# Patient Record
Sex: Female | Born: 1985 | Race: White | Hispanic: No | Marital: Married | Smoking: Never smoker
Health system: Southern US, Community
[De-identification: ages and names within clinical notes are randomized; demographics above are authoritative.]

## PROBLEM LIST (undated history)

## (undated) DIAGNOSIS — G4730 Sleep apnea, unspecified: Secondary | ICD-10-CM

## (undated) DIAGNOSIS — E639 Nutritional deficiency, unspecified: Secondary | ICD-10-CM

## (undated) DIAGNOSIS — F32A Depression, unspecified: Secondary | ICD-10-CM

## (undated) DIAGNOSIS — R7303 Prediabetes: Secondary | ICD-10-CM

## (undated) DIAGNOSIS — F411 Generalized anxiety disorder: Secondary | ICD-10-CM

## (undated) DIAGNOSIS — M1990 Unspecified osteoarthritis, unspecified site: Secondary | ICD-10-CM

## (undated) DIAGNOSIS — M549 Dorsalgia, unspecified: Secondary | ICD-10-CM

## (undated) DIAGNOSIS — Z87820 Personal history of traumatic brain injury: Secondary | ICD-10-CM

## (undated) DIAGNOSIS — K219 Gastro-esophageal reflux disease without esophagitis: Secondary | ICD-10-CM

## (undated) DIAGNOSIS — G40B09 Juvenile myoclonic epilepsy, not intractable, without status epilepticus: Secondary | ICD-10-CM

## (undated) DIAGNOSIS — Z8669 Personal history of other diseases of the nervous system and sense organs: Secondary | ICD-10-CM

## (undated) DIAGNOSIS — G43909 Migraine, unspecified, not intractable, without status migrainosus: Secondary | ICD-10-CM

## (undated) DIAGNOSIS — N979 Female infertility, unspecified: Secondary | ICD-10-CM

## (undated) DIAGNOSIS — F909 Attention-deficit hyperactivity disorder, unspecified type: Secondary | ICD-10-CM

## (undated) DIAGNOSIS — I10 Essential (primary) hypertension: Secondary | ICD-10-CM

## (undated) DIAGNOSIS — L659 Nonscarring hair loss, unspecified: Secondary | ICD-10-CM

## (undated) DIAGNOSIS — E282 Polycystic ovarian syndrome: Secondary | ICD-10-CM

## (undated) DIAGNOSIS — M2550 Pain in unspecified joint: Secondary | ICD-10-CM

## (undated) DIAGNOSIS — G40909 Epilepsy, unspecified, not intractable, without status epilepticus: Secondary | ICD-10-CM

## (undated) DIAGNOSIS — F329 Major depressive disorder, single episode, unspecified: Secondary | ICD-10-CM

## (undated) DIAGNOSIS — F419 Anxiety disorder, unspecified: Secondary | ICD-10-CM

## (undated) HISTORY — DX: Attention-deficit hyperactivity disorder, unspecified type: F90.9

## (undated) HISTORY — PX: TONSILLECTOMY: SUR1361

## (undated) HISTORY — DX: Sleep apnea, unspecified: G47.30

## (undated) HISTORY — PX: WISDOM TOOTH EXTRACTION: SHX21

## (undated) HISTORY — DX: Nutritional deficiency, unspecified: E63.9

## (undated) HISTORY — DX: Depression, unspecified: F32.A

## (undated) HISTORY — DX: Dorsalgia, unspecified: M54.9

## (undated) HISTORY — DX: Pain in unspecified joint: M25.50

## (undated) HISTORY — PX: OTHER SURGICAL HISTORY: SHX169

## (undated) HISTORY — DX: Migraine, unspecified, not intractable, without status migrainosus: G43.909

## (undated) HISTORY — DX: Prediabetes: R73.03

## (undated) HISTORY — DX: Female infertility, unspecified: N97.9

## (undated) HISTORY — PX: ROUX-EN-Y GASTRIC BYPASS: SHX1104

## (undated) HISTORY — PX: TYMPANOSTOMY TUBE PLACEMENT: SHX32

---
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
## 2003-10-06 ENCOUNTER — Emergency Department (HOSPITAL_COMMUNITY): Admission: EM | Admit: 2003-10-06 | Discharge: 2003-10-06 | Payer: Self-pay | Admitting: Family Medicine

## 2004-06-21 ENCOUNTER — Emergency Department (HOSPITAL_COMMUNITY): Admission: EM | Admit: 2004-06-21 | Discharge: 2004-06-21 | Payer: Self-pay | Admitting: Family Medicine

## 2004-06-24 ENCOUNTER — Emergency Department (HOSPITAL_COMMUNITY): Admission: EM | Admit: 2004-06-24 | Discharge: 2004-06-24 | Payer: Self-pay | Admitting: Family Medicine

## 2004-08-10 ENCOUNTER — Inpatient Hospital Stay (HOSPITAL_COMMUNITY): Admission: AD | Admit: 2004-08-10 | Discharge: 2004-08-10 | Payer: Self-pay | Admitting: Obstetrics and Gynecology

## 2005-02-05 ENCOUNTER — Ambulatory Visit (HOSPITAL_COMMUNITY): Admission: RE | Admit: 2005-02-05 | Discharge: 2005-02-05 | Payer: Self-pay | Admitting: Family Medicine

## 2006-12-28 ENCOUNTER — Emergency Department (HOSPITAL_COMMUNITY): Admission: EM | Admit: 2006-12-28 | Discharge: 2006-12-28 | Payer: Self-pay | Admitting: Emergency Medicine

## 2007-02-01 ENCOUNTER — Emergency Department (HOSPITAL_COMMUNITY): Admission: EM | Admit: 2007-02-01 | Discharge: 2007-02-01 | Payer: Self-pay | Admitting: Emergency Medicine

## 2007-03-07 ENCOUNTER — Ambulatory Visit: Payer: Self-pay | Admitting: Pulmonary Disease

## 2007-03-22 ENCOUNTER — Ambulatory Visit: Payer: Self-pay | Admitting: Pulmonary Disease

## 2007-03-23 ENCOUNTER — Ambulatory Visit: Payer: Self-pay | Admitting: Cardiology

## 2007-03-30 ENCOUNTER — Ambulatory Visit: Payer: Self-pay | Admitting: Pulmonary Disease

## 2007-04-03 ENCOUNTER — Ambulatory Visit: Payer: Self-pay | Admitting: Pulmonary Disease

## 2007-05-08 DIAGNOSIS — J309 Allergic rhinitis, unspecified: Secondary | ICD-10-CM | POA: Insufficient documentation

## 2007-05-08 DIAGNOSIS — J45909 Unspecified asthma, uncomplicated: Secondary | ICD-10-CM | POA: Insufficient documentation

## 2007-05-08 DIAGNOSIS — I10 Essential (primary) hypertension: Secondary | ICD-10-CM | POA: Insufficient documentation

## 2007-05-08 DIAGNOSIS — K219 Gastro-esophageal reflux disease without esophagitis: Secondary | ICD-10-CM | POA: Insufficient documentation

## 2007-05-09 ENCOUNTER — Ambulatory Visit: Payer: Self-pay | Admitting: Pulmonary Disease

## 2007-05-09 DIAGNOSIS — R059 Cough, unspecified: Secondary | ICD-10-CM | POA: Insufficient documentation

## 2007-05-09 DIAGNOSIS — R05 Cough: Secondary | ICD-10-CM

## 2007-06-04 ENCOUNTER — Telehealth (INDEPENDENT_AMBULATORY_CARE_PROVIDER_SITE_OTHER): Payer: Self-pay | Admitting: *Deleted

## 2007-06-04 ENCOUNTER — Ambulatory Visit: Payer: Self-pay | Admitting: Pulmonary Disease

## 2007-06-04 DIAGNOSIS — J019 Acute sinusitis, unspecified: Secondary | ICD-10-CM | POA: Insufficient documentation

## 2007-06-13 ENCOUNTER — Telehealth (INDEPENDENT_AMBULATORY_CARE_PROVIDER_SITE_OTHER): Payer: Self-pay | Admitting: *Deleted

## 2007-06-18 ENCOUNTER — Ambulatory Visit: Payer: Self-pay | Admitting: Pulmonary Disease

## 2007-07-04 ENCOUNTER — Encounter (INDEPENDENT_AMBULATORY_CARE_PROVIDER_SITE_OTHER): Payer: Self-pay | Admitting: *Deleted

## 2008-03-27 ENCOUNTER — Emergency Department (HOSPITAL_COMMUNITY): Admission: EM | Admit: 2008-03-27 | Discharge: 2008-03-27 | Payer: Self-pay | Admitting: Emergency Medicine

## 2010-04-25 ENCOUNTER — Emergency Department (HOSPITAL_COMMUNITY)
Admission: EM | Admit: 2010-04-25 | Discharge: 2010-04-25 | Payer: Self-pay | Source: Home / Self Care | Admitting: Emergency Medicine

## 2010-06-27 ENCOUNTER — Inpatient Hospital Stay (INDEPENDENT_AMBULATORY_CARE_PROVIDER_SITE_OTHER)
Admission: RE | Admit: 2010-06-27 | Discharge: 2010-06-27 | Disposition: A | Discharge status: Home / Self Care | Payer: Self-pay | Source: Ambulatory Visit | Attending: Emergency Medicine | Admitting: Emergency Medicine

## 2010-06-27 DIAGNOSIS — J029 Acute pharyngitis, unspecified: Secondary | ICD-10-CM

## 2010-06-27 DIAGNOSIS — J40 Bronchitis, not specified as acute or chronic: Secondary | ICD-10-CM

## 2010-07-19 LAB — POCT PREGNANCY, URINE: Preg Test, Ur: NEGATIVE

## 2010-09-21 NOTE — Assessment & Plan Note (Signed)
[name]                             PULMONARY OFFICE NOTE   NAME:[name], Brooke LACKMAN                    MRN:          [id number]  DATE:03/30/2007                            DOB:          1985-05-23    HISTORY OF PRESENT ILLNESS:  Patient is a 25 year old white female  patient of Dr. Evlyn [name] who was recently seen for pulmonary evaluation of  asthma.  Patient was felt to have some rhinitis symptoms.  She was given  a 7-day course of Augmentin and started on Nasacort AQ and Asmanex.  Patient reports her symptoms did improve, however continued to have a  persistent cough.  Patient was sent for a CT scan of the sinuses on  November 14 which was unremarkable.  She had been placed on an  aggressive cough suppression regimen including Mucinex DM and Tramadol.  She was placed on Zyrtec 10 mg at bedtime and Prilosec was increased up  to twice a day.  Patient returns back today reporting that she had  significant improvement in symptoms initially with total resolution of  cough until 2 days ago when cough started to return and now with severe  coughing paroxysms.  Patient complains of some mild stuffy nose and  postnasal drip symptoms.   PAST MEDICAL HISTORY:  Reviewed.   CURRENT MEDICATIONS:  Reviewed.   PHYSICAL EXAMINATION:  Patient is a pleasant female in no acute  distress.  She is afebrile with stable vital signs.  O2 saturation is  96% on room air.  HEENT:  Nasal mucosa is some with some mild erythema, nontender sinuses,  posterior pharynx is clear.  NECK:  Supple without cervical adenopathy.  No JVD.  LUNGS:  Sounds are clear.  CARDIAC:  Regular rate.  ABDOMEN:  Soft and nontender.  EXTREMITIES:  Warm without any edema.   DATA:  Chest x-ray shows as unremarkable.   IMPRESSION AND PLAN:  Recurrent cough with underlying asthma and  rhinitis.  Patient suspected to have some upper airway instability,  possibly secondary to postnasal drip syndrome  and/or reflux.  Patient  will add in Pepcid AC at bedtime, continue on Prilosec twice  daily.  Begin a prednisone taper and continue on aggressive cough  suppression regimen.  Patient will return back in 2 weeks with Dr. Craige [name]  or sooner if needed.      Brooke Oaks, NP  Electronically Signed      Coralyn Helling, MD  Electronically Signed   TP/MedQ  DD: 04/02/2007  DT: 04/02/2007  Job #: [id number]

## 2010-09-21 NOTE — Assessment & Plan Note (Signed)
[facility]                             PULMONARY OFFICE NOTE   NAME:Brooke [name], Brooke [name]                    MRN:          [id number]  DATE:03/07/2007                            DOB:          12-27-1985    I met Ms. Brooke [name] today for evaluation of her asthmatic bronchitis.   She says that her symptoms started in March of 2008 when she developed  bronchitis.  She was prescribed a cough medicine initially, which was  not beneficial.  This progressed to the point where she was seen in an  urgent care center in August.  She was given a course of antibiotics,  which did help clear things up somewhat, but then her symptoms recurred.  She was then seen in September at the urgent care center again.  She was  given another course of antibiotics, prednisone, and albuterol.  She  said that, when she was on the albuterol and the prednisone, her  symptoms improved, and the albuterol seemed to help, but her symptoms  have gotten worse again, and she does not feel like the albuterol is  helping with the symptoms any more.  She currently complains of nasal  congestion, rhinitis, and post-nasal drip.  She is having a yellowish  discharge.  She also complains of a globus sensation in her throat.  She  does also complain of cough to the point where she feels like she is  going to vomit.  She has sputum production, which is greenish in color.  She also complains of wheezing and chest tightness.  She says that she  has been running a low grade temperature of 99.9.  She denies any chills  or sweats.  She denies any hemoptysis.  She has not had any recent sick  exposures.  There is no recent travel history.  She does own a dog and a  cat, but has had these for several years.  She has never had any  problems previously with allergy symptoms.  She denies any problems as  far as taking aspirin.  She does have a history of reflux, and is on  Prilosec for this.  She denies any  joint pain or swelling.  She says  that she will get a reddish rash over her chest occasionally, but this  is intermittent.   PAST MEDICAL HISTORY:  Significant for previous episode of pneumonia,  elevated blood pressure, epilepsy, gastroesophageal reflux disease.   PAST SURGICAL HISTORY:  Significant for a tonsillectomy.  She also had  ear tubes placed when she was a child.   CURRENT MEDICATIONS:  Prilosec.  Lamictal.  Depakote.  Albuterol.   She has no known drug allergies.   SOCIAL HISTORY:  She is single.  She lives with her mother.  She works  as a Biochemist, clinical.  She has no children.  She does have 1 cat and 2  dogs.  She denies any alcohol use or cigarette use.  She says she will  occasionally smoke marijuana, but has not done so since September.  She  denies any other drug use.   FAMILY  HISTORY:  Significant for a mother who has hypertension and  diabetes.   REVIEW OF SYSTEMS:  She complains of feeling depressed due to her health  problems over the last several months.   PHYSICAL EXAM:  She is 5 feet 5 inches tall, 239 pounds.  Temperature is  98.5, blood pressure 140/80, heart rate is 97, oxygen saturation 98% on  room air.  HEENT:  Pupils are reactive.  There is no sinus tenderness.  She has  boggy nasal mucosa with clear to yellowish discharge.  There is mild  erythema to the posterior pharynx.  There is no lymphadenopathy.  No thyromegaly.  HEART:  S1, S2.  Regular rate and rhythm.  CHEST:  She has prolonged expiratory phase with bilateral expiratory  wheezes heard.  ABDOMEN:  Obese, soft, and nontender.  EXTREMITIES:  No cyanosis, clubbing, or edema.  NEUROLOGIC:  No focal deficits appreciated.   IMPRESSION:  1. She has symptoms suggestive of asthma as well as rhinitis, post-      nasal drip, and sinusitis.  I will give her an additional course of      antibiotics with Augmentin 500 mg 1 p.o. b.i.d. for 7 days with 1      refill depending upon her symptom  response.  I will also start her      on Nasacort AQ 2 sprays each nostril once daily as well as nasal      irrigation.  Depending upon her response to this, I will decide if      she will need to have any imaging studies of her sinuses done.  I      will also give her a short course of prednisone over the next 5      days, as well as starting her on Asmanex 220 mcg 2 puffs nightly.      She is also to continue on her albuterol as needed.  I have also      given her a peak flow meter to monitor her symptom status.  She did      have a previous x-ray done in August of 2008, which showed chronic      bronchitic changes, but was otherwise unremarkable.  2. Gastroesophageal reflux disease.  She is to continue on her      Prilosec.  3. Hypertension.  She is to follow up with her primary care physician      for this.  4. Epilepsy.  She is due to follow up with neurology for this.  5. Obesity.  She will need to have further counseling in regard to      this.   I will follow up with her in 3 to 4 weeks.     Coralyn Helling, MD  Electronically Signed    VS/MedQ  DD: 03/07/2007  DT: 03/07/2007  Job #: [id number]   cc:   Deanna Artis. Sharene Skeans, M.D.

## 2010-09-21 NOTE — Assessment & Plan Note (Signed)
[facility]                             PULMONARY OFFICE NOTE   NAME:[name], Brooke ENTERLINE                    MRN:          [id number]  DATE:04/03/2007                            DOB:          13-Feb-1986    I saw Brooke [name] today in followup for her asthma with rhinitis and  reflux.   Since she was last seen by me in October, she has seen my nurse  practitioner twice.  She has had a CT scan of her sinuses, which was  unremarkable.  She also had a chest x-ray which was unremarkable.  She  had been started on several medications to help with her cough, as well  as having her reflux medicines increased.  She was also given another  course of prednisone.  She says that since being started on tramadol,  she has noticed that she has been having more trouble with control of  her seizures.  She does complain of continued problems with nasal  congestion and paroxysmal nocturnal dyspnea.  She is not using her nasal  irrigation as I had prescribed.  She feels like the Symbicort has helped  with her breathing.  She says that she feels like the Zyrtec has also  helped with some of her sinus symptoms as well.   CURRENT MEDICATIONS:  1. Asmanex 2 puffs nightly.  2. Prilosec 20 mg b.i.d.  3. Zyrtec 10 mg nightly.  4. Nasacort AQ 1 spray b.i.d.  5. Lamictal 150 mg in the morning and 200 mg at night.  6. Valproic acid 125 mg b.i.d.  7. Prednisone taper.  8. Pepcid AC nightly.  9. Albuterol as needed.  10.Mucinex DM b.i.d.  11.Tramadol 50 mg as needed.   PHYSICAL EXAM:  She is 234 pounds, temperature is 98.9, blood pressure  is 122/60, heart rate is 100, oxygen saturation is 96% on room air.  HEENT:  There is no sinus tenderness.  She has a clear nasal discharge.  There are no oral lesions.  No lymphadenopathy.  HEART:  S1, S2.  CHEST:  There was no wheezes or rales.  ABDOMEN:  Soft, nontender.  EXTREMITIES:  No edema.   IMPRESSION:  Refractory asthma with  rhinitis and reflux.  What I will do  is have her discontinue her Asmanex and Zyrtec.  I will also have her  discontinue her tramadol.  I will start her Symbicort 160/4.5 two puffs  b.i.d., and I will also start her on Singulair 10 mg nightly.  I will  start her on Astelin 2 sprays in each nostril twice daily.  She is also  to discontinue the use of tramadol and use Delsym once to twice a day.  I will have her continue on her reflux medications with Prilosec and  Pepcid.  She is to complete her tapering course of prednisone.  I would  also have  her continue on her Nasacort and Mucinex.  I will then follow up with  her in approximately 3-4 weeks to see if any further adjustments will  need to be made.     Coralyn Helling, MD  Electronically Signed    VS/MedQ  DD: 04/03/2007  DT: 04/03/2007  Job #: [id number]   cc:   Deanna Artis. Sharene Skeans, M.D.

## 2010-09-21 NOTE — Assessment & Plan Note (Signed)
[facility]                             PULMONARY OFFICE NOTE   NAME:Brooke [name], Brooke [name]                    MRN:          [id number]  DATE:03/22/2007                            DOB:          [birth year]    HISTORY OF PRESENT ILLNESS:  The patient is a 25 year old white female  patient of Dr. Evlyn Courier who was recently seen for evaluation of asthmatic  bronchitis two weeks ago. The patient was suspected to have asthma as  well as rhinitis and sinusitis. The patient was given a seven day course  of Augmentin and started on Nasacort AQ and Asmanex 220. The patient  returns today and reports that she did not have any further symptoms and  has had decreased wheezing, but cough continues. The patient complains  that she has had a cough over the last 8 months. She has been on three  courses of antibiotics and two prednisone tapers. The patient has  recently finished the prednisone taper one week ago. The patient denies  any hemoptysis, orthopnea, PND or leg swelling. 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 She has severe coughing paroxysms throughout the day and at  nighttime. The patient is on reflux medications, but has breakthrough  reflux despite Prilosec daily.   PAST MEDICAL HISTORY:  Reviewed.   CURRENT MEDICATIONS:  Reviewed.   PHYSICAL EXAMINATION:  The patient is a morbidly obese female in no  acute distress. She is afebrile with stable vital signs. O2 saturation  is 95% on room air.  HEENT: Nasal mucosa is erythematous. Nontender sinuses. Conjunctivae not  injected. Tympanic membranes are normal. Posterior pharynx is clear.  NECK: Supple without adenopathy. No JVD.  LUNGS: Lung sounds are clear without any wheezing or crackles.  CARDIAC: Regular rate.  ABDOMEN: Soft, morbidly obese and nontender.  EXTREMITIES: Warm without edema.   IMPRESSION/PLAN:  Chronic cough with underlying  asthma and rhinitis.  Will check a CT of the sinuses to rule out underlying sinus disease. The  patient will begin on Mucinex DM twice daily. Add in tramadol 50 mg  every 4 hours as needed for breakthrough coughing. Add in Zyrtec 10 mg  at bedtime for post-nasal drip symptoms. The patient is to use a cough  suppressant regimen with sugarless candy, non-mint lozenges, ice chips  and water to prevent coughing and will increase her Nasacort AQ up to  one puff twice daily, rinse well after Asmanex. Increase her Prilosec up  to twice a day before breakfast and before dinner. She will return back  here in one  week for followup with Dr. Craige Cotta or sooner if needed. The patient's FMLA  forms were filled out today.      Rubye Oaks, NP  Electronically Signed      Coralyn Helling, MD  Electronically Signed   TP/MedQ  DD: 03/22/2007  DT: 03/22/2007  Job #: [id number]

## 2011-02-08 LAB — URINALYSIS, ROUTINE W REFLEX MICROSCOPIC
Glucose, UA: NEGATIVE
Nitrite: NEGATIVE
Urobilinogen, UA: 0.2

## 2011-02-08 LAB — URINE MICROSCOPIC-ADD ON

## 2011-02-18 LAB — POCT RAPID STREP A: Streptococcus, Group A Screen (Direct): NEGATIVE

## 2011-03-16 ENCOUNTER — Emergency Department (INDEPENDENT_AMBULATORY_CARE_PROVIDER_SITE_OTHER): Payer: Self-pay

## 2011-03-16 ENCOUNTER — Emergency Department (INDEPENDENT_AMBULATORY_CARE_PROVIDER_SITE_OTHER)
Admission: EM | Admit: 2011-03-16 | Discharge: 2011-03-16 | Disposition: A | Discharge status: Home / Self Care | Payer: Self-pay | Source: Home / Self Care | Attending: Family Medicine | Admitting: Family Medicine

## 2011-03-16 ENCOUNTER — Encounter: Payer: Self-pay | Admitting: *Deleted

## 2011-03-16 DIAGNOSIS — N898 Other specified noninflammatory disorders of vagina: Secondary | ICD-10-CM

## 2011-03-16 DIAGNOSIS — R109 Unspecified abdominal pain: Secondary | ICD-10-CM

## 2011-03-16 DIAGNOSIS — N939 Abnormal uterine and vaginal bleeding, unspecified: Secondary | ICD-10-CM

## 2011-03-16 HISTORY — DX: Essential (primary) hypertension: I10

## 2011-03-16 LAB — POCT PREGNANCY, URINE: Preg Test, Ur: NEGATIVE

## 2011-03-16 LAB — POCT URINALYSIS DIP (DEVICE)
Bilirubin Urine: NEGATIVE
Glucose, UA: NEGATIVE mg/dL
Ketones, ur: NEGATIVE mg/dL
Nitrite: NEGATIVE
Protein, ur: NEGATIVE mg/dL
Specific Gravity, Urine: 1.015 (ref 1.005–1.030)
pH: 5.5 (ref 5.0–8.0)

## 2011-03-16 MED ORDER — DICYCLOMINE HCL 20 MG PO TABS: 20.0000 mg | ORAL_TABLET | Freq: Four times a day (QID) | ORAL | Status: DC

## 2011-03-16 NOTE — ED Provider Notes (Signed)
History     CSN: [id number] Arrival date & time: 03/16/2011  2:08 PM   First MD Initiated Contact with Patient 03/16/11 1515      Chief Complaint  Patient presents with  . Abdominal Pain    (Consider location/radiation/quality/duration/timing/severity/associated sxs/prior treatment) HPI Comments: Onset of abd pain one week ago. Pain is intermittent, sharp, generalized and Rt flank. Pain lasts up to 20 minutes each episode. Had a severe episode this afternoon and felt like she might pass out from the pain. Has had nausea, but no vomiting. BMs have been normal until this morning when she had one loose stool. No blood. No dysuira or urinary frequency. On menses this week. Menses irrg. Had one 2 weeks ago also. Bleeding is typical and not heavy. No clots. No vag discharge. Has taken Advil for pain w/o relief.   Patient is a 25 y.o. female presenting with abdominal pain. The history is provided by the patient.  Abdominal Pain The primary symptoms of the illness include abdominal pain, nausea, diarrhea (one loose stool this morning) and vaginal bleeding. The primary symptoms of the illness do not include fever, shortness of breath, vomiting, dysuria or vaginal discharge. The current episode started more than 2 days ago (3 days ago). The onset of the illness was sudden. The problem has not changed since onset. The abdominal pain is generalized (generalized and Rt flank). The abdominal pain is relieved by nothing.  Vaginal bleeding was first noticed more than 2 days ago. Vaginal bleeding other than menses is a recurrent problem. Vaginal bleeding is unchanged since it began. The quantity of blood was typical of menses.  The patient states that she believes she is currently not pregnant.    Past Medical History  Diagnosis Date  . Hypertension     Past Surgical History  Procedure Date  . Tonsillectomy     Family History  Problem Relation Age of Onset  . Diabetes Mother   . Heart failure  Mother   . Hypertension Mother   . Hypertension Father     History  Substance Use Topics  . Smoking status: Never Smoker   . Smokeless tobacco: Not on file  . Alcohol Use: No    OB History    Grav Para Term Preterm Abortions TAB SAB Ect Mult Living                  Review of Systems  Constitutional: Negative for fever.  Respiratory: Negative for shortness of breath.   Gastrointestinal: Positive for nausea, abdominal pain and diarrhea (one loose stool this morning). Negative for vomiting.  Genitourinary: Positive for vaginal bleeding. Negative for dysuria and vaginal discharge.    Allergies  Codeine  Home Medications   Current Outpatient Rx  Name Route Sig Dispense Refill  . SPIRONOLACTONE 25 MG PO TABS Oral Take 25 mg by mouth daily.        BP 146/88  Pulse 81  Temp(Src) 98.3 F (36.8 C) (Oral)  Resp 20  SpO2 100%  Physical Exam  Nursing note and vitals reviewed. Constitutional: She appears well-developed and well-nourished. No distress.  HENT:  Head: Normocephalic and atraumatic.  Right Ear: Tympanic membrane, external ear and ear canal normal.  Left Ear: Tympanic membrane, external ear and ear canal normal.  Nose: Nose normal.  Mouth/Throat: Oropharynx is clear and moist. No oropharyngeal exudate.  Neck: Neck supple.  Cardiovascular: Normal rate, regular rhythm and normal heart sounds.   Pulmonary/Chest: Effort normal and breath sounds normal.  No respiratory distress.  Abdominal: Soft. Bowel sounds are normal. She exhibits no distension and no mass. There is no tenderness. There is no rebound and no guarding.  Lymphadenopathy:    She has no cervical adenopathy.  Neurological: She is alert. Gait normal.  Skin: Skin is warm and dry.  Psychiatric: She has a normal mood and affect.    ED Course  Procedures (including critical care time)  Labs Reviewed - No data to display No results found.   No diagnosis found.    MDM  Xray neg, reviewed by  myself and radiologist. Increased bowel gas. Urine and preg neg. Discussed with Dr Lorenza Chick.        Melody Comas, PA 03/18/11 [address] [place], [place] 03/18/11 1053

## 2011-03-16 NOTE — ED Notes (Signed)
Pt  Reports  abd pain  Started 3 days  Ago    Pt  Reports  Nausea  Here  And  There       Almost  Passed  Out  At  Work     Pt  Has  Vaginal bleeding    X  2  Days           Slight  Diarrhea     This  Am  No  Vomiting

## 2011-03-16 NOTE — ED Notes (Signed)
Pt  Reports   She  Has  irreg  Periods as well  As  Hair loss     She reports  It is  Being  addresed  Now   By a  pcp

## 2011-03-18 ENCOUNTER — Inpatient Hospital Stay (HOSPITAL_COMMUNITY)
Admission: AD | Admit: 2011-03-18 | Discharge: 2011-03-18 | Disposition: A | Discharge status: Home / Self Care | Payer: Self-pay | Source: Ambulatory Visit | Attending: Obstetrics and Gynecology | Admitting: Obstetrics and Gynecology

## 2011-03-18 ENCOUNTER — Encounter (HOSPITAL_COMMUNITY): Payer: Self-pay | Admitting: *Deleted

## 2011-03-18 DIAGNOSIS — N938 Other specified abnormal uterine and vaginal bleeding: Secondary | ICD-10-CM | POA: Insufficient documentation

## 2011-03-18 DIAGNOSIS — N949 Unspecified condition associated with female genital organs and menstrual cycle: Secondary | ICD-10-CM

## 2011-03-18 HISTORY — DX: Epilepsy, unspecified, not intractable, without status epilepticus: G40.909

## 2011-03-18 LAB — CBC
Hemoglobin: 14.4 g/dL (ref 12.0–15.0)
MCHC: 34.3 g/dL (ref 30.0–36.0)
RBC: 4.8 MIL/uL (ref 3.87–5.11)
RDW: 12.5 % (ref 11.5–15.5)
WBC: 7.4 10*3/uL (ref 4.0–10.5)

## 2011-03-18 LAB — DIFFERENTIAL
Basophils Absolute: 0 10*3/uL (ref 0.0–0.1)
Lymphs Abs: 2.1 10*3/uL (ref 0.7–4.0)
Monocytes Absolute: 0.5 10*3/uL (ref 0.1–1.0)
Monocytes Relative: 6 % (ref 3–12)
Neutrophils Relative %: 64 % (ref 43–77)

## 2011-03-18 MED ORDER — MEDROXYPROGESTERONE ACETATE 5 MG PO TABS: 5.0000 mg | ORAL_TABLET | Freq: Every day | ORAL | Status: DC

## 2011-03-18 NOTE — Progress Notes (Signed)
Pt started spotting 2 days ago, was seen @ [facility] for abd pain, yesterday was bleeding like a regular period, then this a.m. @ 0430 began having heavy bleeding.

## 2011-03-18 NOTE — Progress Notes (Signed)
Pt states hx dysmenorrhea, started bleeding this am at around ? 0430 am, changing super tampon q106minutes. Denies vag d/c changes. Lower abd cramping into back bilaterally.

## 2011-03-18 NOTE — ED Provider Notes (Signed)
History     Chief Complaint  Patient presents with  . Vaginal Bleeding   HPICharity L Riggins25 y.o. previous patient of Dr. Senaida Ores presents with heavy vaginal bleeding.  Began 2 days ago light, more yesterday and today at 4:30am began with cramps and heavy bleeding with clots, changing tampons q64m. Cannot recall LMP.   Hx of irregular bleeding sometimes spotting for a month.  Denies PCOS, has hx of elevated testosterone.  5 years ago was treated with Yaz but had abdominal pain unrelated to the bleeding so she discontinued. G0. 1 sexual partner.  Called Dr. Ambrose Mantle this am and instructed to come here.   Plans to return to practice for care.  Was seen by [facility] [place] [facility] 2 days ago with abdominal pain, different that pain today.  UA there was negative.  Hx Hypertension, untreated by her report but is on Spirolactone from Dr. Terri Piedra, dermatologist and told that would control it , elevated BP today. Hx of seizures (last seizure 3 weeks ago) without medication.  States she controls them by avoiding flickering lights and getting enough sleep.  No longer sees Dr. Sharene Skeans.     Past Medical History  Diagnosis Date  . Hypertension   . Epilepsy     Last seizure 3 weeks ago    Past Surgical History  Procedure Date  . Tonsillectomy     Family History  Problem Relation Age of Onset  . Diabetes Mother   . Heart failure Mother   . Hypertension Mother   . Hypertension Father     History  Substance Use Topics  . Smoking status: Never Smoker   . Smokeless tobacco: Not on file  . Alcohol Use: No    Allergies:  Allergies  Allergen Reactions  . Codeine Itching    Prescriptions prior to admission  Medication Sig Dispense Refill  . Biotin 1000 MCG tablet Take 1,000 mcg by mouth at bedtime as needed and may repeat dose one time if needed.        . dicyclomine (BENTYL) 20 MG tablet Take 1 tablet (20 mg total) by mouth every 6 (six) hours.  20 tablet  0  . ibuprofen (ADVIL,MOTRIN) 200 MG tablet Take  400 mg by mouth daily as needed. For pain       . Ibuprofen (MIDOL) 200 MG CAPS Take 2 capsules by mouth once.        Marland Kitchen omeprazole (PRILOSEC) 20 MG capsule Take 20 mg by mouth daily as needed. For heartburn       . spironolactone (ALDACTONE) 25 MG tablet Take 25 mg by mouth daily.          Review of Systems  Constitutional: Negative.   HENT: Negative.   Gastrointestinal: Positive for abdominal pain (cramping).  Genitourinary:       Heavy vaginal bleeding with clots   Physical Exam   Blood pressure 153/100, pulse 78, temperature 98.8 F (37.1 C), temperature source Oral, resp. rate 16, height 5\' 5"  (1.651 m), weight 231 lb 2 oz (104.838 kg), last menstrual period 03/18/2011, SpO2 97.00%.  Physical Exam  Nursing note and vitals reviewed. Constitutional: She is oriented to person, place, and time. She appears well-developed.  HENT:  Head: Normocephalic.  Neck: Normal range of motion.  Respiratory: Effort normal.  GI: Soft. She exhibits no distension and no mass. There is no tenderness. There is no rebound and no guarding.  Genitourinary: Uterus normal. Uterus is not tender. Right adnexum displays no mass, no tenderness and no  fullness. Left adnexum displays no mass, no tenderness and no fullness. There is bleeding (mod amount of bright red blood with several small clots) around the vagina.  Neurological: She is alert and oriented to person, place, and time.  Skin: Skin is warm and dry.   Results for orders placed during the hospital encounter of 03/18/11 (from the past 24 hour(s))  POCT PREGNANCY, URINE     Status: Normal   Collection Time   03/18/11  7:54 AM      Component Value Range   Preg Test, Ur NEGATIVE    WET PREP, GENITAL     Status: Abnormal   Collection Time   03/18/11  8:15 AM      Component Value Range   Yeast, Wet Prep NONE SEEN  NONE SEEN    Trich, Wet Prep NONE SEEN  NONE SEEN    Clue Cells, Wet Prep NONE SEEN  NONE SEEN    WBC, Wet Prep HPF POC FEW (*) NONE SEEN    CBC     Status: Normal   Collection Time   03/18/11  8:17 AM      Component Value Range   WBC 7.4  4.0 - 10.5 (K/uL)   RBC 4.80  3.87 - 5.11 (MIL/uL)   Hemoglobin 14.4  12.0 - 15.0 (g/dL)   HCT 84.6  96.2 - 95.2 (%)   MCV 87.5  78.0 - 100.0 (fL)   MCH 30.0  26.0 - 34.0 (pg)   MCHC 34.3  30.0 - 36.0 (g/dL)   RDW 84.1  32.4 - 40.1 (%)   Platelets 295  150 - 400 (K/uL)  DIFFERENTIAL     Status: Normal   Collection Time   03/18/11  8:17 AM      Component Value Range   Neutrophils Relative 64  43 - 77 (%)   Neutro Abs 4.7  1.7 - 7.7 (K/uL)   Lymphocytes Relative 28  12 - 46 (%)   Lymphs Abs 2.1  0.7 - 4.0 (K/uL)   Monocytes Relative 6  3 - 12 (%)   Monocytes Absolute 0.5  0.1 - 1.0 (K/uL)   Eosinophils Relative 2  0 - 5 (%)   Eosinophils Absolute 0.1  0.0 - 0.7 (K/uL)   Basophils Relative 0  0 - 1 (%)   Basophils Absolute 0.0  0.0 - 0.1 (K/uL)   MAU Course  Procedures  GC/CHL cultures to lab  MDM 9:35am paged Dr. Senaida Ores to report.  She returned the call at 9:52.  Reported patient's hx, sxs, pelvic exam and lab results.  Order given to offer patient choice of this period ending on its own vs Provera 10mg  daily X 5-7 days.  Call for appointment for followup.    I gave the patient the choice and she would like to take the Provera.  She will call for appt.  I gave her explanation of irregular bleeding, need to have periods q50months to avoid hyperplasia.  Informed that Dr. Senaida Ores will discuss at followup visit.   Assessment and Plan  A: Dysfunctional Uterine Bleeding.  P: Provera 5mg  qd X 5-7 days     Instructed patient to followup with Dr. Senaida Ores.  Brooke [name],EVE M 03/18/2011, 8:01 AM   Matt Holmes, NP 03/18/11 1012

## 2011-03-19 LAB — GC/CHLAMYDIA PROBE AMP, GENITAL: Chlamydia, DNA Probe: NEGATIVE

## 2011-03-21 NOTE — ED Provider Notes (Signed)
Medical screening examination/treatment/procedure(s) were performed by non-physician practitioner and as supervising physician I was immediately available for consultation/collaboration.  LYKINS,KIMBERLY G  D.O.    Randa Spike, MD 03/21/11 [phone]

## 2011-04-22 ENCOUNTER — Telehealth: Payer: Self-pay | Admitting: Neurology

## 2011-04-22 NOTE — Telephone Encounter (Signed)
12:00 Jan 3

## 2011-04-22 NOTE — Telephone Encounter (Signed)
Pt scheduled. Will call to cx appt if something is found sooner somewhere else.

## 2011-04-22 NOTE — Telephone Encounter (Signed)
Pt has appt scheduled on 06/03/2011 but her new job won't let her start work until she visits a neurologist because of her epilepsy. Is there anywhere we can fit her in ASAP?

## 2011-05-12 ENCOUNTER — Ambulatory Visit (INDEPENDENT_AMBULATORY_CARE_PROVIDER_SITE_OTHER): Payer: Self-pay | Admitting: Neurology

## 2011-05-12 ENCOUNTER — Encounter: Payer: Self-pay | Admitting: Neurology

## 2011-05-12 VITALS — BP 120/80 | HR 92 | Ht 65.0 in | Wt 231.0 lb

## 2011-05-12 DIAGNOSIS — G40309 Generalized idiopathic epilepsy and epileptic syndromes, not intractable, without status epilepticus: Secondary | ICD-10-CM

## 2011-05-12 DIAGNOSIS — G40B09 Juvenile myoclonic epilepsy, not intractable, without status epilepticus: Secondary | ICD-10-CM | POA: Insufficient documentation
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
## 2011-06-03 ENCOUNTER — Ambulatory Visit: Payer: Self-pay | Admitting: Neurology

## 2011-08-03 ENCOUNTER — Emergency Department (INDEPENDENT_AMBULATORY_CARE_PROVIDER_SITE_OTHER)
Admission: EM | Admit: 2011-08-03 | Discharge: 2011-08-03 | Disposition: A | Discharge status: Home / Self Care | Payer: 59 | Source: Home / Self Care | Attending: Family Medicine | Admitting: Family Medicine

## 2011-08-03 ENCOUNTER — Encounter (HOSPITAL_COMMUNITY): Payer: Self-pay | Admitting: *Deleted

## 2011-08-03 DIAGNOSIS — A084 Viral intestinal infection, unspecified: Secondary | ICD-10-CM

## 2011-08-03 DIAGNOSIS — A09 Infectious gastroenteritis and colitis, unspecified: Secondary | ICD-10-CM

## 2011-08-03 HISTORY — DX: Polycystic ovarian syndrome: E28.2

## 2011-08-03 MED ORDER — ONDANSETRON 4 MG PO TBDP
ORAL_TABLET | ORAL | Status: AC
  Filled 2011-08-03: qty 1

## 2011-08-03 MED ORDER — ONDANSETRON 4 MG PO TBDP
4.0000 mg | ORAL_TABLET | Freq: Once | ORAL | Status: AC
  Administered 2011-08-03: 4 mg via ORAL

## 2011-08-03 MED ORDER — ONDANSETRON HCL 8 MG PO TABS: 8.0000 mg | ORAL_TABLET | Freq: Three times a day (TID) | ORAL | Status: AC | PRN

## 2011-08-03 NOTE — Discharge Instructions (Signed)
Take medications (ondansetron for nausea) as instructed. Consume clear liquids such as water, Sprite, gingerale, light juices for the next 12 to 24 hours, then advance as tolerated to [employer] (bananas, rice, applesauce, toast) and bland things such as soup, crackers, etc. Stop taking medications and return if any blood in stool or any fevers, or you are unable to eat or drink.

## 2011-08-03 NOTE — ED Notes (Signed)
Pt with c/o flu like symptoms - diarrhea Sunday resolved continues with aching all over/nausea/chills/fever - onset of symptoms Sunday

## 2011-08-03 NOTE — ED Provider Notes (Signed)
History     CSN: [id number]  Arrival date & time 08/03/11  1534   First MD Initiated Contact with Patient 08/03/11 1604      Chief Complaint  Patient presents with  . Nausea  . Generalized Body Aches  . Fever    (Consider location/radiation/quality/duration/timing/severity/associated sxs/prior treatment) HPI Comments: Brooke [name] presents for evaluation of body aches, persistent, vomiting, and low-grade fever. She reports several episodes of diarrhea on Sunday that has since resolved. She is tolerating by mouth well. She also reports, body aches, as well. She reports sick contacts at her job.   Patient is a 26 y.o. female presenting with vomiting. The history is provided by the patient.  Emesis  This is a new problem. The current episode started 2 days ago. The problem occurs 5 to 10 times per day. The problem has not changed since onset.The maximum temperature recorded prior to her arrival was 100 to 100.9 F. Associated symptoms include abdominal pain, chills, diarrhea, a fever and myalgias.    Past Medical History  Diagnosis Date  . Hypertension   . Epilepsy     Last seizure 3 weeks ago  . Polycystic disease, ovaries     Past Surgical History  Procedure Date  . Tonsillectomy     Family History  Problem Relation Age of Onset  . Diabetes Mother   . Heart failure Mother   . Hypertension Mother   . Hypertension Father     History  Substance Use Topics  . Smoking status: Never Smoker   . Smokeless tobacco: Never Used  . Alcohol Use: No    OB History    Grav Para Term Preterm Abortions TAB SAB Ect Mult Living   0               Review of Systems  Constitutional: Positive for fever and chills.  HENT: Negative.   Eyes: Negative.   Respiratory: Negative.   Cardiovascular: Negative.   Gastrointestinal: Positive for vomiting, abdominal pain and diarrhea.  Genitourinary: Negative.   Musculoskeletal: Positive for myalgias.  Skin: Negative.   Neurological: Negative.      Allergies  Codeine  Home Medications   Current Outpatient Rx  Name Route Sig Dispense Refill  . BIOTIN 1000 MCG PO TABS Oral Take 1,000 mcg by mouth at bedtime as needed and may repeat dose one time if needed.      . IBUPROFEN 200 MG PO TABS Oral Take 400 mg by mouth daily as needed. For pain     . OMEPRAZOLE 20 MG PO CPDR Oral Take 20 mg by mouth daily as needed. For heartburn     . PRENATE ELITE 90-600-400 MG-MCG-MCG PO TABS Oral Take 1 tablet by mouth daily.      Marland Kitchen DICYCLOMINE HCL 20 MG PO TABS Oral Take 1 tablet (20 mg total) by mouth every 6 (six) hours. 20 tablet 0  . IBUPROFEN 200 MG PO CAPS Oral Take 2 capsules by mouth once.      Marland Kitchen MEDROXYPROGESTERONE ACETATE 5 MG PO TABS Oral Take 1 tablet (5 mg total) by mouth daily. 7 tablet 0  . ONDANSETRON HCL 8 MG PO TABS Oral Take 1 tablet (8 mg total) by mouth every 8 (eight) hours as needed for nausea. 15 tablet 0  . SPIRONOLACTONE 25 MG PO TABS Oral Take 25 mg by mouth daily.        BP 139/90  Pulse 97  Temp(Src) 100.6 F (38.1 C) (Oral)  Resp 18  SpO2 96%  LMP 06/15/2011  Physical Exam  Nursing note and vitals reviewed. Constitutional: She is oriented to person, place, and time. She appears well-developed and well-nourished.  HENT:  Head: Normocephalic and atraumatic.  Eyes: EOM are normal.  Neck: Normal range of motion.  Cardiovascular: Normal rate and regular rhythm.   Pulmonary/Chest: Effort normal and breath sounds normal. She has no decreased breath sounds. She has no wheezes. She has no rhonchi.  Abdominal: Soft. Normal appearance and bowel sounds are normal. There is tenderness in the left upper quadrant.  Musculoskeletal: Normal range of motion.  Neurological: She is alert and oriented to person, place, and time.  Skin: Skin is warm and dry.  Psychiatric: Her behavior is normal.    ED Course  Procedures (including critical care time)  Labs Reviewed - No data to display No results found.   1. Viral  gastroenteritis       MDM  She was given 8 milligrams ondansetron [facility] clinic. This was given is 2 doses of 4 mg. She is still nauseous after the first 4 mg. Therefore, second dose was administered. After this. She felt well and tolerated by mouth. She's given a prescription for home.         Renaee Munda, MD 08/03/11 2159

## 2011-11-14 ENCOUNTER — Encounter (HOSPITAL_COMMUNITY): Payer: Self-pay | Admitting: Emergency Medicine

## 2011-11-14 ENCOUNTER — Emergency Department (HOSPITAL_COMMUNITY): Payer: 59

## 2011-11-14 ENCOUNTER — Emergency Department (HOSPITAL_COMMUNITY)
Admission: EM | Admit: 2011-11-14 | Discharge: 2011-11-14 | Disposition: A | Discharge status: Home / Self Care | Payer: 59 | Attending: Emergency Medicine | Admitting: Emergency Medicine

## 2011-11-14 DIAGNOSIS — R51 Headache: Secondary | ICD-10-CM

## 2011-11-14 DIAGNOSIS — R1110 Vomiting, unspecified: Secondary | ICD-10-CM | POA: Insufficient documentation

## 2011-11-14 MED ORDER — METOCLOPRAMIDE HCL 5 MG/ML IJ SOLN
10.0000 mg | Freq: Once | INTRAMUSCULAR | Status: AC
  Administered 2011-11-14: 10 mg via INTRAMUSCULAR
  Filled 2011-11-14: qty 2

## 2011-11-14 MED ORDER — ONDANSETRON HCL 4 MG/2ML IJ SOLN: 4.0000 mg | Freq: Once | INTRAMUSCULAR | Status: DC

## 2011-11-14 MED ORDER — DIPHENHYDRAMINE HCL 50 MG/ML IJ SOLN
25.0000 mg | Freq: Once | INTRAMUSCULAR | Status: AC
  Administered 2011-11-14: 25 mg via INTRAMUSCULAR
  Filled 2011-11-14: qty 1

## 2011-11-14 MED ORDER — ONDANSETRON 4 MG PO TBDP
4.0000 mg | ORAL_TABLET | Freq: Once | ORAL | Status: AC
  Administered 2011-11-14: 4 mg via ORAL
  Filled 2011-11-14: qty 1

## 2011-11-14 MED ORDER — SODIUM CHLORIDE 0.9 % IV BOLUS (SEPSIS): 1000.0000 mL | Freq: Once | INTRAVENOUS | Status: DC

## 2011-11-14 MED ORDER — METOCLOPRAMIDE HCL 5 MG/ML IJ SOLN: 10.0000 mg | Freq: Once | INTRAMUSCULAR | Status: DC

## 2011-11-14 MED ORDER — DIPHENHYDRAMINE HCL 50 MG/ML IJ SOLN: 25.0000 mg | Freq: Once | INTRAMUSCULAR | Status: DC

## 2011-11-14 NOTE — ED Notes (Signed)
Pt brought back from triage; pt getting undressed and into a gown at this time; family at bedside 

## 2011-11-14 NOTE — ED Notes (Signed)
First contact with pt. Pt refused IV. MD informed.

## 2011-11-14 NOTE — ED Notes (Signed)
Pt alert x4. NAD at this time.  

## 2011-11-14 NOTE — ED Provider Notes (Signed)
History     CSN: [id number]  Arrival date & time 11/14/11  1424   First MD Initiated Contact with Patient 11/14/11 1535      Chief Complaint  Patient presents with  . Headache    (Consider location/radiation/quality/duration/timing/severity/associated sxs/prior treatment) Patient is a 26 y.o. female presenting with headaches. The history is provided by the patient.  Headache  This is a new problem. The current episode started 1 to 2 hours ago. The problem occurs constantly. The problem has not changed since onset.The headache is associated with bright light and straining. Pain location: diffuse. The quality of the pain is described as throbbing. The pain is at a severity of 10/10. The pain does not radiate. Associated symptoms include vomiting. Pertinent negatives include no fever, no palpitations, no shortness of breath and no nausea. She has tried NSAIDs for the symptoms. The treatment provided no relief.    Past Medical History  Diagnosis Date  . Hypertension   . Epilepsy     Last seizure 3 weeks ago  . Polycystic disease, ovaries     Past Surgical History  Procedure Date  . Tonsillectomy     Family History  Problem Relation Age of Onset  . Diabetes Mother   . Heart failure Mother   . Hypertension Mother   . Hypertension Father     History  Substance Use Topics  . Smoking status: Never Smoker   . Smokeless tobacco: Never Used  . Alcohol Use: No    OB History    Grav Para Term Preterm Abortions TAB SAB Ect Mult Living   0               Review of Systems  Constitutional: Negative for fever, chills, diaphoresis and fatigue.  HENT: Negative for ear pain, congestion, sore throat, facial swelling, mouth sores, trouble swallowing, neck pain and neck stiffness.   Eyes: Negative.   Respiratory: Negative for apnea, cough, chest tightness, shortness of breath and wheezing.   Cardiovascular: Negative for chest pain, palpitations and leg swelling.  Gastrointestinal:  Positive for vomiting. Negative for nausea, abdominal pain, diarrhea and abdominal distention.  Genitourinary: Negative for hematuria, flank pain, vaginal discharge, difficulty urinating and menstrual problem.  Musculoskeletal: Negative for back pain and gait problem.  Skin: Negative for rash and wound.  Neurological: Positive for headaches. Negative for dizziness, tremors, seizures, syncope, facial asymmetry and numbness.  Psychiatric/Behavioral: Negative.   All other systems reviewed and are negative.    Allergies  Codeine  Home Medications   Current Outpatient Rx  Name Route Sig Dispense Refill  . BIOTIN 1000 MCG PO TABS Oral Take 1,000 mcg by mouth at bedtime.     . IBUPROFEN 200 MG PO TABS Oral Take 800 mg by mouth daily as needed. For pain    . NORETHIN-ETH ESTRADIOL-FE 0.8-25 MG-MCG PO CHEW Oral Chew 1 tablet by mouth at bedtime.    . OMEPRAZOLE 20 MG PO CPDR Oral Take 20 mg by mouth daily as needed. For heartburn     . PRENATE ELITE 90-600-400 MG-MCG-MCG PO TABS Oral Take 1 tablet by mouth at bedtime.       BP 147/97  Pulse 82  Temp 98.6 F (37 C) (Oral)  Resp 18  SpO2 97%  Physical Exam  Nursing note and vitals reviewed. Constitutional: She is oriented to person, place, and time. She appears well-developed and well-nourished. No distress.  HENT:  Head: Normocephalic and atraumatic.  Right Ear: External ear normal.  Left  Ear: External ear normal.  Nose: Nose normal.  Mouth/Throat: Oropharynx is clear and moist. No oropharyngeal exudate.  Eyes: Conjunctivae and EOM are normal. Pupils are equal, round, and reactive to light. Right eye exhibits no discharge. Left eye exhibits no discharge.  Neck: Normal range of motion. Neck supple. No JVD present. No tracheal deviation present. No thyromegaly present.  Cardiovascular: Normal rate, regular rhythm, normal heart sounds and intact distal pulses.  Exam reveals no gallop and no friction rub.   No murmur  heard. Pulmonary/Chest: Effort normal and breath sounds normal. No respiratory distress. She has no wheezes. She has no rales. She exhibits no tenderness.  Abdominal: Soft. Bowel sounds are normal. She exhibits no distension. There is no tenderness. There is no rebound and no guarding.  Musculoskeletal: Normal range of motion.  Lymphadenopathy:    She has no cervical adenopathy.  Neurological: She is alert and oriented to person, place, and time. No cranial nerve deficit or sensory deficit. Coordination normal. GCS eye subscore is 4. GCS verbal subscore is 5. GCS motor subscore is 6.  Skin: Skin is warm. No rash noted. She is not diaphoretic.  Psychiatric: She has a normal mood and affect. Her behavior is normal. Judgment and thought content normal.    ED Course  Procedures (including critical care time)  Labs Reviewed - No data to display No results found.   No diagnosis found.    MDM  26 year old female patient with past medical history of hypertension he does not currently take any medications presents with headache. Patient says she does not routinely get headaches. Patient says that she was taking a nap this afternoon approximately 2 hours before coming to the emergency department and was awoken with severe 10 out of 10 diffuse headache. Patient says headache is worse with light sound and with change of position. She says that she's also had 3 episodes of emesis. Patient's says that she's taken and says it has not helped. Patient currently only takes birth control pills. Physical exam is normal as above with no cranial nerve deficit sensory deficits or motor deficits. Given the rapidity of onset the newness of her headache and the intensity will get imaging of her head we'll also give her a headache cocktail to help treat symptoms.      CT Head Wo Contrast (Final result)   Result time:11/14/11 1658    Final result by Rad Results In Interface (11/14/11 16:58:02)    Narrative:    *RADIOLOGY REPORT*  Clinical Data: Worst headache of life.  CT HEAD WITHOUT CONTRAST  Technique: Contiguous axial images were obtained from the base of the skull through the vertex without contrast  Comparison: None.  Findings: The brain has a normal appearance without evidence for hemorrhage, acute infarction, hydrocephalus, or mass lesion. There is no extra axial fluid collection. The skull and paranasal sinuses are normal.  IMPRESSION: Normal CT of the head without contrast.  Original Report Authenticated By: Elsie Stain, M.D.   Patient now asymptomatic after HA cocktail with no meningismus and normal imaging. Patient to follow up with her neuorlogist.  Case discussed with Dr.Delo        Sherryl Manges, MD 11/14/11 [id number]

## 2011-11-14 NOTE — ED Notes (Signed)
Pt. Stated, I feel like I'm going to throw up. Given a green bag

## 2011-11-15 NOTE — ED Provider Notes (Signed)
I saw and evaluated the patient, reviewed the resident's note and I agree with the findings and plan.  I saw the patient along with Dr. Lew Dawes.  She presents complaining of sudden onset of headache earlier this afternoon while taking a nap.  She denies any trauma, fever, visual changes.  She obtained no relief with otc meds.  On exam, vitals are stable and the patient is afebrile.  She appears very well and in no distress.  The heart and lung exam are unremarkable.  The neurological exam is non-focal as well.  There are no cranial nerve deficits and no papilledema on fundoscopic exam.  A ct of the head was obtained due to the nature of the symptoms and was unremarkable.  She was given medications and had near complete resolution.  She will be discharged to home.  I doubt any emergent pathology and there is no indication for an LP at this point.    Geoffery Lyons, MD 11/15/11 1325

## 2011-11-29 ENCOUNTER — Encounter: Payer: Self-pay | Admitting: Neurology

## 2011-11-29 ENCOUNTER — Ambulatory Visit (INDEPENDENT_AMBULATORY_CARE_PROVIDER_SITE_OTHER): Payer: 59 | Admitting: Neurology

## 2011-11-29 VITALS — BP 128/82 | HR 76 | Wt 233.0 lb

## 2011-11-29 DIAGNOSIS — R51 Headache: Secondary | ICD-10-CM

## 2011-11-29 DIAGNOSIS — R569 Unspecified convulsions: Secondary | ICD-10-CM

## 2011-11-29 MED ORDER — SUMATRIPTAN SUCCINATE 100 MG PO TABS: 100.0000 mg | ORAL_TABLET | Freq: Once | ORAL | Status: DC | PRN

## 2011-11-29 MED ORDER — TOPIRAMATE 25 MG PO TABS: 100.0000 mg | ORAL_TABLET | Freq: Two times a day (BID) | ORAL | Status: DC

## 2011-11-29 NOTE — Patient Instructions (Addendum)
Your CTA head is scheduled for Friday, July 26 at 10:30am.   Please arrive to [facility], first floor admitting by 10:15am.  Nothing to eat or drink 6:30am.  [phone].  We will call you with your EEG appointment to be done at [facility].  [phone].  Titration to Topamax(generic name - topiramate) 100mg  twice a day using  25mg  tablets.   Morning Dose Evening Dose  Weeks 1  0 tablets  1 tablet (25mg )  Weeks 2 1 tablet (25mg ) 1 tablet (25mg )  Weeks 3 1 tablet (25mg ) 2 tablets (50mg )  Weeks 4 2 tablets (50mg ) 2 tablets (50mg )    Weeks 5 3 tablets (75mg ) 3 tablets (75mg )  Weeks 6 4 tablets (100mg ) 4 tablets (100mg )  After Week 6 switch to 1 100mg  tablet twice per day. Titration requires 168 25mg  tablets.

## 2011-11-29 NOTE — Progress Notes (Signed)
Dear Dr. 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  As you may recall, she is a 26 y.o. year old female with a history of JME and headaches who I first saw in January of 2013.  She developed seizures at the age of 13.  She has been treated with Depakote(alopecia and weight gain), Lamictal(150/200 - ?ineffective), Keppra(agitation).  She had been off meds for several years because of cost.  At her last clinic visit she did not want to start AEDs because she could control her myoclonic seizures with good sleep habits.  She says that she cannot remember the last seizure she had.  She is continuing to get good sleep.  She is accompanied by her fiance today.  She has had a recent headache that was sudden in onset, pounding with photo, phonophobia and nausea and vomiting.  She says it was immediately at its maximum intensity.  She has a history of what sounds like migraine headaches, photophobia with throbbing pain, but much less intense.  They usually are relieved by ibuprofen.  This more severe headache required an ED visit.  She has gotten three migraines since then of lesser severity.  There is not clear visual aura.  CT head was normal.  An LP was not done.  She is planning on getting pregnant possibly in April of 2014, when they get married.  She has negotiated that her husband and mother will take care of the baby at night.  In the past we spoke about a trial of Topamax to see if this helped her myoclonic seizures before she tried to get pregnant.  In this way, we could stop the Topamax when she is pregnant, but then have an option to use when she is in the post-natal period and sleep deprived to some degree.  Another option would be diazepam.  She was started on OCP 4 months ago for PCOS.  She is now having regular periods.  There  seems to be no connection between her period and the migraine headaches.    Past Medical History  Diagnosis Date  . Hypertension   . Epilepsy     Last seizure 3 weeks ago  . Polycystic disease, ovaries     Past Surgical History  Procedure Date  . Tonsillectomy     History   Social History  . Marital Status: Single    Spouse Name: N/A    Number of Children: N/A  . Years of Education: N/A   Social History Main Topics  . Smoking status: Never Smoker   . Smokeless tobacco: Never Used  . Alcohol Use: No  . Drug Use: No  . Sexually Active: Yes    Birth Control/ Protection: Condom   Other Topics Concern  . None   Social History Narrative  . None    Family History  Problem Relation Age of Onset  . Diabetes Mother   . Heart failure Mother   . Hypertension Mother   . Hypertension Father     Current Outpatient Prescriptions on File Prior to Visit  Medication Sig Dispense Refill  . Biotin 1000 MCG tablet Take 1,000 mcg by mouth at bedtime.       Marland Kitchen ibuprofen (ADVIL,MOTRIN) 200 MG tablet Take 800 mg by mouth daily as needed. For pain      . Norethindrone-Ethinyl Estradiol-Fe (GENERESS FE) 0.8-25  MG-MCG tablet Chew 1 tablet by mouth at bedtime.      Marland Kitchen omeprazole (PRILOSEC) 20 MG capsule Take 20 mg by mouth daily as needed. For heartburn       . Prenat w/o A-FE-DSS-Methfol-FA (PRENATAL MULTIVITAMIN) 90-600-400 MG-MCG-MCG tablet Take 1 tablet by mouth at bedtime.       . SUMAtriptan (IMITREX) 100 MG tablet Take 1 tablet (100 mg total) by mouth once as needed for migraine (may repeat 1 time in 2 hours).  10 tablet  3  . topiramate (TOPAMAX) 25 MG tablet Take 4 tablets (100 mg total) by mouth 2 (two) times daily.  240 tablet  3    Allergies  Allergen Reactions  . Codeine Itching      ROS:  13 systems were reviewed and  are unremarkable.   Examination:  Filed Vitals:   11/29/11 1436  BP: 128/82  Pulse: 76  Weight: 233 lb (105.688 kg)     In general, well  appearing women.  Impression/Recs: 1.  JME - I would like to start her on Topamax and increase to 100 bid.  I am going to get a routine EEG for my records.  She is going to try to liberalize her sleep schedule and see if the Topamax is effective at preventing her myoclonus.  We may have to increase its dose if tolerated(it should be kept in mind that there is little data to support is use in myoclonus).  My second line would be clonazepam. 2.  Sudden onset headache - I believe this was just a severe migraine, but I will get a CTA to assess for aneurysm.  It is too late to do an LP at this point. 3.  Pregnancy prevention - she will continue to use a condom and OCP while taking the Topamax.   We will see the patient back in 2 months..  Thank you for having [place] see Brooke [name] in consultation.  Feel free to contact me with any questions.  Lupita Raider Modesto Charon, MD [facility], [place] [address] [place], [place] [address] Phone: [phone] Fax: [phone].

## 2011-12-02 ENCOUNTER — Other Ambulatory Visit (HOSPITAL_COMMUNITY): Payer: 59

## 2011-12-09 ENCOUNTER — Ambulatory Visit (HOSPITAL_COMMUNITY)
Admission: RE | Admit: 2011-12-09 | Discharge: 2011-12-09 | Disposition: A | Discharge status: Home / Self Care | Payer: 59 | Source: Ambulatory Visit | Attending: Neurology | Admitting: Neurology

## 2011-12-09 DIAGNOSIS — R51 Headache: Secondary | ICD-10-CM | POA: Insufficient documentation

## 2011-12-09 MED ORDER — IOHEXOL 350 MG/ML SOLN
50.0000 mL | Freq: Once | INTRAVENOUS | Status: AC | PRN
  Administered 2011-12-09: 50 mL via INTRAVENOUS

## 2011-12-12 ENCOUNTER — Telehealth: Payer: Self-pay | Admitting: Neurology

## 2011-12-12 NOTE — Telephone Encounter (Signed)
Spoke with the patient. Information given as per Dr. Modesto Charon below. No questions or concerns voiced at this time.

## 2011-12-12 NOTE — Telephone Encounter (Signed)
Message copied by Benay Spice on Mon Dec 12, 2011  9:39 AM ------      Message from: Milas Gain      Created: Mon Dec 12, 2011  5:34 AM       Let Brooke [name] know that her CTA looked normal.  No sign of aneurysm.

## 2011-12-14 ENCOUNTER — Ambulatory Visit (HOSPITAL_COMMUNITY)
Admission: RE | Admit: 2011-12-14 | Discharge: 2011-12-14 | Disposition: A | Discharge status: Home / Self Care | Payer: 59 | Source: Ambulatory Visit | Attending: Neurology | Admitting: Neurology

## 2011-12-14 DIAGNOSIS — R569 Unspecified convulsions: Secondary | ICD-10-CM | POA: Insufficient documentation

## 2011-12-14 DIAGNOSIS — R9401 Abnormal electroencephalogram [EEG]: Secondary | ICD-10-CM | POA: Insufficient documentation

## 2011-12-14 NOTE — Progress Notes (Signed)
EEG COMPLETED

## 2011-12-15 ENCOUNTER — Telehealth: Payer: Self-pay | Admitting: Neurology

## 2011-12-15 NOTE — Telephone Encounter (Signed)
Spoke with the patient. She reports numbness/tingling bil feet and hands as well as a 4 hour episode yesterday of lip drawing and let eye twitching. The patient thought she was having a "mini stroke." She states it went away and that she is feeling fine today. She also states that she had not gotten much sleep in the past couple of nights as she was dog sitting for a friend. She slept well last night and is doing fine today. I told her the numbness/tingling was a common side effect of the Topamax but that I would check with Dr. Modesto Charon about the drawing of the face and the eye twitching (which has resolved). She can be reached at [phone] after 1200 noon today if we have anything to report. I did advise her to continue the Topamax as directed. **Dr. Modesto Charon, please advise.

## 2011-12-15 NOTE — Telephone Encounter (Signed)
Pt would like results of EEG and she would also like to discuss issues with tingling and facial spasms. She will be leaving to go out of town at 12 pm today and won't be back until next week. Please call at number listed ASAP.

## 2011-12-16 NOTE — Telephone Encounter (Signed)
Let's just keep an eye on it.  Likely related to Topamax as you said.

## 2011-12-16 NOTE — Telephone Encounter (Signed)
Left a message for the patient as stated by Dr. Modesto Charon below. Advised to call if worsening or additional sx.

## 2011-12-19 ENCOUNTER — Telehealth: Payer: Self-pay | Admitting: Neurology

## 2011-12-19 NOTE — Procedures (Signed)
EEG# [id number]  This routine EEG was requested in this 26 year old woman with a history of myoclonic seizures since age 22 and probable JME.  She has just started Topamax.  The EEG was done with the patient awake and drowsy.  During periods of maximal wakefulness she had a 10-11 cps alpha rhythm that was symmetric.  Background activities were composed of low amplitude beta activities that were frontally dominant and symmetric.  There were also at least two poorly formed generalized spike and wave discharges that appeared to be occipitally dominant.  One was lateralized to the left.  Photic stimulation did not provoke a driving response.  Hyperventilation for 3 minutes with good effort did not markedly change the tracing.  The patient did become drowsy as characterized by an attenuation of muscle activity as well as the alpha rhythm.  Clinical Interpretation:  This routine EEG done with the patient awake and drowsy is abnormal.  Diffuse poorly formed generalized discharges are consistent with a generalized epilepsy.  Brooke [name] Modesto Charon, MD [facility], [place]

## 2011-12-19 NOTE — Telephone Encounter (Signed)
Pt would like results of EEG.

## 2011-12-19 NOTE — Telephone Encounter (Signed)
Spoke with the patient. Information given as per Dr. Fabio Bering below. Pt will f/u in September. Is aware of our new address.

## 2011-12-19 NOTE — Telephone Encounter (Signed)
Jan - Let her know that her EEG showed some abnormalities consistent with her type of epilepsy.  However, these were small abnormalities and her EEG was closed to being normal.

## 2012-01-13 ENCOUNTER — Ambulatory Visit (INDEPENDENT_AMBULATORY_CARE_PROVIDER_SITE_OTHER): Payer: 59 | Admitting: Neurology

## 2012-01-13 ENCOUNTER — Encounter: Payer: Self-pay | Admitting: Neurology

## 2012-01-13 VITALS — BP 110/80 | HR 62 | Wt 214.0 lb

## 2012-01-13 DIAGNOSIS — G40B09 Juvenile myoclonic epilepsy, not intractable, without status epilepticus: Secondary | ICD-10-CM

## 2012-01-13 DIAGNOSIS — G40309 Generalized idiopathic epilepsy and epileptic syndromes, not intractable, without status epilepticus: Secondary | ICD-10-CM

## 2012-01-13 MED ORDER — TOPIRAMATE 100 MG PO TABS: 100.0000 mg | ORAL_TABLET | Freq: Two times a day (BID) | ORAL | Status: DC

## 2012-01-13 NOTE — Progress Notes (Signed)
Dear Dr. 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 As you may recall, she is a 26 y.o. year old female with a history of JME and headaches who I first saw in January of 2013. She developed seizures at the age of 34. She has been treated with Depakote(alopecia and weight gain), Lamictal(150/200 - ?ineffective), Keppra(agitation).  She had been off meds for several years because of cost. At her last clinic visit she did not want to start AEDs because she could control her myoclonic seizures with good sleep habits. She says that she cannot remember the last seizure she had. She is continuing to get good sleep.  She is accompanied by her fiance today. She has had a recent headache that was sudden in onset, pounding with photo, phonophobia and nausea and vomiting. She says it was immediately at its maximum intensity. She has a history of what sounds like migraine headaches, photophobia with throbbing pain, but much less intense. They usually are relieved by ibuprofen. This more severe headache required an ED visit. She has gotten three migraines since then of lesser severity. There is not clear visual aura. CT head was normal. An LP was not done.  She is planning on getting pregnant possibly in April of 2014, when they get married. She has negotiated that her husband and mother will take care of the baby at night. In the past we spoke about a trial of Topamax to see if this helped her myoclonic seizures before she tried to get pregnant. In this way, we could stop the Topamax when she is pregnant, but then have an option to use when she is in the post-natal period and sleep deprived to some degree. Another option would be diazepam.  She was started on OCP 4 months ago for PCOS. She is now having regular periods. There seems to be no connection between  her period and the migraine headaches.  -------------------------------------------  Her last visit is outlined above.  I started her on the Topamax in order to investigate whether it would protect her from myoclonic seizures if she was sleep deprived.  In this way we could determine if she could take Topamax after her pregnancy when she is likely to be sleep deprived and have myoclonic seizures.  She says the Topamax has stopped her from having seizures if she stays up late, or does not get enough sleep.  She has had no further migraines, although sometimes feels one coming on around her cycle.  I did get a CTA of her head to look for aneurysm because of her severe sudden onset of headache but it was unremarkable.    She does feel the Topamax gives her tingling of the face and hands.  Sometimes if she is sleep deprived she gets twitching of the eye lid.  She is trying to lose weight and has last 20lbs so far.  She is unsure about wanting to have a baby soon after her wedding in April 2014 as she would like to try to lose more weight before she has a baby.  She is using both OCP and barrier method(condom).  Recent routine EEG did reveal poorly form generalized spike and wave discharges.   Medical history, social history, and family history were reviewed and have not changed since the last clinic visit.  Current Outpatient Prescriptions on File Prior to  Visit  Medication Sig Dispense Refill  . Biotin 1000 MCG tablet Take 1,000 mcg by mouth at bedtime.       Marland Kitchen ibuprofen (ADVIL,MOTRIN) 200 MG tablet Take 800 mg by mouth daily as needed. For pain      . Norethindrone-Ethinyl Estradiol-Fe (GENERESS FE) 0.8-25 MG-MCG tablet Chew 1 tablet by mouth at bedtime.      Marland Kitchen omeprazole (PRILOSEC) 20 MG capsule Take 20 mg by mouth daily as needed. For heartburn       . Prenat w/o A-FE-DSS-Methfol-FA (PRENATAL MULTIVITAMIN) 90-600-400 MG-MCG-MCG tablet Take 1 tablet by mouth at bedtime.       . SUMAtriptan  (IMITREX) 100 MG tablet Take 1 tablet (100 mg total) by mouth once as needed for migraine (may repeat 1 time in 2 hours).  10 tablet  3  . DISCONTD: topiramate (TOPAMAX) 25 MG tablet Take 4 tablets (100 mg total) by mouth 2 (two) times daily.  240 tablet  3    Allergies  Allergen Reactions  . Codeine Itching    ROS:  13 systems were reviewed and  are unremarkable.  Exam: . Filed Vitals:   01/13/12 1117  BP: 110/80  Pulse: 62  Weight: 214 lb (97.07 kg)    In general, well appearing women.  Impression/Recommendations:  1.  JME manifesting as myoclonic seizures - well controlled on Topamax 100 bid .  If she decided to get pregnant this will have to be weaned before attempts to get pregnant.  However, it could be restarted after delivering her baby.  While it is excreted in breast milk it is unlikely to be of high enough levels to significantly affect the child.  However, this will need to be discussed with her.  2.  Migraine headaches - seem to have remitted, perhaps due to Topamax use.  I am not worried they represent serious underlying pathology. 3.  Birth control - she remains currently on OCP and barrier method.   The patient would like to follow up in 6 months with Dr. Arbutus Leas.  She will follow up sooner if she has a problem.  Lupita Raider Modesto Charon, MD [facility], [place]

## 2012-03-21 ENCOUNTER — Emergency Department (HOSPITAL_COMMUNITY)
Admission: EM | Admit: 2012-03-21 | Discharge: 2012-03-21 | Disposition: A | Discharge status: Home / Self Care | Payer: 59 | Source: Home / Self Care | Attending: Emergency Medicine | Admitting: Emergency Medicine

## 2012-03-21 ENCOUNTER — Encounter (HOSPITAL_COMMUNITY): Payer: Self-pay | Admitting: Emergency Medicine

## 2012-03-21 ENCOUNTER — Emergency Department (INDEPENDENT_AMBULATORY_CARE_PROVIDER_SITE_OTHER): Payer: 59

## 2012-03-21 DIAGNOSIS — K529 Noninfective gastroenteritis and colitis, unspecified: Secondary | ICD-10-CM

## 2012-03-21 DIAGNOSIS — S29012A Strain of muscle and tendon of back wall of thorax, initial encounter: Secondary | ICD-10-CM

## 2012-03-21 DIAGNOSIS — K5289 Other specified noninfective gastroenteritis and colitis: Secondary | ICD-10-CM

## 2012-03-21 LAB — POCT URINALYSIS DIP (DEVICE)
Bilirubin Urine: NEGATIVE
Leukocytes, UA: NEGATIVE
Nitrite: NEGATIVE
Protein, ur: NEGATIVE mg/dL
Urobilinogen, UA: 0.2 mg/dL (ref 0.0–1.0)
pH: 6 (ref 5.0–8.0)

## 2012-03-21 LAB — CBC WITH DIFFERENTIAL/PLATELET
Basophils Relative: 0 % (ref 0–1)
Eosinophils Absolute: 0.1 10*3/uL (ref 0.0–0.7)
Hemoglobin: 15.2 g/dL — ABNORMAL HIGH (ref 12.0–15.0)
MCH: 31.1 pg (ref 26.0–34.0)
MCHC: 35.1 g/dL (ref 30.0–36.0)
Monocytes Absolute: 0.5 10*3/uL (ref 0.1–1.0)
Monocytes Relative: 7 % (ref 3–12)
Neutrophils Relative %: 53 % (ref 43–77)

## 2012-03-21 LAB — WET PREP, GENITAL: Clue Cells Wet Prep HPF POC: NONE SEEN

## 2012-03-21 MED ORDER — METHOCARBAMOL 500 MG PO TABS: 500.0000 mg | ORAL_TABLET | Freq: Three times a day (TID) | ORAL | Status: DC

## 2012-03-21 MED ORDER — MELOXICAM 15 MG PO TABS: 15.0000 mg | ORAL_TABLET | Freq: Every day | ORAL | Status: DC

## 2012-03-21 MED ORDER — CIPROFLOXACIN HCL 500 MG PO TABS: 500.0000 mg | ORAL_TABLET | Freq: Two times a day (BID) | ORAL | Status: DC

## 2012-03-21 NOTE — ED Notes (Signed)
Reports abdominal pain for one week, back pain for 3 weeks. Reports having vaginal discharge, pinkish discharge.  Reports abdominal pain worsening.

## 2012-03-21 NOTE — ED Provider Notes (Signed)
Chief Complaint  Patient presents with  . Abdominal Pain    History of Present Illness:    The patient is a 26 year old female who presents today with several issues: Abdominal pain, upper back pain, diarrhea, and vaginal bleeding.  The abdominal pain has been present for one week. It comes and goes and lasts for a few minutes at a time. She describes a burning pain around the navel that radiates towards the vagina, but has moved around quite a bit in her stomach and has been localized to both flank areas and all areas the abdomen in general. There no specific precipitating factors such as food or intercourse. It is nothing that makes it better. She tried Advil hot baths but these didn't help. She denies any fever, chills, anorexia, weight loss, or weight gain. She's had no  vomiting. Her appetite has been good. She's had no urinary symptoms such as dysuria, frequency, urgency, or hematuria.  She has had diarrhea for about 2 months off and on. This occurs about 3 or 4 times a week and she'll have about 3 or 4 stools a day when it happens. The patient denies any blood in the stool. She's lost about 30 pounds but has been trying to lose weight for an upcoming wedding. She's felt somewhat nauseated, and her urine has been cloudy.  She also has had a three-week history of pain in the upper back just below the right shoulder blade. This radiates down her right arm and into her lower back. It's worse with twisting, bending, and movement. It's not worse with deep inspiration and she denies any coughing, wheezing, or shortness of breath.  She's also had some spotting vaginal bleeding for the past 4 days. She notes some discharge and slight odor. She has a history of an ovarian cyst. Last menstrual period was about 2 weeks ago. She uses birth control pills and condoms.  She is on Topamax but she's been taking for about the past 3 months for seizures, migraines, and weight loss. Her neurologist is Dr. Modesto Charon. She  also has gastroesophageal reflux and takes omeprazole for that. She is allergic to codeine. Dr. Huel Cote is her gynecologist.  Review of Systems:  Other than noted above, the patient denies any of the following symptoms: Constitutional:  No fever, chills, fatigue, weight loss or anorexia. Lungs:  No cough or shortness of breath. Heart:  No chest pain, palpitations, syncope or edema.  No cardiac history. Abdomen:  No nausea, vomiting, hematememesis, melena, diarrhea, or hematochezia. GU:  No dysuria, frequency, urgency, or hematuria. Gyn:  No vaginal discharge, itching, abnormal bleeding, dyspareunia, or pelvic pain.  PMFSH:  Past medical history, family history, social history, meds, and allergies were reviewed along with nurse's notes.  No prior abdominal surgeries, past history of GI problems, STDs or GYN problems.  No history of aspirin or NSAID use.  No excessive alcohol intake.  Physical Exam:   Vital signs:  BP 146/90  Pulse 70  Temp 98.8 F (37.1 C) (Oral)  Resp 16  SpO2 98%  LMP 03/04/2012 Gen:  Alert, oriented, in no distress. Lungs:  Breath sounds clear and equal bilaterally.  No wheezes, rales or rhonchi. Heart:  Regular rhythm.  No gallops or murmers.   Abdomen:  Soft, flat, nondistended. There is no tenderness to palpation anywhere in the abdomen right now. No guarding or rebound. No organomegaly or mass. Bowel sounds are normally active. Pelvic:  Normal external genitalia there is a small amount of  white discharge and some mucoid discharge coming from the cervical os. No pain on cervical motion. Uterus is mid position, normal in size and shape and nontender. No adnexal masses or tenderness. Skin:  Clear, warm and dry.  No rash.  Labs:   Results for orders placed during the hospital encounter of 03/21/12  WET PREP, GENITAL      Component Value Range   Yeast Wet Prep HPF POC NONE SEEN  NONE SEEN   Trich, Wet Prep NONE SEEN  NONE SEEN   Clue Cells Wet Prep HPF POC  NONE SEEN  NONE SEEN   WBC, Wet Prep HPF POC MANY (*) NONE SEEN  CBC WITH DIFFERENTIAL      Component Value Range   WBC 7.5  4.0 - 10.5 K/uL   RBC 4.88  3.87 - 5.11 MIL/uL   Hemoglobin 15.2 (*) 12.0 - 15.0 g/dL   HCT 57.8  46.9 - 62.9 %   MCV 88.7  78.0 - 100.0 fL   MCH 31.1  26.0 - 34.0 pg   MCHC 35.1  30.0 - 36.0 g/dL   RDW 52.8  41.3 - 24.4 %   Platelets 300  150 - 400 K/uL   Neutrophils Relative 53  43 - 77 %   Neutro Abs 4.0  1.7 - 7.7 K/uL   Lymphocytes Relative 39  12 - 46 %   Lymphs Abs 2.9  0.7 - 4.0 K/uL   Monocytes Relative 7  3 - 12 %   Monocytes Absolute 0.5  0.1 - 1.0 K/uL   Eosinophils Relative 1  0 - 5 %   Eosinophils Absolute 0.1  0.0 - 0.7 K/uL   Basophils Relative 0  0 - 1 %   Basophils Absolute 0.0  0.0 - 0.1 K/uL  POCT URINALYSIS DIP (DEVICE)      Component Value Range   Glucose, UA NEGATIVE  NEGATIVE mg/dL   Bilirubin Urine NEGATIVE  NEGATIVE   Ketones, ur NEGATIVE  NEGATIVE mg/dL   Specific Gravity, Urine 1.025  1.005 - 1.030   Hgb urine dipstick NEGATIVE  NEGATIVE   pH 6.0  5.0 - 8.0   Protein, ur NEGATIVE  NEGATIVE mg/dL   Urobilinogen, UA 0.2  0.0 - 1.0 mg/dL   Nitrite NEGATIVE  NEGATIVE   Leukocytes, UA NEGATIVE  NEGATIVE  POCT PREGNANCY, URINE      Component Value Range   Preg Test, Ur NEGATIVE  NEGATIVE     Radiology:  Dg Abd Acute W/chest  03/21/2012  *RADIOLOGY REPORT*  Clinical Data: Abdominal pain  ACUTE ABDOMEN SERIES (ABDOMEN 2 VIEW & CHEST 1 VIEW)  Comparison: Abdominal radiograph dated 03/16/2011  Findings: Lungs are clear. No pleural effusion or pneumothorax.  Cardiomediastinal silhouette is within normal limits.  Nonobstructive bowel gas pattern.  Moderate stool throughout the colon.  No evidence of free air under the diaphragm on the upright view.  Visualized osseous structures are within normal limits.  IMPRESSION: No evidence of acute cardiopulmonary disease.  No evidence of small bowel obstruction or free air.  Moderate stool  throughout the colon.   Original Report Authenticated By: Charline Bills, M.D.    Other Labs Obtained at Urgent Care Center:  A stool culture was ordered.  Results are pending at this time and we will call about any positive results.  Assessment:  The primary encounter diagnosis was Gastroenteritis. A diagnosis of Upper back strain was also pertinent to this visit.  Her GI symptoms could be a side  effect of the Topamax, a manifestation of bacterial gastroenteritis, or inflammatory bowel disease. I suggested she discuss with her neurologist the possibility of getting off of the Topamax. If this doesn't help and if a ten-day course of Cipro doesn't help, I suggested she see a gastroenterologist. I think her back pain is unrelated and is probably due to a muscle strain. Finally her vaginal bleeding may be due to hormonal imbalance, possibly secondary to birth control pills.  Plan:   1.  The following meds were prescribed:   New Prescriptions   CIPROFLOXACIN (CIPRO) 500 MG TABLET    Take 1 tablet (500 mg total) by mouth every 12 (twelve) hours.   MELOXICAM (MOBIC) 15 MG TABLET    Take 1 tablet (15 mg total) by mouth daily.   METHOCARBAMOL (ROBAXIN) 500 MG TABLET    Take 1 tablet (500 mg total) by mouth 3 (three) times daily.   2.  The patient was instructed in symptomatic care and handouts were given. 3.  The patient was told to return if becoming worse in any way, if no better in 3 or 4 days, and given some red flag symptoms that would indicate earlier return.  Follow up:  The patient was told to follow up with Dr. Yancey Flemings for GI symptoms if no better in 2 weeks, with Dr. Magnus Ivan if her back is no better in 2 weeks, and I suggested she followup with her gynecologist if the bleeding persists.     Reuben Likes, MD 03/21/12 [phone]

## 2012-03-22 LAB — GC/CHLAMYDIA PROBE AMP
CT Probe RNA: NEGATIVE
GC Probe RNA: NEGATIVE

## 2012-05-24 ENCOUNTER — Ambulatory Visit (INDEPENDENT_AMBULATORY_CARE_PROVIDER_SITE_OTHER): Payer: 59 | Admitting: Neurology

## 2012-05-24 ENCOUNTER — Encounter: Payer: Self-pay | Admitting: Neurology

## 2012-05-24 VITALS — BP 118/70 | HR 70 | Temp 98.1°F | Resp 12 | Ht 65.0 in | Wt 210.0 lb

## 2012-05-24 DIAGNOSIS — G40309 Generalized idiopathic epilepsy and epileptic syndromes, not intractable, without status epilepticus: Secondary | ICD-10-CM

## 2012-05-24 DIAGNOSIS — G43009 Migraine without aura, not intractable, without status migrainosus: Secondary | ICD-10-CM

## 2012-05-24 DIAGNOSIS — G40B09 Juvenile myoclonic epilepsy, not intractable, without status epilepticus: Secondary | ICD-10-CM

## 2012-05-24 NOTE — Progress Notes (Signed)
Dear Dr. 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  She previously saw Dr. Modesto Charon, but since he is no longer with the practice, I reviewed his notes and I will be resuming the patient's care.    As you may recall, she is a 27 y.o. year old female with a history of JME and headaches who I first saw in January of 2013. She developed seizures at the age of 27. She has been treated with Depakote(alopecia and weight gain), Lamictal(150/200 - ?ineffective), Keppra(agitation).  She had been off meds for several years (4) because of cost and lack of insurance.  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  She was placed on Topamax, the patient states primarily because of headache.  She states that the Topamax has completely eliminated any myoclonic jerks and it has definitely helped headache.  She had some weight loss with the Topamax and enjoyed to that.  She states that even if she is tired now (which always precipitate her events in the past), she does not have the myoclonic seizures.  She attributes all of that to the Topamax.  She estimates that she has not had a myoclonic jerk in about a year.   She has a history of what sounds like migraine headaches, photophobia with throbbing pain, but much less intense. They usually are relieved by ibuprofen and Imitrex.  She has had a headache the last 3 days, but prior to that headaches were well controlled with Topamax.  She presents today because she is planning on getting pregnant.  She is getting married on 08/10/2012 and states that she may even try to get pregnant before the wedding.  She is, however, still on oral contraceptive medication.  She requests discontinuation of the Topamax.  Recent routine EEG did reveal poorly form generalized spike and wave  discharges.   PREVIOUS MEDICATIONS: She has been treated with Depakote(alopecia and weight gain), Lamictal(150/200 - ?ineffective), Keppra(agitation).   ALLERGIES:   Allergies  Allergen Reactions  . Codeine Itching    CURRENT MEDICATIONS:  Current Outpatient Prescriptions on File Prior to Visit  Medication Sig Dispense Refill  . ibuprofen (ADVIL,MOTRIN) 200 MG tablet Take 800 mg by mouth daily as needed. For pain      . omeprazole (PRILOSEC) 20 MG capsule Take 20 mg by mouth daily as needed. For heartburn       . Prenat w/o A-FE-DSS-Methfol-FA (PRENATAL MULTIVITAMIN) 90-600-400 MG-MCG-MCG tablet Take 1 tablet by mouth at bedtime.       . SUMAtriptan (IMITREX) 100 MG tablet Take 1 tablet (100 mg total) by mouth once as needed for migraine (may repeat 1 time in 2 hours).  10 tablet  3  . topiramate (TOPAMAX) 100 MG tablet Take 1 tablet (100 mg total) by mouth 2 (two) times daily.  60 tablet  11  . Biotin 1000 MCG tablet Take 1,000 mcg by mouth at bedtime.       . Norethindrone-Ethinyl Estradiol-Fe (GENERESS FE) 0.8-25 MG-MCG tablet Chew 1 tablet by mouth at bedtime.        PAST MEDICAL HISTORY:   Past Medical History  Diagnosis Date  . Hypertension   . Epilepsy     Last seizure 3 weeks ago  . Polycystic disease, ovaries   . Migraines     PAST  SURGICAL HISTORY:   Past Surgical History  Procedure Date  . Tonsillectomy     SOCIAL HISTORY:   History   Social History  . Marital Status: Single    Spouse Name: N/A    Number of Children: N/A  . Years of Education: N/A   Occupational History  . Not on file.   Social History Main Topics  . Smoking status: Never Smoker   . Smokeless tobacco: Never Used  . Alcohol Use: No  . Drug Use: No  . Sexually Active: Yes    Birth Control/ Protection: Pill   Other Topics Concern  . Not on file   Social History Narrative  . No narrative on file    FAMILY HISTORY:  noncontributory  ROS:  A complete 10 system review of systems  was obtained and was unremarkable apart from what is mentioned above.  PHYSICAL EXAMINATION:    VITALS:   Filed Vitals:   05/24/12 1343  BP: 118/70  Pulse: 70  Temp: 98.1 F (36.7 C)  Resp: 12  Height: 5\' 5"  (1.651 m)  Weight: 210 lb (95.255 kg)   Wt Readings from Last 3 Encounters:  05/24/12 210 lb (95.255 kg)  01/13/12 214 lb (97.07 kg)  11/29/11 233 lb (105.688 kg)    GEN:  Normal appears female in no acute distress.  Appears stated age. HEENT:  Normocephalic, atraumatic. The mucous membranes are moist. The superficial temporal arteries are without ropiness or tenderness. Cardiovascular: Regular rate and rhythm. Lungs: Clear to auscultation bilaterally. Neck/Heme: There are no carotid bruits noted bilaterally.  NEUROLOGICAL: Orientation:  The patient is alert and oriented x 3.  Fund of knowledge is appropriate.  Recent and remote memory intact.  Attention span and concentration normal.  Repeats and names without difficulty. Cranial nerves: There is good facial symmetry. The pupils are equal round and reactive to light bilaterally. Fundoscopic exam reveals clear disc margins bilaterally. Extraocular muscles are intact and visual fields are full to confrontational testing. Speech is fluent and clear. Soft palate rises symmetrically and there is no tongue deviation. Hearing is intact to conversational tone. Tone: Tone is good throughout. Sensation: Sensation is intact to light touch throughout. Coordination:  The patient has no difficulty with RAM's or FNF bilaterally. Motor: Strength is 5/5 in the bilateral upper and lower extremities.  Shoulder shrug is equal and symmetric. There is no pronator drift.  There are no fasciculations noted. DTR's: Deep tendon reflexes are 2/4 at the bilateral biceps, triceps, brachioradialis, patella and achilles.  Plantar responses are downgoing bilaterally. Gait and Station: The patient is able to ambulate without difficulty.      Impression/Recommendations:  1.  JME manifesting as myoclonic seizures - well controlled on Topamax 100 bid   -I had a long discussion with the patient today.  Greater than 50% of the 45 minute visit was spent in counseling.  We talked about risks of going off of the medication, and also talked about the risks of medication in pregnancy.  Specifically, we talked about the risk of cleft lip, which is a fairly low risk but one she should know about.  We talked about the fact that JME is a primary generalized epilepsy and talked about the implications of this.  We talked about the fact that many think that JME pts should be on medication for life.  She has all of the information, and decided that she would like to go off of the medication for pregnancy.  I will titrate  it off, but I asked her to stay on birth control at least for one month after the medication is discontinued.  If she begins to have myoclonic jerks, then we may need to reconsider.  Most importantly be for her to get proper sleep, as she has never had any event when not sleep deprived or extremely fatigued. 2.  Migraine headaches - seem to have remitted, perhaps due to Topamax use.  I am not worried they represent serious underlying pathology.  -I did tell her that she should not use the Imitrex while pregnant.  She can use it before she gets pregnant.  If she gets a headache while pregnant,  She can use Tylenol.  I also talked to her about the fact that 25 mg to 50 mg of Benadryl at nighttime can be helpful for prevention of headache during pregnancy.  Benadryl is pregnancy category B.   3.  she will followup with me after her pregnancy, or sooner if new neurologic issues arise.

## 2012-05-24 NOTE — Patient Instructions (Signed)
1.  Decrease topamax to 100 mg once per day for a week, then cut pill in half and take 50 mg for a week and then stop the medication 2.  Call me if you have ANY seizures 3.  Get plenty of rest 4.  You may take benadryl 25 mg to 50 mg at night for headache. 5.  Follow up after pregnancy

## 2012-09-14 ENCOUNTER — Other Ambulatory Visit: Payer: Self-pay | Admitting: Nurse Practitioner

## 2012-09-14 DIAGNOSIS — R109 Unspecified abdominal pain: Secondary | ICD-10-CM

## 2012-09-20 ENCOUNTER — Ambulatory Visit
Admission: RE | Admit: 2012-09-20 | Discharge: 2012-09-20 | Disposition: A | Discharge status: Home / Self Care | Payer: 59 | Source: Ambulatory Visit | Attending: Nurse Practitioner | Admitting: Nurse Practitioner

## 2012-09-20 DIAGNOSIS — R109 Unspecified abdominal pain: Secondary | ICD-10-CM

## 2012-12-26 ENCOUNTER — Encounter: Payer: Self-pay | Admitting: Nurse Practitioner

## 2013-03-18 ENCOUNTER — Encounter: Payer: Self-pay | Admitting: Neurology

## 2013-03-18 ENCOUNTER — Ambulatory Visit (INDEPENDENT_AMBULATORY_CARE_PROVIDER_SITE_OTHER): Payer: 59 | Admitting: Neurology

## 2013-03-18 VITALS — BP 138/80 | HR 60 | Temp 98.1°F | Resp 12 | Ht 65.0 in | Wt 192.6 lb

## 2013-03-18 DIAGNOSIS — G40309 Generalized idiopathic epilepsy and epileptic syndromes, not intractable, without status epilepticus: Secondary | ICD-10-CM

## 2013-03-18 DIAGNOSIS — G40B09 Juvenile myoclonic epilepsy, not intractable, without status epilepticus: Secondary | ICD-10-CM

## 2013-03-18 DIAGNOSIS — L659 Nonscarring hair loss, unspecified: Secondary | ICD-10-CM

## 2013-03-18 MED ORDER — TOPIRAMATE 100 MG PO TABS: 100.0000 mg | ORAL_TABLET | Freq: Two times a day (BID) | ORAL | Status: DC

## 2013-03-18 MED ORDER — TOPIRAMATE 50 MG PO TABS: 50.0000 mg | ORAL_TABLET | Freq: Two times a day (BID) | ORAL | Status: DC

## 2013-03-18 NOTE — Progress Notes (Signed)
Dear Dr. 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  She previously saw Dr. Modesto Charon, but since he is no longer with the practice, I reviewed his notes and I will be resuming the patient's care.    As you may recall, she is a 27 y.o. year old female with a history of JME and headaches who I first saw in January of 2013. She developed seizures at the age of 32. She has been treated with Depakote(alopecia and weight gain), Lamictal(150/200 - ?ineffective), Keppra(agitation).  She had been off meds for several years (4) because of cost and lack of insurance.  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  She was placed on Topamax, the patient states primarily because of headache.  She states that the Topamax has completely eliminated any myoclonic jerks and it has definitely helped headache.  She had some weight loss with the Topamax and enjoyed to that.  She states that even if she is tired now (which always precipitate her events in the past), she does not have the myoclonic seizures.  She attributes all of that to the Topamax.  She estimates that she has not had a myoclonic jerk in about a year.   She has a history of what sounds like migraine headaches, photophobia with throbbing pain, but much less intense. They usually are relieved by ibuprofen and Imitrex.  She has had a headache the last 3 days, but prior to that headaches were well controlled with Topamax.  She presents today because she is planning on getting pregnant.  She is getting married on 08/10/2012 and states that she may even try to get pregnant before the wedding.  She is, however, still on oral contraceptive medication.  She requests discontinuation of the Topamax.  Routine EEG did reveal poorly form generalized spike and wave discharges.  03/18/13  update:  The pt presents today for f/u.  She has JME.  Despite my recommendation otherwise, the pt wanted off of Topamax for pregnancy.  She has been off of it for 10 months.  She wants to go back on the medication as she has not gotten pregnant.  She has started on spironolactone for alopecia and Glucophage for polycystic ovarian disease, and was told that she could not get pregnant while on these medications. She has lost weight.  She is now on adipex for weight loss.  The only seizure that she had was last Thursday.  She states that she quickly got out of the bed because she was cold and wanted to take a shower and had several recurrent episodes of arm flinging in the shower, but then she went back to bed and did fine after she woke back up.  Otherwise, she thinks that she has been seizure-free for virtually all 10 months.  She has only been on the Adipex for about 8 weeks.  She has gotten married since our last visit, and her husband accompanied her today and supplement the history.   PREVIOUS MEDICATIONS: She has been treated with Depakote(alopecia and weight gain), Lamictal(150/200 - ?ineffective), Keppra(agitation).   ALLERGIES:   Allergies  Allergen Reactions  . Codeine Itching    CURRENT MEDICATIONS:  Current Outpatient Prescriptions on File Prior to Visit  Medication Sig Dispense Refill  . Biotin 1000 MCG tablet Take 1,000 mcg by  mouth at bedtime.       Marland Kitchen ibuprofen (ADVIL,MOTRIN) 200 MG tablet Take 800 mg by mouth daily as needed. For pain      . omeprazole (PRILOSEC) 20 MG capsule Take 20 mg by mouth daily as needed. For heartburn       . Prenat w/o A-FE-DSS-Methfol-FA (PRENATAL MULTIVITAMIN) 90-600-400 MG-MCG-MCG tablet Take 1 tablet by mouth at bedtime.       . SUMAtriptan (IMITREX) 100 MG tablet Take 1 tablet (100 mg total) by mouth once as needed for migraine (may repeat 1 time in 2 hours).  10 tablet  3  . topiramate (TOPAMAX) 100 MG tablet Take 1 tablet (100 mg total) by mouth 2  (two) times daily.  60 tablet  11   No current facility-administered medications on file prior to visit.    PAST MEDICAL HISTORY:   Past Medical History  Diagnosis Date  . Hypertension   . Epilepsy     Last seizure 3 weeks ago  . Polycystic disease, ovaries   . Migraines     PAST SURGICAL HISTORY:   Past Surgical History  Procedure Laterality Date  . Tonsillectomy      SOCIAL HISTORY:   History   Social History  . Marital Status: Married    Spouse Name: N/A    Number of Children: N/A  . Years of Education: N/A   Occupational History  . Not on file.   Social History Main Topics  . Smoking status: Never Smoker   . Smokeless tobacco: Never Used  . Alcohol Use: No  . Drug Use: No  . Sexual Activity: Yes    Birth Control/ Protection: Pill   Other Topics Concern  . Not on file   Social History Narrative  . No narrative on file    FAMILY HISTORY:  noncontributory  ROS:  A complete 10 system review of systems was obtained and was unremarkable apart from what is mentioned above.  PHYSICAL EXAMINATION:    VITALS:   Filed Vitals:   03/18/13 1448  BP: 138/80  Pulse: 60  Temp: 98.1 F (36.7 C)  Resp: 12  Height: 5\' 5"  (1.651 m)  Weight: 192 lb 9.6 oz (87.363 kg)   Wt Readings from Last 3 Encounters:  03/18/13 192 lb 9.6 oz (87.363 kg)  05/24/12 210 lb (95.255 kg)  01/13/12 214 lb (97.07 kg)    GEN:  Normal appears female in no acute distress.  Appears stated age. HEENT:  Normocephalic, atraumatic. The mucous membranes are moist. The superficial temporal arteries are without ropiness or tenderness. Cardiovascular: Regular rate and rhythm. Lungs: Clear to auscultation bilaterally. Neck/Heme: There are no carotid bruits noted bilaterally.  NEUROLOGICAL: Orientation:  The patient is alert and oriented x 3.  Fund of knowledge is appropriate.  Recent and remote memory intact.  Attention span and concentration normal.  Repeats and names without  difficulty. Cranial nerves: There is good facial symmetry. The pupils are equal round and reactive to light bilaterally. Fundoscopic exam reveals clear disc margins bilaterally. Extraocular muscles are intact and visual fields are full to confrontational testing. Speech is fluent and clear. Soft palate rises symmetrically and there is no tongue deviation. Hearing is intact to conversational tone. Tone: Tone is good throughout. Sensation: Sensation is intact to light touch throughout. Coordination:  The patient has no difficulty with RAM's or FNF bilaterally. Motor: Strength is 5/5 in the bilateral upper and lower extremities.  Shoulder shrug is equal and symmetric. There is no  pronator drift.  There are no fasciculations noted. DTR's: Deep tendon reflexes are 2/4 at the bilateral biceps, triceps, brachioradialis, patella and achilles.  Plantar responses are downgoing bilaterally. Gait and Station: The patient is able to ambulate without difficulty.   LABS:    Chemistry   No results found for this basename: NA, K, CL, CO2, BUN, CREATININE, GLU   No results found for this basename: CALCIUM, ALKPHOS, AST, ALT, BILITOT     No results found for this basename: TSH   Lab Results  Component Value Date   WBC 7.5 03/21/2012   HGB 15.2* 03/21/2012   HCT 43.3 03/21/2012   MCV 88.7 03/21/2012   PLT 300 03/21/2012       Impression/Recommendations:  1.  JME manifesting as myoclonic seizures   -We are going to put the patient back on Topamax, and we will work up to 100 mg twice a day.  I told her that I do not recommend d/c the medication, even when future pregnancy planning.  -I recommend that she d/c the adipex she is using for weight loss.  This can lower seizure threshold.  -I will try to get a copy of labs from her gyn.  -seizure and safety was discussed 2.  Migraine headaches -   -she is going back on topamax and previously did well with this 3.  Alopecia  -She asks me to refer her to  endocrinology.  I sent a referral to Dr. Evlyn Kanner 3.  she will followup with me in one year, sooner should new neurologic issues arise.

## 2013-03-18 NOTE — Patient Instructions (Signed)
1.  Start topamax - 50 mg - 1 tablet in the AM for 2 weeks, then 1 tablet twice per day for 2 weeks, then 100 mg in the AM and 50 mg in the PM for 2 weeks and then 100 mg twice per day

## 2013-03-29 ENCOUNTER — Encounter: Payer: Self-pay | Admitting: Internal Medicine

## 2013-03-29 ENCOUNTER — Ambulatory Visit (INDEPENDENT_AMBULATORY_CARE_PROVIDER_SITE_OTHER): Payer: 59 | Admitting: Internal Medicine

## 2013-03-29 VITALS — BP 112/80 | HR 84 | Temp 98.2°F | Resp 10 | Ht 65.0 in | Wt 190.0 lb

## 2013-03-29 DIAGNOSIS — L650 Telogen effluvium: Secondary | ICD-10-CM | POA: Insufficient documentation

## 2013-03-29 DIAGNOSIS — L659 Nonscarring hair loss, unspecified: Secondary | ICD-10-CM

## 2013-03-29 LAB — IBC PANEL: Transferrin: 275.8 mg/dL (ref 212.0–360.0)

## 2013-03-29 LAB — T3, FREE: T3, Free: 2.8 pg/mL (ref 2.3–4.2)

## 2013-03-29 LAB — CBC
Hemoglobin: 14.4 g/dL (ref 12.0–15.0)
MCHC: 34.3 g/dL (ref 30.0–36.0)
MCV: 90.1 fl (ref 78.0–100.0)
RDW: 12.6 % (ref 11.5–14.6)

## 2013-03-29 NOTE — Progress Notes (Signed)
Subjective:     Patient ID: Brooke [name], female   DOB: Apr 24, 1986, 27 y.o.   MRN: [id number]  HPI Ms Brooke [name] is a pleasant 27 y.o. woman referred by Dr Tat for evaluation for alopecia.  She was dx with PCOS at 19-13 y/o.  Hair loss: - She started to have hair loss since 27 y/o.  - She was recommended hair restoration >> expensive. - She started Spironolactone and Rogain. She could not tolerate Spironolactone (nausea). She got fair effects with Rogaine.  - on Topamax 50 >> increasing to 100 mg - can cause some hair loss - feeling great on this; but previously on Depakote which caused major hair loss and weight gain - Now has hair pieces and extensions - does a great job masking the alopecia  Hirsutism: - She is getting Laser tx >> on face and breasts. She also has hair on abdomen and thighs.   Irregular menstrual cycles: - only regulated by OCP >> now on them - had infertility w/u - U/S ovaries 02/05/2005 : ovaries normal - trying to get pregnant for ~1 year, got married in 08/2012, but now back on OCP  Weight gain: - started Phentermine >> had 1 seizure (h/o seizures, seen by Dr Tat) - on Metformin 500 mg qhs  Takes B1 and B12 vitamins, prenatal MVI. No h/o anemia, unclear if had ferritin tested.  No h/o hypothyroidism. No TSH available for review. No FH of thyroid disorders.  Review of Systems Constitutional: no weight gain/loss, + fatigue, + subjective hyperthermia, + hot flushes, + poor sleep Eyes: + blurry vision, no xerophthalmia ENT: no sore throat, no nodules palpated in throat, no dysphagia/odynophagia, no hoarseness Cardiovascular: no CP/SOB/palpitations/leg swelling Respiratory: no cough/SOB Gastrointestinal: + N/V/D/+ C Musculoskeletal: + muscle aches/no joint aches Skin: no rashes, + itching, hair loss, excess hair Neurological: no tremors/numbness/tingling/dizziness, + HA, + seizures-  Only one lately, after starting Phentermine (stopped since) Psychiatric:  no depression/+ anxiety Low libido  Past Medical History  Diagnosis Date  . Hypertension   . Epilepsy     Last seizure 3 weeks ago  . Polycystic disease, ovaries   . Migraines    Past Surgical History  Procedure Laterality Date  . Tonsillectomy     History   Social History  . Marital Status: Married    Spouse Name: N/A    Number of Children: 0   Occupational History  . Teller at [employer]   Social History Main Topics  . Smoking status: Never Smoker   . Smokeless tobacco: Never Used  . Alcohol Use: No  . Drug Use: No  . Sexual Activity: Yes    Partners: Male    Birth Control/ Protection: Pill   Social History Narrative   Married: no kids   Regular exercise: not at this time   Caffeine use: no   Current Outpatient Prescriptions on File Prior to Visit  Medication Sig Dispense Refill  . Biotin 1000 MCG tablet Take 1,000 mcg by mouth at bedtime.       . clonazePAM (KLONOPIN) 0.5 MG tablet Take 0.5 mg by mouth 2 (two) times daily as needed for anxiety.      Marland Kitchen ibuprofen (ADVIL,MOTRIN) 200 MG tablet Take 800 mg by mouth daily as needed. For pain      . metFORMIN (GLUCOPHAGE) 500 MG tablet Take by mouth daily.      Marland Kitchen omeprazole (PRILOSEC) 20 MG capsule Take 20 mg by mouth daily as needed. For heartburn       .  Prenat w/o A-FE-DSS-Methfol-FA (PRENATAL MULTIVITAMIN) 90-600-400 MG-MCG-MCG tablet Take 1 tablet by mouth at bedtime.       . topiramate (TOPAMAX) 50 MG tablet Take 1 tablet (50 mg total) by mouth 2 (two) times daily.  60 tablet  2  . phentermine (ADIPEX-P) 37.5 MG tablet Take 37.5 mg by mouth daily before breakfast.      . spironolactone (ALDACTONE) 50 MG tablet Take 50 mg by mouth 2 (two) times daily.      Marland Kitchen topiramate (TOPAMAX) 100 MG tablet Take 1 tablet (100 mg total) by mouth 2 (two) times daily.  60 tablet  11   No current facility-administered medications on file prior to visit.   Allergies  Allergen Reactions  . Codeine Itching   Family History   Problem Relation Age of Onset  . Diabetes Mother   . Heart failure Mother   . Hypertension Mother   . Hypertension Father    Objective:   Physical Exam BP 112/80  Pulse 84  Temp(Src) 98.2 F (36.8 C) (Oral)  Resp 10  Ht 5\' 5"  (1.651 m)  Wt 190 lb (86.183 kg)  BMI 31.62 kg/m2  SpO2 97% Constitutional: overweight, in NAD Eyes: PERRLA, EOMI, no exophthalmos ENT: moist mucous membranes, no thyromegaly, no cervical lymphadenopathy Cardiovascular: RRR, No MRG Respiratory: CTA B Gastrointestinal: abdomen soft, NT, ND, BS+ Musculoskeletal: no deformities, strength intact in all 4 Skin: moist, warm, no rashes; pronounced alopecia on crown (covers it with hair pieces/extensions) Neurological: no tremor with outstretched hands, DTR normal in all 4  Assessment:     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 She is now off Depakote for a long time, but the hair is not growing back. She has short hairs on top of her head, but overall the hair is very sparse. She is covering it very well with hair pieces/extensions. She is rightfully very bothered by this. We discussed about possible causes of alopecia:  Hypothyroidism - I do not have recent thyroid tests to review, will check TFTs today and also TPO antibodies  Pregnancy - she is on oral contraceptives  Menopause - no  poor diet - we discussed about the benefits of a plant-based diet and I gave her several references regarding the diet  Stress - advised to try to relax more  Vitamin deficiencies - she is taking B1and B12 vitamin supplements, and her MVI contain B6 vitamin; is not taking excess vitamin A   Micronutrient deficiencies - she's not anemic, will check a ferritin to see if this is low, in this case she would benefit from iron supplementation; will also check zinc since a deficiency was also associated with hair loss  Advised  her to get all essential amino acids from the diet (especially L-lysine, since a deficiency of this amino acid has been living with hair loss)  Medications: Topamax can cause some hair loss but she is greatly benefiting from it - Advised to try to use scalp concealer. Continue Propecia under dermatology care. - will schedule another appt if labs abnormal  Orders Placed This Encounter  Procedures  . CBC  . Zinc  . IBC panel  . TSH  . T4, free  . T3, free  . Thyroid Peroxidase Antibody   April 02, 2013   KARL KNARR [address] [place] [address]  Dear Ms. Cordova,  Below are the results from your recent visit:  CBC      Result Value Range   WBC 7.3  4.5 - 10.5 K/uL   RBC 4.65  3.87 - 5.11 Mil/uL   Platelets 298.0  150.0 - 400.0 K/uL   Hemoglobin 14.4  12.0 - 15.0 g/dL   HCT 45.4  09.8 - 11.9 %   MCV 90.1  78.0 - 100.0 fl   MCHC 34.3  30.0 - 36.0 g/dL   RDW 14.7  82.9 - 56.2 %  IBC PANEL      Result Value Range   Iron 91  42 - 145 ug/dL   Transferrin 130.8  657.8 - 360.0 mg/dL   Saturation Ratios 46.9  20.0 - 50.0 %  TSH      Result Value Range   TSH 0.40  0.35 - 5.50 uIU/mL  T4, FREE      Result Value Range   Free T4 0.90  0.60 - 1.60 ng/dL  T3, FREE      Result Value Range   T3, Free 2.8  2.3 - 4.2 pg/mL  THYROID PEROXIDASE ANTIBODY      Result Value Range   Thyroid Peroxidase Antibody <10.0  <35.0 IU/mL   Narrative:    Performed at:  [facility]                [address]                [place], [place] [address]  ZINC      Result Value Range   Zinc 120  60 - 130 mcg/dL   Narrative:    Performed at:  [facility]                [address]                [place], [place] [address]   The test results are all normal.  I am not sure what the next step would be in the hair loss evaluation, but please continue to work with dermatology for this (continue Propecia).  If you have any questions or concerns,  please don't hesitate to call me.  Sincerely,  Carlus Pavlov, MD

## 2013-03-29 NOTE — Patient Instructions (Addendum)
Please stop at the lab.  I will let you know the results as soon as they return. We will schedule a new appointment if these are abnormal. Try to use scalp concealer.  Please consider the following ways to cut down carbs and fat and increase fiber and micronutrients in your diet:  - substitute whole grain for white bread or pasta - substitute brown rice for white rice - substitute 90-calorie flat bread pieces for slices of bread when possible - substitute sweet potatoes or yams for white potatoes - substitute humus for margarine - substitute tofu for cheese when possible - substitute almond or rice milk for regular milk (would not drink soy milk daily due to concern for soy estrogen influence on breast cancer risk) - substitute dark chocolate for other sweets when possible - substitute water - can add lemon or orange slices for taste - for diet sodas (artificial sweeteners will trick your body that you can eat sweets without getting calories and will lead you to overeating and weight gain in the long run) - do not skip breakfast or other meals (this will slow down the metabolism and will result in more weight gain over time)  - can try smoothies made from fruit and almond/rice milk in am instead of regular breakfast - can also try old-fashioned (not instant) oatmeal made with almond/rice milk in am - order the dressing on the side when eating salad at a restaurant (pour less than half of the dressing on the salad) - eat as little meat as possible - can try juicing, but should not forget that juicing will get rid of the fiber, so would alternate with eating raw veg./fruits or drinking smoothies - use as little oil as possible, even when using olive oil - can dress a salad with a mix of balsamic vinegar and lemon juice, for e.g. - use agave nectar, stevia sugar, or regular sugar rather than artificial sweateners - steam or broil/roast veggies  - snack on veggies/fruit/nuts (unsalted,  preferably) when possible, rather than processed foods - reduce or eliminate aspartame in diet (it is in diet sodas, chewing gum, etc) Read the labels!  Try to read Dr. Katherina Right book: "Program for Reversing Diabetes" for the vegan concept and other ideas for healthy eating.  Plant-based diet materials:  - Lectures (you tube):  Lequita Asal: "Breaking the Food Seduction"  Doug Lisle: "How to Lose Weight, without Losing Your Mind"  Lucile Crater: "What is Insulin Resistance" [employer] - Documentaries:  Supersize Me  [employer]  Forks over [employer], Sick and Nearly Dead  [employer] the [employer] - Books:  Lequita Asal: "Program for Reversing Diabetes"  Ferol Luz: "The [place] Study"  Konrad Penta: "Supermarket Vegan" (cookbook) - Facebook pages:   Reece Agar versus Knives  Vegucated  [employer] Magazine  Food Matters - Healthy nutrition info websites:  LateTelevision.com.ee

## 2013-04-01 LAB — THYROID PEROXIDASE ANTIBODY: Thyroperoxidase Ab SerPl-aCnc: <10 IU/mL (ref <35.0)

## 2013-04-02 ENCOUNTER — Other Ambulatory Visit: Payer: Self-pay | Admitting: *Deleted

## 2013-04-02 ENCOUNTER — Telehealth: Payer: Self-pay | Admitting: Internal Medicine

## 2013-04-02 ENCOUNTER — Encounter: Payer: Self-pay | Admitting: Internal Medicine

## 2013-04-02 NOTE — Telephone Encounter (Signed)
Pt would like her lab results  Call back [phone]  Thank you :)

## 2013-04-02 NOTE — Telephone Encounter (Signed)
Called pt and lvm advising her that results just came back, but Dr Elvera Lennox has not went through them yet; when she does we will call you with results.

## 2013-04-17 ENCOUNTER — Emergency Department (HOSPITAL_COMMUNITY)
Admission: EM | Admit: 2013-04-17 | Discharge: 2013-04-17 | Disposition: A | Discharge status: Home / Self Care | Payer: 59 | Source: Home / Self Care

## 2013-04-17 ENCOUNTER — Emergency Department (INDEPENDENT_AMBULATORY_CARE_PROVIDER_SITE_OTHER): Payer: 59

## 2013-04-17 ENCOUNTER — Encounter (HOSPITAL_COMMUNITY): Payer: Self-pay | Admitting: Family Medicine

## 2013-04-17 DIAGNOSIS — R109 Unspecified abdominal pain: Secondary | ICD-10-CM

## 2013-04-17 DIAGNOSIS — E282 Polycystic ovarian syndrome: Secondary | ICD-10-CM

## 2013-04-17 LAB — POCT PREGNANCY, URINE: Preg Test, Ur: NEGATIVE

## 2013-04-17 LAB — CBC WITH DIFFERENTIAL/PLATELET
Basophils Absolute: 0 10*3/uL (ref 0.0–0.1)
Basophils Relative: 0 % (ref 0–1)
Eosinophils Relative: 2 % (ref 0–5)
HCT: 43.2 % (ref 36.0–46.0)
Hemoglobin: 15.2 g/dL — ABNORMAL HIGH (ref 12.0–15.0)
MCH: 32.1 pg (ref 26.0–34.0)
MCHC: 35.2 g/dL (ref 30.0–36.0)
MCV: 91.3 fL (ref 78.0–100.0)
Monocytes Absolute: 0.6 10*3/uL (ref 0.1–1.0)
Monocytes Relative: 8 % (ref 3–12)
RDW: 12.6 % (ref 11.5–15.5)

## 2013-04-17 LAB — COMPREHENSIVE METABOLIC PANEL
ALT: 24 U/L (ref 0–35)
AST: 19 U/L (ref 0–37)
Albumin: 4.2 g/dL (ref 3.5–5.2)
Alkaline Phosphatase: 56 U/L (ref 39–117)
BUN: 12 mg/dL (ref 6–23)
Chloride: 105 mEq/L (ref 96–112)
Potassium: 3.7 mEq/L (ref 3.5–5.1)
Sodium: 139 mEq/L (ref 135–145)
Total Bilirubin: 0.4 mg/dL (ref 0.3–1.2)
Total Protein: 7.5 g/dL (ref 6.0–8.3)

## 2013-04-17 LAB — POCT URINALYSIS DIP (DEVICE)
Ketones, ur: NEGATIVE mg/dL
Leukocytes, UA: NEGATIVE
Protein, ur: NEGATIVE mg/dL
Urobilinogen, UA: 0.2 mg/dL (ref 0.0–1.0)
pH: 6 (ref 5.0–8.0)

## 2013-04-17 NOTE — ED Provider Notes (Signed)
CSN: [id number]     Arrival date & time 04/17/13  1704 History   None    Chief Complaint  Patient presents with  . Abdominal Pain   (Consider location/radiation/quality/duration/timing/severity/associated sxs/prior Treatment) HPI  Abdominal pain: onset oat 5pm yesterday. Comes and goes. Sharp. Chills and fevers up to 98.6. No abdominal surgeries. Siblings w/ appendectomies w/ appendix in the wrong place??? Advil 600mg  w/o benefit. Comes on at random times. Not associated w/ food. LMP 04/11/13. On Femora for retgulation of period. Pregnancy test on Sunday negative. Denies n/v/d. BM daily. Lasts around 30 min. deneis dysuria/frequency  Past Medical History  Diagnosis Date  . Hypertension   . Epilepsy     Last seizure 3 weeks ago  . Polycystic disease, ovaries   . Migraines    Past Surgical History  Procedure Laterality Date  . Tonsillectomy     Family History  Problem Relation Age of Onset  . Diabetes Mother   . Heart failure Mother   . Hypertension Mother   . Hypertension Father    History  Substance Use Topics  . Smoking status: Never Smoker   . Smokeless tobacco: Never Used  . Alcohol Use: No   OB History   Grav Para Term Preterm Abortions TAB SAB Ect Mult Living   0              Review of Systems  Constitutional: Negative for activity change and appetite change.  Gastrointestinal: Positive for abdominal pain. Negative for nausea, vomiting, diarrhea, constipation, blood in stool, abdominal distention and anal bleeding.  All other systems reviewed and are negative.    Allergies  Codeine  Home Medications   Current Outpatient Rx  Name  Route  Sig  Dispense  Refill  . topiramate (TOPAMAX) 100 MG tablet   Oral   Take 1 tablet (100 mg total) by mouth 2 (two) times daily.   60 tablet   11     please don't fill until you request.   . Biotin 1000 MCG tablet   Oral   Take 1,000 mcg by mouth at bedtime.          . clonazePAM (KLONOPIN) 0.5 MG tablet  Oral   Take 0.5 mg by mouth 2 (two) times daily as needed for anxiety.         Marland Kitchen ibuprofen (ADVIL,MOTRIN) 200 MG tablet   Oral   Take 800 mg by mouth daily as needed. For pain         . metFORMIN (GLUCOPHAGE) 500 MG tablet   Oral   Take by mouth daily.         Marland Kitchen omeprazole (PRILOSEC) 20 MG capsule   Oral   Take 20 mg by mouth daily as needed. For heartburn          . phentermine (ADIPEX-P) 37.5 MG tablet   Oral   Take 37.5 mg by mouth daily before breakfast.         . Prenat w/o A-FE-DSS-Methfol-FA (PRENATAL MULTIVITAMIN) 90-600-400 MG-MCG-MCG tablet   Oral   Take 1 tablet by mouth at bedtime.          Marland Kitchen spironolactone (ALDACTONE) 50 MG tablet   Oral   Take 50 mg by mouth 2 (two) times daily.         Marland Kitchen topiramate (TOPAMAX) 50 MG tablet   Oral   Take 1 tablet (50 mg total) by mouth 2 (two) times daily.   60 tablet   2  BP 141/97  Pulse 92  Temp(Src) 99.1 F (37.3 C) (Oral)  Resp 18  SpO2 96%  LMP 04/11/2013 Physical Exam  Constitutional: She is oriented to person, place, and time. She appears well-developed and well-nourished. No distress.  HENT:  Head: Normocephalic and atraumatic.  Eyes: EOM are normal. Pupils are equal, round, and reactive to light.  Neck: Normal range of motion.  Cardiovascular: Normal rate, normal heart sounds and intact distal pulses.   No murmur heard. Pulmonary/Chest: Effort normal and breath sounds normal. No respiratory distress.  Abdominal: Soft. Bowel sounds are normal. She exhibits no distension and no mass. There is no tenderness. There is no rebound and no guarding.  Musculoskeletal: Normal range of motion.  Neurological: She is alert and oriented to person, place, and time.  Skin: Skin is warm and dry. She is not diaphoretic.  Psychiatric: She has a normal mood and affect. Her behavior is normal. Judgment and thought content normal.    ED Course  Procedures (including critical care time) Labs Review Labs  Reviewed  CBC WITH DIFFERENTIAL - Abnormal; Notable for the following:    Hemoglobin 15.2 (*)    All other components within normal limits  COMPREHENSIVE METABOLIC PANEL  LIPASE, BLOOD  POCT URINALYSIS DIP (DEVICE)   Imaging Review Dg Abd 1 View  04/17/2013   CLINICAL DATA:  Abdominal pain  EXAM: ABDOMEN - 1 VIEW  COMPARISON:  March 21, 2012  FINDINGS: There is moderate stool in the colon. The bowel gas pattern is unremarkable. No obstruction or free air is seen on this supine examination. There are no abnormal calcifications.  IMPRESSION: Bowel gas pattern unremarkable.   Electronically Signed   By: Bretta Bang M.D.   On: 04/17/2013 18:08    EKG Interpretation    Date/Time:    Ventricular Rate:    PR Interval:    QRS Duration:   QT Interval:    QTC Calculation:   R Axis:     Text Interpretation:             Results for orders placed during the hospital encounter of 04/17/13 (from the past 24 hour(s))  COMPREHENSIVE METABOLIC PANEL     Status: None   Collection Time    04/17/13  5:45 PM      Result Value Range   Sodium 139  135 - 145 mEq/L   Potassium 3.7  3.5 - 5.1 mEq/L   Chloride 105  96 - 112 mEq/L   CO2 21  19 - 32 mEq/L   Glucose, Bld 99  70 - 99 mg/dL   BUN 12  6 - 23 mg/dL   Creatinine, Ser 1.61  0.50 - 1.10 mg/dL   Calcium 9.3  8.4 - 09.6 mg/dL   Total Protein 7.5  6.0 - 8.3 g/dL   Albumin 4.2  3.5 - 5.2 g/dL   AST 19  0 - 37 U/L   ALT 24  0 - 35 U/L   Alkaline Phosphatase 56  39 - 117 U/L   Total Bilirubin 0.4  0.3 - 1.2 mg/dL   GFR calc non Af Amer >90  >90 mL/min   GFR calc Af Amer >90  >90 mL/min  LIPASE, BLOOD     Status: None   Collection Time    04/17/13  5:45 PM      Result Value Range   Lipase 49  11 - 59 U/L  CBC WITH DIFFERENTIAL     Status: Abnormal   Collection  Time    04/17/13  5:45 PM      Result Value Range   WBC 7.4  4.0 - 10.5 K/uL   RBC 4.73  3.87 - 5.11 MIL/uL   Hemoglobin 15.2 (*) 12.0 - 15.0 g/dL   HCT 78.2  95.6  - 21.3 %   MCV 91.3  78.0 - 100.0 fL   MCH 32.1  26.0 - 34.0 pg   MCHC 35.2  30.0 - 36.0 g/dL   RDW 08.6  57.8 - 46.9 %   Platelets 296  150 - 400 K/uL   Neutrophils Relative % 47  43 - 77 %   Neutro Abs 3.4  1.7 - 7.7 K/uL   Lymphocytes Relative 44  12 - 46 %   Lymphs Abs 3.2  0.7 - 4.0 K/uL   Monocytes Relative 8  3 - 12 %   Monocytes Absolute 0.6  0.1 - 1.0 K/uL   Eosinophils Relative 2  0 - 5 %   Eosinophils Absolute 0.1  0.0 - 0.7 K/uL   Basophils Relative 0  0 - 1 %   Basophils Absolute 0.0  0.0 - 0.1 K/uL  POCT URINALYSIS DIP (DEVICE)     Status: None   Collection Time    04/17/13  7:02 PM      Result Value Range   Glucose, UA NEGATIVE  NEGATIVE mg/dL   Bilirubin Urine NEGATIVE  NEGATIVE   Ketones, ur NEGATIVE  NEGATIVE mg/dL   Specific Gravity, Urine >=1.030  1.005 - 1.030   Hgb urine dipstick NEGATIVE  NEGATIVE   pH 6.0  5.0 - 8.0   Protein, ur NEGATIVE  NEGATIVE mg/dL   Urobilinogen, UA 0.2  0.0 - 1.0 mg/dL   Nitrite NEGATIVE  NEGATIVE   Leukocytes, UA NEGATIVE  NEGATIVE      MDM   1. Abdominal pain   2. PCOS (polycystic ovarian syndrome)    27yo F w/ abdominal pain of uncertain etiology but likely related to either viral gastroenteritis or polycystic ovaries. No sign of acute abdomen, pancreatitis, cholecystitis, UTI , appendicitis.  - Ibuprofen 6-800mg  Q6 - fluids and rest.  - precautions givena nd all questions answered -   Shelly Flatten, MD Family Medicine PGY-3 04/17/2013, 7:19 PM      Ozella Rocks, MD 04/17/13 [id number]

## 2013-04-17 NOTE — ED Provider Notes (Signed)
Medical screening examination/treatment/procedure(s) were performed by a resident physician and as supervising physician I was immediately available for consultation/collaboration.  Leslee Home, M.D.  Reuben Likes, MD 04/17/13 [phone]

## 2013-04-17 NOTE — ED Notes (Signed)
Pt c/o RUQ abd pain onset last night Reports she saw her companies medical dr today Denies: f/v/n/d, urinary sxs, gyn sxs Alert w/no signs of acute distress.

## 2013-07-06 ENCOUNTER — Encounter (HOSPITAL_COMMUNITY): Payer: Self-pay | Admitting: Emergency Medicine

## 2013-07-06 ENCOUNTER — Emergency Department (HOSPITAL_COMMUNITY): Payer: 59

## 2013-07-06 ENCOUNTER — Emergency Department (HOSPITAL_COMMUNITY)
Admission: EM | Admit: 2013-07-06 | Discharge: 2013-07-06 | Disposition: A | Discharge status: Home / Self Care | Payer: 59 | Attending: Emergency Medicine | Admitting: Emergency Medicine

## 2013-07-06 DIAGNOSIS — R197 Diarrhea, unspecified: Secondary | ICD-10-CM | POA: Insufficient documentation

## 2013-07-06 DIAGNOSIS — N83202 Unspecified ovarian cyst, left side: Secondary | ICD-10-CM

## 2013-07-06 DIAGNOSIS — Z8669 Personal history of other diseases of the nervous system and sense organs: Secondary | ICD-10-CM | POA: Insufficient documentation

## 2013-07-06 DIAGNOSIS — R1084 Generalized abdominal pain: Secondary | ICD-10-CM | POA: Insufficient documentation

## 2013-07-06 DIAGNOSIS — Z3202 Encounter for pregnancy test, result negative: Secondary | ICD-10-CM | POA: Insufficient documentation

## 2013-07-06 DIAGNOSIS — R109 Unspecified abdominal pain: Secondary | ICD-10-CM

## 2013-07-06 DIAGNOSIS — R112 Nausea with vomiting, unspecified: Secondary | ICD-10-CM | POA: Insufficient documentation

## 2013-07-06 DIAGNOSIS — Z79899 Other long term (current) drug therapy: Secondary | ICD-10-CM | POA: Insufficient documentation

## 2013-07-06 DIAGNOSIS — N390 Urinary tract infection, site not specified: Secondary | ICD-10-CM | POA: Insufficient documentation

## 2013-07-06 DIAGNOSIS — I10 Essential (primary) hypertension: Secondary | ICD-10-CM | POA: Insufficient documentation

## 2013-07-06 DIAGNOSIS — N83209 Unspecified ovarian cyst, unspecified side: Secondary | ICD-10-CM | POA: Insufficient documentation

## 2013-07-06 LAB — CBC WITH DIFFERENTIAL/PLATELET
BASOS ABS: 0 10*3/uL (ref 0.0–0.1)
BASOS PCT: 0 % (ref 0–1)
Eosinophils Absolute: 0.1 10*3/uL (ref 0.0–0.7)
Eosinophils Relative: 1 % (ref 0–5)
HCT: 43.7 % (ref 36.0–46.0)
HEMOGLOBIN: 15.4 g/dL — AB (ref 12.0–15.0)
Lymphocytes Relative: 9 % — ABNORMAL LOW (ref 12–46)
Lymphs Abs: 0.7 10*3/uL (ref 0.7–4.0)
MCH: 32 pg (ref 26.0–34.0)
MCHC: 35.2 g/dL (ref 30.0–36.0)
MCV: 90.7 fL (ref 78.0–100.0)
Monocytes Absolute: 0.3 10*3/uL (ref 0.1–1.0)
Monocytes Relative: 4 % (ref 3–12)
NEUTROS ABS: 6.4 10*3/uL (ref 1.7–7.7)
NEUTROS PCT: 86 % — AB (ref 43–77)
Platelets: 276 10*3/uL (ref 150–400)
RBC: 4.82 MIL/uL (ref 3.87–5.11)
RDW: 12.1 % (ref 11.5–15.5)
WBC: 7.5 10*3/uL (ref 4.0–10.5)

## 2013-07-06 LAB — COMPREHENSIVE METABOLIC PANEL
ALBUMIN: 4 g/dL (ref 3.5–5.2)
ALK PHOS: 53 U/L (ref 39–117)
ALT: 20 U/L (ref 0–35)
AST: 15 U/L (ref 0–37)
BILIRUBIN TOTAL: 0.5 mg/dL (ref 0.3–1.2)
BUN: 10 mg/dL (ref 6–23)
CHLORIDE: 102 meq/L (ref 96–112)
CO2: 25 mEq/L (ref 19–32)
Calcium: 8.8 mg/dL (ref 8.4–10.5)
Creatinine, Ser: 0.75 mg/dL (ref 0.50–1.10)
GFR calc Af Amer: >90 mL/min (ref >90)
GFR calc non Af Amer: >90 mL/min (ref >90)
Glucose, Bld: 126 mg/dL — ABNORMAL HIGH (ref 70–99)
POTASSIUM: 4.3 meq/L (ref 3.7–5.3)
SODIUM: 141 meq/L (ref 137–147)
Total Protein: 7.6 g/dL (ref 6.0–8.3)

## 2013-07-06 LAB — URINALYSIS, ROUTINE W REFLEX MICROSCOPIC
Bilirubin Urine: NEGATIVE
Glucose, UA: NEGATIVE mg/dL
Hgb urine dipstick: NEGATIVE
KETONES UR: NEGATIVE mg/dL
NITRITE: NEGATIVE
PH: 5.5 (ref 5.0–8.0)
PROTEIN: NEGATIVE mg/dL
Specific Gravity, Urine: 1.023 (ref 1.005–1.030)
Urobilinogen, UA: 0.2 mg/dL (ref 0.0–1.0)

## 2013-07-06 LAB — URINE MICROSCOPIC-ADD ON

## 2013-07-06 LAB — PREGNANCY, URINE: PREG TEST UR: NEGATIVE

## 2013-07-06 LAB — LIPASE, BLOOD: Lipase: 27 U/L (ref 11–59)

## 2013-07-06 MED ORDER — SODIUM CHLORIDE 0.9 % IV SOLN
1000.0000 mL | Freq: Once | INTRAVENOUS | Status: AC
  Administered 2013-07-06: 1000 mL via INTRAVENOUS

## 2013-07-06 MED ORDER — IOHEXOL 300 MG/ML  SOLN
25.0000 mL | INTRAMUSCULAR | Status: AC | PRN
  Administered 2013-07-06 (×2): 25 mL via ORAL

## 2013-07-06 MED ORDER — IOHEXOL 300 MG/ML  SOLN: 100.0000 mL | Freq: Once | INTRAMUSCULAR | Status: DC | PRN

## 2013-07-06 MED ORDER — CEPHALEXIN 500 MG PO CAPS: 500.0000 mg | ORAL_CAPSULE | Freq: Four times a day (QID) | ORAL | Status: DC

## 2013-07-06 MED ORDER — HYDROCODONE-ACETAMINOPHEN 5-325 MG PO TABS: 1.0000 | ORAL_TABLET | ORAL | Status: DC | PRN

## 2013-07-06 MED ORDER — DEXTROSE 5 % IV SOLN
1.0000 g | Freq: Once | INTRAVENOUS | Status: AC
  Administered 2013-07-06: 1 g via INTRAVENOUS
  Filled 2013-07-06: qty 10

## 2013-07-06 MED ORDER — ONDANSETRON HCL 4 MG/2ML IJ SOLN
4.0000 mg | Freq: Once | INTRAMUSCULAR | Status: AC
  Administered 2013-07-06: 4 mg via INTRAVENOUS
  Filled 2013-07-06: qty 2

## 2013-07-06 MED ORDER — ONDANSETRON 8 MG PO TBDP: 8.0000 mg | ORAL_TABLET | Freq: Three times a day (TID) | ORAL | Status: DC | PRN

## 2013-07-06 MED ORDER — IOHEXOL 300 MG/ML  SOLN
100.0000 mL | Freq: Once | INTRAMUSCULAR | Status: AC | PRN
  Administered 2013-07-06: 100 mL via INTRAVENOUS

## 2013-07-06 MED ORDER — SODIUM CHLORIDE 0.9 % IV SOLN: 1000.0000 mL | INTRAVENOUS | Status: DC

## 2013-07-06 MED ORDER — MORPHINE SULFATE 4 MG/ML IJ SOLN
6.0000 mg | Freq: Once | INTRAMUSCULAR | Status: AC
Start: 2013-07-06 — End: 2013-07-06
  Administered 2013-07-06: 6 mg via INTRAVENOUS
  Filled 2013-07-06: qty 2

## 2013-07-06 NOTE — Discharge Instructions (Signed)
Abdominal Pain, Women °Abdominal (stomach, pelvic, or belly) pain can be caused by many things. It is important to tell your doctor: °· The location of the pain. °· Does it come and go or is it present all the time? °· Are there things that start the pain (eating certain foods, exercise)? °· Are there other symptoms associated with the pain (fever, nausea, vomiting, diarrhea)? °All of this is helpful to know when trying to find the cause of the pain. °CAUSES  °· Stomach: virus or bacteria infection, or ulcer. °· Intestine: appendicitis (inflamed appendix), regional ileitis (Crohn's disease), ulcerative colitis (inflamed colon), irritable bowel syndrome, diverticulitis (inflamed diverticulum of the colon), or cancer of the stomach or intestine. °· Gallbladder disease or stones in the gallbladder. °· Kidney disease, kidney stones, or infection. °· Pancreas infection or cancer. °· Fibromyalgia (pain disorder). °· Diseases of the female organs: °· Uterus: fibroid (non-cancerous) tumors or infection. °· Fallopian tubes: infection or tubal pregnancy. °· Ovary: cysts or tumors. °· Pelvic adhesions (scar tissue). °· Endometriosis (uterus lining tissue growing in the pelvis and on the pelvic organs). °· Pelvic congestion syndrome (female organs filling up with blood just before the menstrual period). °· Pain with the menstrual period. °· Pain with ovulation (producing an egg). °· Pain with an IUD (intrauterine device, birth control) in the uterus. °· Cancer of the female organs. °· Functional pain (pain not caused by a disease, may improve without treatment). °· Psychological pain. °· Depression. °DIAGNOSIS  °Your doctor will decide the seriousness of your pain by doing an examination. °· Blood tests. °· X-rays. °· Ultrasound. °· CT scan (computed tomography, special type of X-ray). °· MRI (magnetic resonance imaging). °· Cultures, for infection. °· Barium enema (dye inserted in the large intestine, to better view it with  X-rays). °· Colonoscopy (looking in intestine with a lighted tube). °· Laparoscopy (minor surgery, looking in abdomen with a lighted tube). °· Major abdominal exploratory surgery (looking in abdomen with a large incision). °TREATMENT  °The treatment will depend on the cause of the pain.  °· Many cases can be observed and treated at home. °· Over-the-counter medicines recommended by your caregiver. °· Prescription medicine. °· Antibiotics, for infection. °· Birth control pills, for painful periods or for ovulation pain. °· Hormone treatment, for endometriosis. °· Nerve blocking injections. °· Physical therapy. °· Antidepressants. °· Counseling with a psychologist or psychiatrist. °· Minor or major surgery. °HOME CARE INSTRUCTIONS  °· Do not take laxatives, unless directed by your caregiver. °· Take over-the-counter pain medicine only if ordered by your caregiver. Do not take aspirin because it can cause an upset stomach or bleeding. °· Try a clear liquid diet (broth or water) as ordered by your caregiver. Slowly move to a bland diet, as tolerated, if the pain is related to the stomach or intestine. °· Have a thermometer and take your temperature several times a day, and record it. °· Bed rest and sleep, if it helps the pain. °· Avoid sexual intercourse, if it causes pain. °· Avoid stressful situations. °· Keep your follow-up appointments and tests, as your caregiver orders. °· If the pain does not go away with medicine or surgery, you may try: °· Acupuncture. °· Relaxation exercises (yoga, meditation). °· Group therapy. °· Counseling. °SEEK MEDICAL CARE IF:  °· You notice certain foods cause stomach pain. °· Your home care treatment is not helping your pain. °· You need stronger pain medicine. °· You want your IUD removed. °· You feel faint or   lightheaded. °· You develop nausea and vomiting. °· You develop a rash. °· You are having side effects or an allergy to your medicine. °SEEK IMMEDIATE MEDICAL CARE IF:  °· Your  pain does not go away or gets worse. °· You have a fever. °· Your pain is felt only in portions of the abdomen. The right side could possibly be appendicitis. The left lower portion of the abdomen could be colitis or diverticulitis. °· You are passing blood in your stools (bright red or black tarry stools, with or without vomiting). °· You have blood in your urine. °· You develop chills, with or without a fever. °· You pass out. °MAKE SURE YOU:  °· Understand these instructions. °· Will watch your condition. °· Will get help right away if you are not doing well or get worse. °Document Released: 02/20/2007 Document Revised: 07/18/2011 Document Reviewed: 03/12/2009 °ExitCare® Patient Information ©2014 [employer]. ° °

## 2013-07-06 NOTE — ED Provider Notes (Signed)
CSN: [id number]     Arrival date & time 07/06/13  0620 History   First MD Initiated Contact with Patient 07/06/13 [phone]     Chief Complaint  Patient presents with  . Abdominal Pain     HPI Patient reports developing nausea vomiting and some diarrhea over the past 24-36 hours.  She states generalized abdominal pain without focality.  No hematemesis.  No melena or hematochezia.  No fevers or chills.  She's never had symptoms like this before.  She still has all of her intra-abdominal organs.  She and her husband are currently trying to conceive.  She started her menstrual cycle today.  Patient has a history of polycystic ovarian disease.  She also has a history of hypertension and migraines.  Her pain is mild to moderate in severity at this time.  Mild decreased oral intake.   Past Medical History  Diagnosis Date  . Hypertension   . Epilepsy     Last seizure 3 weeks ago  . Polycystic disease, ovaries   . Migraines    Past Surgical History  Procedure Laterality Date  . Tonsillectomy     Family History  Problem Relation Age of Onset  . Diabetes Mother   . Heart failure Mother   . Hypertension Mother   . Hypertension Father    History  Substance Use Topics  . Smoking status: Never Smoker   . Smokeless tobacco: Never Used  . Alcohol Use: No   OB History   Grav Para Term Preterm Abortions TAB SAB Ect Mult Living   0              Review of Systems  All other systems reviewed and are negative.      Allergies  Codeine  Home Medications   Current Outpatient Rx  Name  Route  Sig  Dispense  Refill  . Biotin 1000 MCG tablet   Oral   Take 1,000 mcg by mouth at bedtime.          . folic acid (FOLVITE) 400 MCG tablet   Oral   Take 400 mcg by mouth daily.         Marland Kitchen letrozole (FEMARA) 2.5 MG tablet   Oral   Take 2 tablets by mouth daily.         Marland Kitchen omeprazole (PRILOSEC) 20 MG capsule   Oral   Take 20 mg by mouth daily as needed. For heartburn          .  Prenat w/o A-FE-DSS-Methfol-FA (PRENATAL MULTIVITAMIN) 90-600-400 MG-MCG-MCG tablet   Oral   Take 1 tablet by mouth at bedtime.          . cephALEXin (KEFLEX) 500 MG capsule   Oral   Take 1 capsule (500 mg total) by mouth 4 (four) times daily.   20 capsule   0   . HYDROcodone-acetaminophen (NORCO/VICODIN) 5-325 MG per tablet   Oral   Take 1 tablet by mouth every 4 (four) hours as needed for moderate pain.   12 tablet   0   . ondansetron (ZOFRAN ODT) 8 MG disintegrating tablet   Oral   Take 1 tablet (8 mg total) by mouth every 8 (eight) hours as needed for nausea or vomiting.   10 tablet   0    BP 104/64  Pulse 87  Temp(Src) 99 F (37.2 C) (Oral)  Resp 20  Wt 206 lb (93.441 kg)  SpO2 99%  LMP 07/06/2013 Physical Exam  Nursing note and  vitals reviewed. Constitutional: She is oriented to person, place, and time. She appears well-developed and well-nourished. No distress.  HENT:  Head: Normocephalic and atraumatic.  Eyes: EOM are normal.  Neck: Normal range of motion.  Cardiovascular: Normal rate, regular rhythm and normal heart sounds.   Pulmonary/Chest: Effort normal and breath sounds normal.  Abdominal: Soft. She exhibits no distension.  Mild generalized abdominal tenderness with some periumbilical tenderness  Musculoskeletal: Normal range of motion.  Neurological: She is alert and oriented to person, place, and time.  Skin: Skin is warm and dry.  Psychiatric: She has a normal mood and affect. Judgment normal.    ED Course  Procedures (including critical care time) Labs Review Labs Reviewed  URINALYSIS, ROUTINE W REFLEX MICROSCOPIC - Abnormal; Notable for the following:    APPearance CLOUDY (*)    Leukocytes, UA LARGE (*)    All other components within normal limits  CBC WITH DIFFERENTIAL - Abnormal; Notable for the following:    Hemoglobin 15.4 (*)    Neutrophils Relative % 86 (*)    Lymphocytes Relative 9 (*)    All other components within normal limits   COMPREHENSIVE METABOLIC PANEL - Abnormal; Notable for the following:    Glucose, Bld 126 (*)    All other components within normal limits  URINE MICROSCOPIC-ADD ON - Abnormal; Notable for the following:    Squamous Epithelial / LPF MANY (*)    Bacteria, UA FEW (*)    All other components within normal limits  URINE CULTURE  PREGNANCY, URINE  LIPASE, BLOOD   Imaging Review Ct Abdomen Pelvis W Contrast  07/06/2013   CLINICAL DATA:  Abdominal pain with nausea vomiting since last night on the last menstrual period is today, negative pregnancy test  EXAM: CT ABDOMEN AND PELVIS WITH CONTRAST  TECHNIQUE: Multidetector CT imaging of the abdomen and pelvis was performed using the standard protocol following bolus administration of intravenous contrast.  CONTRAST:  100mL OMNIPAQUE IOHEXOL 300 MG/ML  SOLN  COMPARISON:  DG ABD 1 VIEW dated 04/17/2013; US PELVIS COMPLETE dated 09/20/2012  FINDINGS: Visualized lung bases are clear.  No acute musculoskeletal findings.  Liver, gallbladder, spleen, pancreas, kidneys, and adrenal glands are normal. Bowel is normal. Appendix is normal.  Bladder is normal. There is no ascites. There is no significant adenopathy. Uterus and right adnexa are normal. In the left adnexa, there is a circumscribed peripherally enhancing ovoid cystic lesion measuring 32 x 37 x 28 mm.  IMPRESSION: Left adnexal cystic lesion likely representing an ovarian cyst. Consider pelvic ultrasound to better characterize.   Electronically Signed   By: Esperanza Heiraymond  Rubner M.D.   On: 07/06/2013 11:06  I personally reviewed the imaging tests through PACS system I reviewed available ER/hospitalization records through the EMR    EKG Interpretation None      MDM   Final diagnoses:  Abdominal pain  Nausea & vomiting  Urinary tract infection  Left ovarian cyst    Patient feels better at this time.  Discharge home in good condition.  CT scan without acute abdominal pathology except for a left ovarian  cyst.  I do not believe this is pain related to her left ovarian cyst.  I do not believe this is ovarian portion.  She does have a significant amount white blood cells as well as leukocytes in her urine.  She'll be covered with antibiotics.  Dose of Rocephin in the ER.  Urine culture sent.  Close PCP followup.  Discharge home in good  condition with pain medicine nausea medicine and antibiotics.  She understands to return to the ER for new or worsening symptoms    Lyanne Co, MD 07/06/13 1154

## 2013-07-06 NOTE — ED Notes (Signed)
The pt is c/o abd pain with nv and vomiting since last night.  lmp today.  She had diarrhea Wednesday and yesterday

## 2013-07-06 NOTE — ED Notes (Signed)
Patient completed contrast.

## 2013-07-06 NOTE — ED Notes (Signed)
PT became lightheaded and dizzy. BP and HR documented.PT placed in [place].

## 2013-07-06 NOTE — ED Notes (Signed)
MD at Bedside.

## 2013-07-07 LAB — URINE CULTURE

## 2013-09-24 ENCOUNTER — Telehealth: Payer: Self-pay | Admitting: Neurology

## 2013-09-24 NOTE — Telephone Encounter (Signed)
Lesly RubensteinJade, will you call patient and tell her that I got letter from [employer] and they thought that she wasn't taking antiseizure medication (topamax) very faithfully.  Please stress importance of compliance with the medication.  Thanks.

## 2013-09-24 NOTE — Telephone Encounter (Signed)
Left message on machine for patient to call back.

## 2013-09-25 NOTE — Telephone Encounter (Signed)
Pt returning your call please leave her a message in what this is about per patient

## 2013-09-25 NOTE — Telephone Encounter (Signed)
Left another message for patient to call back 

## 2013-09-25 NOTE — Telephone Encounter (Signed)
Pt returned your call please call [phone]

## 2013-09-25 NOTE — Telephone Encounter (Signed)
Spoke with patient and she states she stopped the medication 5 months ago - she is seeing a fertility specialist and trying to get pregnant. She was not interested in talking about going back on the medication.

## 2013-09-26 ENCOUNTER — Telehealth: Payer: Self-pay | Admitting: Neurology

## 2013-09-26 NOTE — Telephone Encounter (Signed)
LMOM making patient aware we have a specialist here and encouraging her to call back and make an appt with her with our front desk.

## 2013-09-26 NOTE — Telephone Encounter (Signed)
Pt is returning your call please call °

## 2013-09-26 NOTE — Telephone Encounter (Signed)
Patient just needs an appt. Please see previous note.

## 2013-09-26 NOTE — Telephone Encounter (Signed)
Okay.  She and I talked about this in the past.  This is not a decision I necessarily agree with though.  Do you think she would be willing to see an epileptologist Karel Jarvis([place]) to get her opinion?

## 2014-04-11 LAB — OB RESULTS CONSOLE GC/CHLAMYDIA
Chlamydia: NEGATIVE
Gonorrhea: NEGATIVE

## 2014-04-11 LAB — OB RESULTS CONSOLE HEPATITIS B SURFACE ANTIGEN: HEP B S AG: NEGATIVE

## 2014-04-11 LAB — OB RESULTS CONSOLE RUBELLA ANTIBODY, IGM: RUBELLA: IMMUNE

## 2014-04-11 LAB — OB RESULTS CONSOLE RPR: RPR: NONREACTIVE

## 2014-04-11 LAB — OB RESULTS CONSOLE HIV ANTIBODY (ROUTINE TESTING): HIV: NONREACTIVE

## 2014-05-09 DIAGNOSIS — Z8679 Personal history of other diseases of the circulatory system: Secondary | ICD-10-CM

## 2014-05-09 HISTORY — DX: Personal history of other diseases of the circulatory system: Z86.79

## 2014-05-09 NOTE — L&D Delivery Note (Signed)
Delivery Note At 9:51 PM a viable and healthy female was delivered via  (Presentation: LOA  ).  APGAR: 8, 9; weight  pending .   Placenta status: spontaneous, intact.  Cord:  with the following complications: none.  Cord pH: na  Anesthesia:  epidural Episiotomy:  none Lacerations:  second Suture Repair: 2.0 vicryl rapide Est. Blood Loss (mL):  200  Mom to postpartum.  Baby to NICU.  Brooke [name] J 09/22/2014, 10:04 PM

## 2014-06-26 ENCOUNTER — Inpatient Hospital Stay (HOSPITAL_COMMUNITY)
Admission: AD | Admit: 2014-06-26 | Discharge: 2014-06-30 | DRG: 781 | Disposition: A | Discharge status: Home / Self Care | Payer: 59 | Source: Ambulatory Visit | Attending: Obstetrics and Gynecology | Admitting: Obstetrics and Gynecology

## 2014-06-26 ENCOUNTER — Inpatient Hospital Stay (HOSPITAL_COMMUNITY)
Admission: AD | Admit: 2014-06-26 | Discharge: 2014-06-26 | Disposition: A | Discharge status: Home / Self Care | Payer: 59 | Attending: Obstetrics and Gynecology | Admitting: Obstetrics and Gynecology

## 2014-06-26 ENCOUNTER — Inpatient Hospital Stay (HOSPITAL_COMMUNITY): Payer: 59

## 2014-06-26 ENCOUNTER — Encounter (HOSPITAL_COMMUNITY)
Admission: AD | Disposition: A | Discharge status: Home / Self Care | Payer: Self-pay | Source: Ambulatory Visit | Attending: Obstetrics and Gynecology

## 2014-06-26 ENCOUNTER — Encounter (HOSPITAL_COMMUNITY): Payer: Self-pay | Admitting: *Deleted

## 2014-06-26 ENCOUNTER — Inpatient Hospital Stay (HOSPITAL_COMMUNITY): Payer: 59 | Admitting: Anesthesiology

## 2014-06-26 DIAGNOSIS — O99342 Other mental disorders complicating pregnancy, second trimester: Secondary | ICD-10-CM | POA: Diagnosis present

## 2014-06-26 DIAGNOSIS — Z3A22 22 weeks gestation of pregnancy: Secondary | ICD-10-CM | POA: Diagnosis present

## 2014-06-26 DIAGNOSIS — O3432 Maternal care for cervical incompetence, second trimester: Principal | ICD-10-CM | POA: Diagnosis present

## 2014-06-26 DIAGNOSIS — F419 Anxiety disorder, unspecified: Secondary | ICD-10-CM | POA: Diagnosis present

## 2014-06-26 DIAGNOSIS — O3482 Maternal care for other abnormalities of pelvic organs, second trimester: Secondary | ICD-10-CM | POA: Diagnosis present

## 2014-06-26 DIAGNOSIS — N883 Incompetence of cervix uteri: Secondary | ICD-10-CM | POA: Diagnosis present

## 2014-06-26 DIAGNOSIS — G40909 Epilepsy, unspecified, not intractable, without status epilepticus: Secondary | ICD-10-CM | POA: Diagnosis present

## 2014-06-26 DIAGNOSIS — O10012 Pre-existing essential hypertension complicating pregnancy, second trimester: Secondary | ICD-10-CM | POA: Diagnosis present

## 2014-06-26 DIAGNOSIS — G43909 Migraine, unspecified, not intractable, without status migrainosus: Secondary | ICD-10-CM | POA: Diagnosis present

## 2014-06-26 DIAGNOSIS — O09819 Supervision of pregnancy resulting from assisted reproductive technology, unspecified trimester: Secondary | ICD-10-CM | POA: Insufficient documentation

## 2014-06-26 DIAGNOSIS — O99352 Diseases of the nervous system complicating pregnancy, second trimester: Secondary | ICD-10-CM | POA: Diagnosis present

## 2014-06-26 DIAGNOSIS — E282 Polycystic ovarian syndrome: Secondary | ICD-10-CM | POA: Diagnosis present

## 2014-06-26 HISTORY — DX: Anxiety disorder, unspecified: F41.9

## 2014-06-26 HISTORY — PX: CERVICAL CERCLAGE: SHX1329

## 2014-06-26 LAB — CBC
HEMATOCRIT: 38.7 % (ref 36.0–46.0)
Hemoglobin: 13.4 g/dL (ref 12.0–15.0)
MCH: 31.5 pg (ref 26.0–34.0)
MCHC: 34.6 g/dL (ref 30.0–36.0)
MCV: 90.8 fL (ref 78.0–100.0)
PLATELETS: 256 10*3/uL (ref 150–400)
RBC: 4.26 MIL/uL (ref 3.87–5.11)
RDW: 12.7 % (ref 11.5–15.5)
WBC: 10.9 10*3/uL — ABNORMAL HIGH (ref 4.0–10.5)

## 2014-06-26 LAB — URINALYSIS, ROUTINE W REFLEX MICROSCOPIC
Bilirubin Urine: NEGATIVE
GLUCOSE, UA: NEGATIVE mg/dL
Hgb urine dipstick: NEGATIVE
Ketones, ur: 40 mg/dL — AB
LEUKOCYTES UA: NEGATIVE
Nitrite: NEGATIVE
PROTEIN: NEGATIVE mg/dL
Urobilinogen, UA: 0.2 mg/dL (ref 0.0–1.0)
pH: 6 (ref 5.0–8.0)

## 2014-06-26 LAB — TYPE AND SCREEN
ABO/RH(D): B POS
ANTIBODY SCREEN: NEGATIVE

## 2014-06-26 LAB — OB RESULTS CONSOLE GBS: GBS: NEGATIVE

## 2014-06-26 LAB — ABO/RH: ABO/RH(D): B POS

## 2014-06-26 SURGERY — CERCLAGE, CERVIX, VAGINAL APPROACH
Anesthesia: Spinal | Site: Vagina

## 2014-06-26 MED ORDER — PHENYLEPHRINE 40 MCG/ML (10ML) SYRINGE FOR IV PUSH (FOR BLOOD PRESSURE SUPPORT)
PREFILLED_SYRINGE | INTRAVENOUS | Status: AC
  Filled 2014-06-26: qty 10

## 2014-06-26 MED ORDER — NITROGLYCERIN 0.2 MG/ML ON CALL CATH LAB
INTRAVENOUS | Status: DC | PRN
  Administered 2014-06-26: 400 ug via INTRAVENOUS

## 2014-06-26 MED ORDER — DEXTROSE IN LACTATED RINGERS 5 % IV SOLN
INTRAVENOUS | Status: DC
  Administered 2014-06-26 (×2): via INTRAVENOUS

## 2014-06-26 MED ORDER — ACETAMINOPHEN 325 MG PO TABS
650.0000 mg | ORAL_TABLET | ORAL | Status: DC | PRN
  Administered 2014-06-27 – 2014-06-28 (×3): 650 mg via ORAL
  Filled 2014-06-26 (×3): qty 2

## 2014-06-26 MED ORDER — CEFAZOLIN SODIUM-DEXTROSE 2-3 GM-% IV SOLR
INTRAVENOUS | Status: AC
  Filled 2014-06-26: qty 50

## 2014-06-26 MED ORDER — PHENYLEPHRINE HCL 10 MG/ML IJ SOLN
INTRAMUSCULAR | Status: DC | PRN
  Administered 2014-06-26 (×2): 40 ug via INTRAVENOUS
  Administered 2014-06-26: 80 ug via INTRAVENOUS
  Administered 2014-06-26 (×3): 40 ug via INTRAVENOUS

## 2014-06-26 MED ORDER — PROPOFOL 10 MG/ML IV BOLUS
INTRAVENOUS | Status: AC
  Filled 2014-06-26: qty 40

## 2014-06-26 MED ORDER — CITRIC ACID-SODIUM CITRATE 334-500 MG/5ML PO SOLN
ORAL | Status: AC
  Administered 2014-06-26: 15:00:00
  Filled 2014-06-26: qty 15

## 2014-06-26 MED ORDER — MEPERIDINE HCL 25 MG/ML IJ SOLN: 6.2500 mg | INTRAMUSCULAR | Status: DC | PRN

## 2014-06-26 MED ORDER — DOCUSATE SODIUM 100 MG PO CAPS
100.0000 mg | ORAL_CAPSULE | Freq: Every day | ORAL | Status: DC
  Administered 2014-06-27 – 2014-06-30 (×4): 100 mg via ORAL
  Filled 2014-06-26 (×4): qty 1

## 2014-06-26 MED ORDER — CALCIUM CARBONATE ANTACID 500 MG PO CHEW
2.0000 | CHEWABLE_TABLET | ORAL | Status: DC | PRN
  Administered 2014-06-27 – 2014-06-28 (×2): 400 mg via ORAL
  Filled 2014-06-26 (×2): qty 1

## 2014-06-26 MED ORDER — ZOLPIDEM TARTRATE 5 MG PO TABS
5.0000 mg | ORAL_TABLET | Freq: Every evening | ORAL | Status: DC | PRN
  Administered 2014-06-26 – 2014-06-28 (×2): 5 mg via ORAL
  Filled 2014-06-26 (×3): qty 1

## 2014-06-26 MED ORDER — CEFAZOLIN SODIUM-DEXTROSE 2-3 GM-% IV SOLR
INTRAVENOUS | Status: DC | PRN
  Administered 2014-06-26: 2 g via INTRAVENOUS

## 2014-06-26 MED ORDER — BUPIVACAINE IN DEXTROSE 0.75-8.25 % IT SOLN
INTRATHECAL | Status: DC | PRN
  Administered 2014-06-26: 1.3 mL via INTRATHECAL

## 2014-06-26 MED ORDER — METOCLOPRAMIDE HCL 5 MG/ML IJ SOLN: 10.0000 mg | Freq: Once | INTRAMUSCULAR | Status: DC | PRN

## 2014-06-26 MED ORDER — FENTANYL CITRATE 0.05 MG/ML IJ SOLN: 25.0000 ug | INTRAMUSCULAR | Status: DC | PRN

## 2014-06-26 MED ORDER — LACTATED RINGERS IV SOLN
INTRAVENOUS | Status: DC | PRN
  Administered 2014-06-26 (×2): via INTRAVENOUS

## 2014-06-26 MED ORDER — 0.9 % SODIUM CHLORIDE (POUR BTL) OPTIME
TOPICAL | Status: DC | PRN
  Administered 2014-06-26: 1000 mL

## 2014-06-26 MED ORDER — CITRIC ACID-SODIUM CITRATE 334-500 MG/5ML PO SOLN
30.0000 mL | Freq: Once | ORAL | Status: AC
  Administered 2014-06-26: 30 mL via ORAL

## 2014-06-26 MED ORDER — LIDOCAINE HCL (CARDIAC) 20 MG/ML IV SOLN
INTRAVENOUS | Status: DC | PRN
  Administered 2014-06-26: 40 mg via INTRAVENOUS

## 2014-06-26 MED ORDER — PROPOFOL 10 MG/ML IV BOLUS
INTRAVENOUS | Status: DC | PRN
  Administered 2014-06-26: 10 mg via INTRAVENOUS
  Administered 2014-06-26 (×10): 20 mg via INTRAVENOUS
  Administered 2014-06-26: 10 mg via INTRAVENOUS
  Administered 2014-06-26 (×2): 20 mg via INTRAVENOUS

## 2014-06-26 MED ORDER — PROGESTERONE MICRONIZED 200 MG PO CAPS
200.0000 mg | ORAL_CAPSULE | Freq: Every day | ORAL | Status: DC
  Administered 2014-06-26 – 2014-06-29 (×4): 200 mg via VAGINAL
  Filled 2014-06-26 (×4): qty 1

## 2014-06-26 MED ORDER — LIDOCAINE HCL (CARDIAC) 20 MG/ML IV SOLN
INTRAVENOUS | Status: AC
  Filled 2014-06-26: qty 5

## 2014-06-26 MED ORDER — INDOMETHACIN 50 MG RE SUPP
50.0000 mg | Freq: Once | RECTAL | Status: AC
  Administered 2014-06-26: 50 mg via RECTAL
  Filled 2014-06-26: qty 1

## 2014-06-26 MED ORDER — PRENATAL MULTIVITAMIN CH
1.0000 | ORAL_TABLET | Freq: Every day | ORAL | Status: DC
  Administered 2014-06-27 – 2014-06-30 (×4): 1 via ORAL
  Filled 2014-06-26 (×4): qty 1

## 2014-06-26 SURGICAL SUPPLY — 19 items
CLOTH BEACON ORANGE TIMEOUT ST (SAFETY) ×3 IMPLANT
COUNTER NEEDLE 1200 MAGNETIC (NEEDLE) ×2 IMPLANT
GLOVE BIO SURGEON STRL SZ7.5 (GLOVE) ×3 IMPLANT
GOWN STRL REUS W/TWL LRG LVL3 (GOWN DISPOSABLE) ×9 IMPLANT
NEEDLE MAYO .5 CIRCLE (NEEDLE) ×3 IMPLANT
NS IRRIG 1000ML POUR BTL (IV SOLUTION) ×3 IMPLANT
PACK VAGINAL MINOR WOMEN LF (CUSTOM PROCEDURE TRAY) ×3 IMPLANT
PAD OB MATERNITY 4.3X12.25 (PERSONAL CARE ITEMS) ×3 IMPLANT
PAD PREP 24X48 CUFFED NSTRL (MISCELLANEOUS) ×3 IMPLANT
SLEEVE SCD COMPRESS KNEE MED (MISCELLANEOUS) ×2 IMPLANT
SUT ETHIBOND  5 (SUTURE) ×2
SUT ETHIBOND 5 (SUTURE) ×1 IMPLANT
SUT PROLENE 0 CT 1 30 (SUTURE) ×3 IMPLANT
TOWEL OR 17X24 6PK STRL BLUE (TOWEL DISPOSABLE) ×6 IMPLANT
TRAY FOLEY CATH 14FR (SET/KITS/TRAYS/PACK) ×1 IMPLANT
TUBING NON-CON 1/4 X 20 CONN (TUBING) IMPLANT
TUBING NON-CON 1/4 X 20' CONN (TUBING)
WATER STERILE IRR 1000ML POUR (IV SOLUTION) ×3 IMPLANT
YANKAUER SUCT BULB TIP NO VENT (SUCTIONS) IMPLANT

## 2014-06-26 NOTE — Consult Note (Signed)
Maternal Fetal Medicine Consultation  Requesting Provider(s): Brooke Mackieichard Taavon, MD  Reason for consultation: Cervical shortening, suspected incompetent cervix  HPI: Brooke [name] is a 29 yo G1P0 EDD 10/31/2014 currently at 21w 6d who is currently admitted due to suspected cervical insufficiency.  Brooke [name] reports that she noted increased vaginal pressure while driving to work earlier today that prompted her evaluation.  Clinic ultrasound showed a shortened cervix of approximately 1 cm with funneling.  She is now admitted for possible cervical cerclage.  She denies vaginal bleeding, leakage of fluid, vaginal discharge or uterine contractions.  Ms Brooke [name] has a history of seizures since age 29.  Prior to pregnancy she was on Topamax which was discontinued after conception.  Her last seizure was about a month ago.  She reports that her seizures are "stress related" and that her neurologist is aware that she is off medications.  She also reports a history of glucosuria but has not been given a diagnosis of gestational diabetes.  Her prenatal course has otherwise been uncomplicated.   OB History: OB History    Gravida Para Term Preterm AB TAB SAB Ectopic Multiple Living   1               PMH:  Past Medical History  Diagnosis Date  . Hypertension   . Epilepsy     Last seizure 3 weeks ago  . Polycystic disease, ovaries   . Migraines   . Anxiety     PSH:  Past Surgical History  Procedure Laterality Date  . Tonsillectomy     Meds:  Scheduled Meds: . docusate sodium  100 mg Oral Daily  . prenatal multivitamin  1 tablet Oral Q1200  . progesterone  200 mg Vaginal QHS   Continuous Infusions: . dextrose 5% lactated ringers 125 mL/hr at 06/26/14 1345   PRN Meds:.acetaminophen, calcium carbonate, zolpidem  Allergies:  Allergies  Allergen Reactions  . Codeine Itching   FH:  Family History  Problem Relation Age of Onset  . Diabetes Mother   . Hypertension  Mother   . Hypertension Father   . Alcohol abuse Maternal Grandmother   . Hearing loss Paternal Grandfather    Soc:  History   Social History  . Marital Status: Married    Spouse Name: N/A  . Number of Children: N/A  . Years of Education: N/A   Occupational History  . Not on file.   Social History Main Topics  . Smoking status: Never Smoker   . Smokeless tobacco: Never Used  . Alcohol Use: No  . Drug Use: No  . Sexual Activity:    Partners: Male    Birth Control/ Protection: Pill   Other Topics Concern  . Not on file   Social History Narrative   Married: no kids   Regular exercise: not at this time   Caffeine use: no    Review of Systems: no vaginal bleeding or cramping/contractions, no LOF, no nausea/vomiting. All other systems reviewed and are negative.  PE:   Filed Vitals:   06/26/14 1135  BP: 138/80  Pulse: 98  Temp: 98.8 F (37.1 C)  Resp: 18    GEN: well-appearing female ABD: gravid, NT  Ultrasound: Single IUP at 21w 6d Limited ultrasound performed for cervical length TVUS - cervical length 1 cm.  V-shaped funneling noted.  Some cervical debris is noted.  Labs: CBC    Component Value Date/Time   WBC 10.9* 06/26/2014 1305   RBC 4.26 06/26/2014 1305  HGB 13.4 06/26/2014 1305   HCT 38.7 06/26/2014 1305   PLT 256 06/26/2014 1305   MCV 90.8 06/26/2014 1305   MCH 31.5 06/26/2014 1305   MCHC 34.6 06/26/2014 1305   RDW 12.7 06/26/2014 1305   LYMPHSABS 0.7 07/06/2013 0636   MONOABS 0.3 07/06/2013 0636   EOSABS 0.1 07/06/2013 0636   BASOSABS 0.0 07/06/2013 0636    A/P: 1) Single IUP at 21w 6d         2) Shortened cervix - ultrasound findings were discussed with the patient.  We briefly reviewed management options.  We discussed the option of continued cervical length surveillance and vaginal progesterone vs ultrasound indicated cerclage now.  She is aware that the risk of further cervical length surveillance is that she could potentially develop  worsening cervical length shortening that may could make cerclage either technically difficult and less likely to succeed.  We also reviewed the risks of cerclage to include pain, bleeding, infection, ruptured membranes and the risk that the procedure may not have the desired effect and that she could go onto delivery preterm or previable.  After counseling, the patient has elected to proceed with cerclage.          3) Hx of seizure disorder - in general, the risk of ante-epileptic drugs (AEDs) is far less than the risk of recurrent seizures.  Given her history and the fact that she is now out of the first trimester, it may be reasonable to offer a follow up neurology evaluation due to seizure disorders.  Feel that this may be offered as an outpatient after the planned procedure.  Recommendations: 1) Feel that the patient is a candidate for ultrasound indicated cerclage 2) My general practice is to administer Indocin 25 mg q 8 hr for 48 hrs post procedure.  3) Would also give vaginal progesterone supplements post procedure 4) Follow up cervical length in 1 week following the procedure. 5) Would offer a follow up consultation with neurology as an outpatient due to history of seizure disorder.   Thank you for the opportunity to be a part of the care of Brooke [name]. Please contact our office if we can be of further assistance.   I spent approximately 30 minutes with this patient with over 50% of time spent in face-to-face counseling.  Alpha Gula, MD Maternal Fetal Medicine

## 2014-06-26 NOTE — Progress Notes (Signed)
Patient ID: Brooke [name], female   DOB: 1985-12-11, 29 y.o.   MRN: [id number] Patient seen and examined. Consent witnessed and signed. No changes noted. Update completed. CBC    Component Value Date/Time   WBC 10.9* 06/26/2014 1305   RBC 4.26 06/26/2014 1305   HGB 13.4 06/26/2014 1305   HCT 38.7 06/26/2014 1305   PLT 256 06/26/2014 1305   MCV 90.8 06/26/2014 1305   MCH 31.5 06/26/2014 1305   MCHC 34.6 06/26/2014 1305   RDW 12.7 06/26/2014 1305   LYMPHSABS 0.7 07/06/2013 0636   MONOABS 0.3 07/06/2013 0636   EOSABS 0.1 07/06/2013 0636   BASOSABS 0.0 07/06/2013 0636

## 2014-06-26 NOTE — Anesthesia Postprocedure Evaluation (Signed)
  Anesthesia Post-op Note  Patient: Brooke [name]  Procedure(s) Performed: Procedure(s): CERCLAGE CERVICAL (N/A)  Patient Location: PACU  Anesthesia Type:Spinal  Level of Consciousness: awake, alert  and oriented  Airway and Oxygen Therapy: Patient Spontanous Breathing  Post-op Pain: none  Post-op Assessment: Post-op Vital signs reviewed, Patient's Cardiovascular Status Stable, Respiratory Function Stable, Patent Airway, No signs of Nausea or vomiting, Pain level controlled, No headache, No backache, No residual numbness and No residual motor weakness  Post-op Vital Signs: Reviewed and stable  Last Vitals:  Filed Vitals:   06/26/14 1645  BP: 127/73  Pulse: 95  Temp:   Resp: 21    Complications: No apparent anesthesia complications

## 2014-06-26 NOTE — Op Note (Signed)
06/26/2014  4:20 PM  PATIENT:  Brooke [name]  29 y.o. female  PRE-OPERATIVE DIAGNOSIS:  CERVICAL INSUFFICIENCY  POST-OPERATIVE DIAGNOSIS:  CERVICAL INSUFFICIENCY  PROCEDURE:  Procedure(s): CERCLAGE CERVICAL- RESCUE MCDONALD  SURGEON:  Surgeon(s): Lenoard Adenichard J Raygan Skarda, MD Genia DelMarie-Lyne Lavoie, MD  ASSISTANTS: Seymour BarsLAVOIE, MD   ANESTHESIA:   regional  ESTIMATED BLOOD LOSS: MINIMAL  DRAINS: Urinary Catheter (Foley)   LOCAL MEDICATIONS USED:  NONE  SPECIMEN:  No Specimen  DISPOSITION OF SPECIMEN:  N/A  COUNTS:  YES  DICTATION #: [id number]  PLAN OF CARE: TRANSFER TO ANTENATAL  PATIENT DISPOSITION:  PACU - hemodynamically stable.

## 2014-06-26 NOTE — Anesthesia Preprocedure Evaluation (Signed)
Anesthesia Evaluation  Patient identified by MRN, date of birth, ID band Patient awake    Reviewed: Allergy & Precautions, NPO status , Patient's Chart, lab work & pertinent test results, reviewed documented beta blocker date and time   Airway Mallampati: I  TM Distance: >3 FB Neck ROM: Full    Dental no notable dental hx. (+) Teeth Intact   Pulmonary asthma ,  breath sounds clear to auscultation  Pulmonary exam normal       Cardiovascular hypertension, Rhythm:Regular Rate:Normal     Neuro/Psych  Headaches, Seizures -, Well Controlled,  Anxiety Remote hx/o seizures negative neurological ROS     GI/Hepatic GERD-  ,  Endo/Other  Obesity PCOS  Renal/GU      Musculoskeletal negative musculoskeletal ROS (+)   Abdominal (+) + obese,   Peds  Hematology  (+) anemia ,   Anesthesia Other Findings   Reproductive/Obstetrics (+) Pregnancy [redacted] week gestation Incompetent Cervix                             Anesthesia Physical Anesthesia Plan  ASA: II  Anesthesia Plan: Spinal   Post-op Pain Management:    Induction:   Airway Management Planned: Natural Airway  Additional Equipment:   Intra-op Plan:   Post-operative Plan:   Informed Consent: I have reviewed the patients History and Physical, chart, labs and discussed the procedure including the risks, benefits and alternatives for the proposed anesthesia with the patient or authorized representative who has indicated his/her understanding and acceptance.     Plan Discussed with: Anesthesiologist  Anesthesia Plan Comments:         Anesthesia Quick Evaluation

## 2014-06-26 NOTE — Anesthesia Procedure Notes (Signed)
Spinal Patient location during procedure: OR Start time: 06/26/2014 3:21 PM Staffing Anesthesiologist: Tavion Senkbeil A. Performed by: anesthesiologist  Preanesthetic Checklist Completed: patient identified, site marked, surgical consent, pre-op evaluation, timeout performed, IV checked, risks and benefits discussed and monitors and equipment checked Spinal Block Patient position: sitting Prep: site prepped and draped and DuraPrep Patient monitoring: heart rate, cardiac monitor, continuous pulse ox and blood pressure Approach: midline Location: L3-4 Injection technique: single-shot Needle Needle type: Sprotte  Needle gauge: 24 G Needle length: 9 cm Needle insertion depth: 5 cm Assessment Sensory level: T6 Events: paresthesia Additional Notes Patient tolerated procedure well. Adequate sensory level. Transient paresthesia left foot.

## 2014-06-26 NOTE — Transfer of Care (Signed)
Immediate Anesthesia Transfer of Care Note  Patient: Brooke [name]  Procedure(s) Performed: Procedure(s): CERCLAGE CERVICAL (N/A)  Patient Location: PACU  Anesthesia Type:Spinal  Level of Consciousness: awake, alert , oriented and patient cooperative  Airway & Oxygen Therapy: Patient Spontanous Breathing and Patient connected to nasal cannula oxygen  Post-op Assessment: Report given to RN and Post -op Vital signs reviewed and stable  Post vital signs: Reviewed and stable  Last Vitals:  Filed Vitals:   06/26/14 1455  BP: 134/75  Pulse: 122  Temp:   Resp:     Complications: No apparent anesthesia complications

## 2014-06-26 NOTE — H&P (Signed)
Brooke [name] is a 29 y.o. female presenting for cervical insufficiency at 22 weeks. No bleeding. For Rescue cerclage.  Maternal Medical History:  Contractions: Onset was less than 1 hour ago.   Perceived severity is mild.    Fetal activity: Perceived fetal activity is normal.    Prenatal complications: no prenatal complications Prenatal Complications - Diabetes: none.    OB History    Gravida Para Term Preterm AB TAB SAB Ectopic Multiple Living   1              Past Medical History  Diagnosis Date  . Hypertension   . Epilepsy     Last seizure 3 weeks ago  . Polycystic disease, ovaries   . Migraines   . Anxiety    Past Surgical History  Procedure Laterality Date  . Tonsillectomy     Family History: family history includes Alcohol abuse in her maternal grandmother; Diabetes in her mother; Hearing loss in her paternal grandfather; Hypertension in her father and mother. Social History:  reports that she has never smoked. She has never used smokeless tobacco. She reports that she does not drink alcohol or use illicit drugs.   Prenatal Transfer Tool  Maternal Diabetes: No Genetic Screening: Normal Maternal Ultrasounds/Referrals: Normal Fetal Ultrasounds or other Referrals:  None Maternal Substance Abuse:  No Significant Maternal Medications:  None Significant Maternal Lab Results:  None Other Comments:  None  Review of Systems  Genitourinary: Negative for dysuria.  All other systems reviewed and are negative.     Blood pressure 134/75, pulse 122, temperature 98.8 F (37.1 C), temperature source Oral, resp. rate 18, height 5\' 5"  (1.651 m), weight 103.874 kg (229 lb). Exam Physical Exam  Constitutional: She is oriented to person, place, and time. She appears well-developed and well-nourished.  HENT:  Head: Normocephalic and atraumatic.  Eyes: Conjunctivae are normal. Pupils are equal, round, and reactive to light.  Neck: Normal range of motion. Neck  supple.  Cardiovascular: Normal rate and regular rhythm.   Respiratory: Effort normal and breath sounds normal.  GI: Soft. Bowel sounds are normal.  Genitourinary: Vagina normal and uterus normal.  Musculoskeletal: Normal range of motion.  Neurological: She is alert and oriented to person, place, and time. She has normal reflexes.  Skin: Skin is warm and dry.  Psychiatric: She has a normal mood and affect.    Prenatal labs: ABO, Rh: --/--/B POS (02/18 1305) Antibody: NEG (02/18 1305) Rubella:   RPR:    HBsAg:    HIV:    GBS:     Assessment/Plan: 21 w 6d Cervical Insufficiency Proceed with rescue cerclage. Discussed with MFM Risks discussed. Consent done.   Tiler Brandis J 06/26/2014, 3:09 PM

## 2014-06-27 ENCOUNTER — Encounter (HOSPITAL_COMMUNITY): Payer: Self-pay | Admitting: Obstetrics and Gynecology

## 2014-06-27 LAB — URINE CULTURE
CULTURE: NO GROWTH
Colony Count: NO GROWTH

## 2014-06-27 MED ORDER — INDOMETHACIN 25 MG PO CAPS
25.0000 mg | ORAL_CAPSULE | Freq: Four times a day (QID) | ORAL | Status: DC
  Administered 2014-06-27 – 2014-06-28 (×5): 25 mg via ORAL
  Filled 2014-06-27 (×6): qty 1

## 2014-06-27 NOTE — Op Note (Signed)
[name], Brooke [name]       ACCOUNT NO.:  [id number]  MEDICAL RECORD NO.:  [id number]  LOCATION:  9152                          FACILITY:  [facility]  PHYSICIAN:  Lenoard Adenichard J. Pinchas Reither, M.D.DATE OF BIRTH:  14-Nov-1985  DATE OF PROCEDURE:  06/26/2014 DATE OF DISCHARGE:                              OPERATIVE REPORT   PREOPERATIVE DIAGNOSIS:  Cervical insufficiency at 21 weeks and 6 days.  POSTOPERATIVE DIAGNOSIS:  Cervical insufficiency at 21 weeks and 6 days.  PROCEDURE:  McDonald rescue vaginal cerclage.  SURGEON:  Lenoard Adenichard J. Yailene Badia, MD  ASSISTANT:  Genia DelMarie-Lyne Lavoie, M.D.  ANESTHESIA:  Spinal by Malen GauzeFoster.  ESTIMATED BLOOD LOSS:  Less than 50 mL.  COMPLICATIONS:  None.  DRAINS:  Foley.  COUNTS:  Correct.  DISPOSITION:  The patient to recovery in good condition.  BRIEF OPERATIVE NOTE:  After being apprised of risks of anesthesia, infection, bleeding in the surrounding organs, possible need for repair, delayed versus immediate complications to include bowel and bladder injury, possible need for repair, the patient was brought to the operating room where she was administered a spinal without difficulty, placed in Trendelenburg position.  Foley catheter placed and IV nitroglycerin given for uterine relaxation at this time.  A shortened posterior cervix was visualized.  The patient was prepped and draped in usual sterile fashion.  The anterior lip of the cervix was grasped with 5 Ethibond sutures used passing the suture from 1 o'clock to 11 o'clock, then 11 o'clock to 7 o'clock, 7 o'clock to 5 o'clock, 5 o'clock to 2 o'clock and 2 o'clock to 1 o'clock, tied over Prolene suture without difficulty.  No bulging membranes were noted at the termination of the procedure.  Well approximated cervix was noted with no evidence of bulging membranes and no bleeding noted.  The patient tolerated the procedure well.  She was transferred to recovery in good condition and will remain in  antepartum for 48-72 hours with stable repeat cervical length in 48 hours.     Lenoard Adenichard J. Durward Matranga, M.D.     RJT/MEDQ  D:  06/26/2014  T:  06/27/2014  Job:  [id number]

## 2014-06-27 NOTE — Progress Notes (Signed)
Pt refusing IV fluid. Fluid stopped and saline locked.  Pt also requesting that she not be disturbed from her sleep.  She and patients mother states that interruption of sleep triggers seizures.   Pt agreed to notify RN when she awakens to assess vitals.  Scant amt of blood on peri pad at this time.  Barely feels abdominal cramping at this time.  Pt allowed to sleep.

## 2014-06-27 NOTE — Progress Notes (Signed)
Patient ID: Brooke [name], female   DOB: 14-Mar-1986, 29 y.o.   MRN: [id number] HD #2 POD#1 Cervical Insufficiency 22 wks  S: No complaints No bleeding. No LOF Good FM. No CP or SOB O: BP 111/60 mmHg  Pulse 84  Temp(Src) 98.7 F (37.1 C) (Oral)  Resp 20  Ht 5\' 5"  (1.651 m)  Wt 103.874 kg (229 lb)  BMI 38.11 kg/m2  SpO2 100%  LMP 12/12/2013 (LMP Unknown)  Neck: supple with FROM Lungs: CTA CV: RRR Abd: Gravid, NT No CVAT Pelvic deferred Neuro: non focal Skin: intact  FHTs 120-140 UCS neg GBS pending Wet prep neg  A: 22 wk IUP Cervical Insufficiency s/p rescue cerclage No s/s infection Reck CL in 48 hrs Continue Indocin x 48hrs

## 2014-06-28 LAB — CULTURE, BETA STREP (GROUP B ONLY)

## 2014-06-28 MED ORDER — METFORMIN HCL 500 MG PO TABS
500.0000 mg | ORAL_TABLET | Freq: Every day | ORAL | Status: DC
  Administered 2014-06-28 – 2014-06-29 (×2): 500 mg via ORAL
  Filled 2014-06-28 (×3): qty 1

## 2014-06-28 MED ORDER — CITRIC ACID-SODIUM CITRATE 334-500 MG/5ML PO SOLN
30.0000 mL | Freq: Once | ORAL | Status: AC
  Administered 2014-06-28: 30 mL via ORAL
  Filled 2014-06-28: qty 15

## 2014-06-28 MED ORDER — FAMOTIDINE 20 MG PO TABS
20.0000 mg | ORAL_TABLET | Freq: Every day | ORAL | Status: DC
  Administered 2014-06-28 – 2014-06-30 (×3): 20 mg via ORAL
  Filled 2014-06-28 (×3): qty 1

## 2014-06-28 MED ORDER — INDOMETHACIN 25 MG PO CAPS
25.0000 mg | ORAL_CAPSULE | Freq: Four times a day (QID) | ORAL | Status: DC
  Administered 2014-06-28 – 2014-06-30 (×8): 25 mg via ORAL
  Filled 2014-06-28 (×12): qty 1

## 2014-06-28 MED ORDER — METFORMIN HCL ER 500 MG PO TB24
500.0000 mg | ORAL_TABLET | Freq: Every day | ORAL | Status: DC
  Filled 2014-06-28: qty 1

## 2014-06-28 NOTE — Progress Notes (Signed)
Patient ID: Brooke [name], female   DOB: 1985-11-12, 29 y.o.   MRN: [id number] HD #3 POD#2 Cervical Insufficiency 22 wks 1d  S: No complaints No bleeding. No LOF Good FM. No CP or SOB or HA Occ vaginal pain, no pressure  O: BP 127/71 mmHg  Pulse 82  Temp(Src) 98.1 F (36.7 C) (Oral)  Resp 18  Ht 5\' 5"  (1.651 m)  Wt 103.874 kg (229 lb)  BMI 38.11 kg/m2  SpO2 100%  LMP 12/12/2013 (LMP Unknown)  Tmax 98.7 Neck: supple with FROM Lungs: CTA CV: RRR Abd: Gravid, NT No CVAT Pelvic closed/1cmlg/cerclage intact 360degrees Neuro: non focal Skin: intact  FHTs 120-140 UCS neg GBS pending Wet prep neg  A: 22 wk 1d IUP Cervical Insufficiency s/p rescue cerclage Remote history of Epilepsy- no meds ? CHTN - stable BP History of abnl 1gtt with nl 3gtt No s/s infection P: BR with BRP Reck CL in 2/22 Continue Indocin x 48hrs Vaginal prometrium qhs

## 2014-06-29 LAB — TYPE AND SCREEN
ABO/RH(D): B POS
Antibody Screen: NEGATIVE

## 2014-06-29 NOTE — Progress Notes (Signed)
Patient ID: Brooke [name], female   DOB: Dec 03, 1985, 29 y.o.   MRN: [id number] HD #4 POD#3 Cervical Insufficiency 22 wks 2d  S: No complaints No bleeding. No LOF Good FM. No CP or SOB or HA Occ vaginal pain, no pressure  O: BP 142/77 mmHg  Pulse 78  Temp(Src) 98.1 F (36.7 C) (Oral)  Resp 20  Ht 5\' 5"  (1.651 m)  Wt 103.874 kg (229 lb)  BMI 38.11 kg/m2  SpO2 100%  LMP 12/12/2013 (LMP Unknown)  Tmax 98.7  WDWN WF NAD Neck: supple with FROM Lungs: CTA CV: RRR Abd: Gravid, NT No CVAT Pelvic deferred Neuro: non focal Skin: intact  FHTs 120-140 UCS neg GBS pending Wet prep neg  A: 22 wk 2d IUP Cervical Insufficiency s/p rescue cerclage Remote history of Epilepsy- no meds ? CHTN - stable BP History of abnl 1gtt with nl 3gtt No s/s infection  P: BR with BRP Reck CL in 2/22 Vaginal prometrium qhs

## 2014-06-30 ENCOUNTER — Inpatient Hospital Stay (HOSPITAL_COMMUNITY): Payer: 59

## 2014-06-30 DIAGNOSIS — O09819 Supervision of pregnancy resulting from assisted reproductive technology, unspecified trimester: Secondary | ICD-10-CM | POA: Insufficient documentation

## 2014-06-30 DIAGNOSIS — O3432 Maternal care for cervical incompetence, second trimester: Secondary | ICD-10-CM | POA: Insufficient documentation

## 2014-06-30 DIAGNOSIS — Z3A22 22 weeks gestation of pregnancy: Secondary | ICD-10-CM | POA: Insufficient documentation

## 2014-06-30 MED ORDER — PROGESTERONE MICRONIZED 200 MG PO CAPS: 200.0000 mg | ORAL_CAPSULE | Freq: Every day | ORAL | Status: DC

## 2014-06-30 MED ORDER — CITRIC ACID-SODIUM CITRATE 334-500 MG/5ML PO SOLN
30.0000 mL | Freq: Once | ORAL | Status: AC
  Administered 2014-06-30: 30 mL via ORAL

## 2014-06-30 MED ORDER — CITRIC ACID-SODIUM CITRATE 334-500 MG/5ML PO SOLN
ORAL | Status: AC
  Filled 2014-06-30: qty 15

## 2014-06-30 NOTE — Discharge Instructions (Signed)
Cervical Cerclage Cervical cerclage is a surgical procedure for an incompetent cervix. An incompetent cervix is a weak cervix that opens up before labor begins. Cervical cerclage is a procedure in which the cervix is sewn closed during pregnancy.  LET [facility] CARE PROVIDER KNOW ABOUT:   Any allergies you have.  All medicines you are taking, including vitamins, herbs, eye drops, creams, and over-the-counter medicines.  Previous problems you or members of your family have had with the use of anesthetics.  Any blood disorders you have.  Previous surgeries you have had.  Medical conditions you have.  Any recent colds or infections. RISKS AND COMPLICATIONS  Generally, this is a safe procedure. However, as with any procedure, problems can occur. Possible problems include:  Infection.  Bleeding.  Rupturing the amniotic sac (membranes).  Going into early labor and delivery.  Problems with the anesthetics.  Infection of the amniotic sac. BEFORE THE PROCEDURE   Ask your health care provider about changing or stopping your medicines.  Do not eat or drink anything for 6-8 hours before the procedure.  Arrange for someone to drive you home after the procedure. PROCEDURE   An IV tube will be placed in your vein. You will be given a sedative to help you relax.  You will be given a medicine that makes you sleep through the procedure (general anesthetic) or a medicine injected into your spine that numbs your body below the waist (spinal or epidural anesthetic). You will be asleep or be numbed through the entire procedure.  A speculum will be placed in your vagina to visualize your cervix.  The cervix is then grasped and stitched closed tightly.  Ultrasound may be used to guide the procedure and monitor the baby. AFTER THE PROCEDURE   You will go to a recovery room where you and your unborn baby are monitored. Once you are awake, stable, and taking fluids well, you will be allowed  to return to your room.  You will usually stay in the hospital overnight.  You may get an injection of progesterone to prevent uterine contractions.  You may be given pain-relieving medicines to take with you when you go home.  Have someone drive you home and stay with you for up to 2 days. Document Released: 04/07/2008 Document Revised: 04/30/2013 Document Reviewed: 11/14/2012 [facility] Patient Information 2015 [place], [place]. This information is not intended to replace advice given to you by your health care provider. Make sure you discuss any questions you have with your health care provider.  Pelvic Rest Pelvic rest is sometimes recommended for women when:   The placenta is partially or completely covering the opening of the cervix (placenta previa).  There is bleeding between the uterine wall and the amniotic sac in the first trimester (subchorionic hemorrhage).  The cervix begins to open without labor starting (incompetent cervix, cervical insufficiency).  The labor is too early (preterm labor). HOME CARE INSTRUCTIONS  Do not have sexual intercourse, stimulation, or an orgasm.  Do not use tampons, douche, or put anything in the vagina.  Do not lift anything over 10 pounds (4.5 kg).  Avoid strenuous activity or straining your pelvic muscles. SEEK MEDICAL CARE IF:  You have any vaginal bleeding during pregnancy. Treat this as a potential emergency.  You have cramping pain felt low in the stomach (stronger than menstrual cramps).  You notice vaginal discharge (watery, mucus, or bloody).  You have a low, dull backache.  There are regular contractions or uterine tightening. SEEK  IMMEDIATE MEDICAL CARE IF: You have vaginal bleeding and have placenta previa.  Document Released: 08/20/2010 Document Revised: 07/18/2011 Document Reviewed: 08/20/2010 [facility] Patient Information 2015 [place]. This information is not intended to replace advice given to you by your  health care provider. Make sure you discuss any questions you have with your health care provider.  Cervical Insufficiency Cervical insufficiency is when the cervix is weak and starts to open (dilate) and thin (efface) before the pregnancy is at term and without labor starting. This is also called incompetent cervix. It can happen in the second or third trimester when the fetus starts putting pressure on the cervix. Cervical insufficiency can lead to a miscarriage, preterm premature rupture of the membranes (PPROM), or having the baby early (preterm birth).  RISK FACTORS You may be more likely to develop cervical insufficiency if:  You have a shorter cervix than normal.  Damage or injury occurred to your cervix from a past pregnancy or surgery.  You were born with a cervical defect.  You have had a procedure done on the cervix, such as cervical biopsy.  You have a history of cervical insufficiency.  You have a history of PPROM.  You have ended several past pregnancies through abortion.  You were exposed to the drug diethylstilbestrol (DES). SYMPTOMS Often times, women do not have any symptoms. Other times, women may only have mild symptoms that often start between week 14 through 20. The symptoms may last several days or weeks. These symptoms include:  Light spotting or bleeding from the vagina.  Pelvic pressure.  A change in vaginal discharge, such as discharge that changes from clear, white, or light yellow to pink or tan.  Back pain.  Abdominal pain or cramping. DIAGNOSIS Cervical insufficiency cannot be diagnosed before you become pregnant. Once you are pregnant, your health care provider will ask about your medical history and if you have had any problems in past pregnancies. Tell your health care provider about any procedures performed on your cervix or if you have a history of miscarriages or cervical insufficiency. If your health care provider thinks you are at high risk  for cervical insufficiency or show signs of cervical insufficiency, he or she may:  Perform a pelvic exam. This will check for:  The presence of the membranes (amniotic sac) coming out of the cervix.  Cervical abnormalities.  Cervical injuries.  The presence of contractions.  Perform an ultrasonography (commonly called ultrasound) to measure the length and thickness of the cervix. TREATMENT If you have been diagnosed with cervical insufficiency, your health care provider may recommend:  Limiting physical activity.  Bed rest at home or in the hospital.  Pelvic rest, which means no sexual intercourse or placing anything in the vagina.  Cerclage to sew the cervix closed and prevent it from opening too early. The stitches (sutures) are removed between weeks 36 and 38 to avoid problems during labor. Cerclage may be recommended during pregnancy if you have had a history of miscarriages or preterm births without a known cause. It may also be recommended if you have a short cervix that was identified by ultrasound or if your health care provider has found that your cervix has dilated before 24 weeks of pregnancy. Limiting physical activity and bed rest may or may not help prevent a preterm birth. WHEN SHOULD YOU SEEK IMMEDIATE MEDICAL CARE?  Seek immediate medical care if you show any symptoms of cervical insufficiency. You will need to go to the hospital to get  checked immediately. Document Released: 04/25/2005 Document Revised: 09/09/2013 Document Reviewed: 07/02/2012 [facility] Patient Information 2015 [place], [place]. This information is not intended to replace advice given to you by your health care provider. Make sure you discuss any questions you have with your health care provider.

## 2014-06-30 NOTE — Progress Notes (Signed)
Patient ID: Brooke [name], female   DOB: December 22, 1985, 29 y.o.   MRN: [id number] HD #5 POD#4 Cervical Insufficiency 22 wks 3d  S: No complaints No bleeding. No LOF Good FM. No CP or SOB or HA Occ vaginal pain, no pressure  O: BP 121/72 mmHg  Pulse 84  Temp(Src) 98.5 F (36.9 C) (Oral)  Resp 18  Ht 5\' 5"  (1.651 m)  Wt 103.874 kg (229 lb)  BMI 38.11 kg/m2  SpO2 100%  LMP 12/12/2013 (LMP Unknown)  Tmax 98.5  WDWN WF NAD Neck: supple with FROM Lungs: CTA CV: RRR Abd: Gravid, NT No CVAT Pelvic deferred Neuro: non focal Skin: intact  FHTs 120-140 UCS neg GBS pending Wet prep neg  A: 22 wk 3d IUP Cervical Insufficiency s/p rescue cerclage- CL 2.9cm today with no funneling Remote history of Epilepsy- no meds ? CHTN - stable BP History of abnl 1gtt with nl 3gtt No s/s infection  P: DC home Vaginal prometrium qhs Fu office one week OOW

## 2014-07-10 ENCOUNTER — Inpatient Hospital Stay (HOSPITAL_COMMUNITY)
Admit: 2014-07-10 | Discharge: 2014-09-24 | DRG: 774 | Disposition: A | Discharge status: Home / Self Care | Payer: 59 | Source: Ambulatory Visit | Attending: Nurse Practitioner | Admitting: Nurse Practitioner

## 2014-07-10 ENCOUNTER — Encounter (HOSPITAL_COMMUNITY): Payer: Self-pay | Admitting: *Deleted

## 2014-07-10 ENCOUNTER — Inpatient Hospital Stay (HOSPITAL_COMMUNITY): Payer: 59

## 2014-07-10 DIAGNOSIS — O4290 Premature rupture of membranes, unspecified as to length of time between rupture and onset of labor, unspecified weeks of gestation: Secondary | ICD-10-CM | POA: Insufficient documentation

## 2014-07-10 DIAGNOSIS — Z3A27 27 weeks gestation of pregnancy: Secondary | ICD-10-CM | POA: Insufficient documentation

## 2014-07-10 DIAGNOSIS — Z3A34 34 weeks gestation of pregnancy: Secondary | ICD-10-CM | POA: Diagnosis present

## 2014-07-10 DIAGNOSIS — O99210 Obesity complicating pregnancy, unspecified trimester: Secondary | ICD-10-CM | POA: Insufficient documentation

## 2014-07-10 DIAGNOSIS — O99352 Diseases of the nervous system complicating pregnancy, second trimester: Secondary | ICD-10-CM | POA: Diagnosis present

## 2014-07-10 DIAGNOSIS — O3432 Maternal care for cervical incompetence, second trimester: Secondary | ICD-10-CM | POA: Diagnosis present

## 2014-07-10 DIAGNOSIS — Z3A25 25 weeks gestation of pregnancy: Secondary | ICD-10-CM | POA: Insufficient documentation

## 2014-07-10 DIAGNOSIS — Z6839 Body mass index (BMI) 39.0-39.9, adult: Secondary | ICD-10-CM | POA: Diagnosis not present

## 2014-07-10 DIAGNOSIS — Z3A23 23 weeks gestation of pregnancy: Secondary | ICD-10-CM | POA: Insufficient documentation

## 2014-07-10 DIAGNOSIS — O26899 Other specified pregnancy related conditions, unspecified trimester: Secondary | ICD-10-CM | POA: Insufficient documentation

## 2014-07-10 DIAGNOSIS — Z3A26 26 weeks gestation of pregnancy: Secondary | ICD-10-CM | POA: Insufficient documentation

## 2014-07-10 DIAGNOSIS — O3430 Maternal care for cervical incompetence, unspecified trimester: Secondary | ICD-10-CM | POA: Insufficient documentation

## 2014-07-10 DIAGNOSIS — Z3A29 29 weeks gestation of pregnancy: Secondary | ICD-10-CM | POA: Insufficient documentation

## 2014-07-10 DIAGNOSIS — J069 Acute upper respiratory infection, unspecified: Secondary | ICD-10-CM | POA: Diagnosis present

## 2014-07-10 DIAGNOSIS — O26879 Cervical shortening, unspecified trimester: Secondary | ICD-10-CM

## 2014-07-10 DIAGNOSIS — G40909 Epilepsy, unspecified, not intractable, without status epilepticus: Secondary | ICD-10-CM | POA: Diagnosis present

## 2014-07-10 DIAGNOSIS — O42913 Preterm premature rupture of membranes, unspecified as to length of time between rupture and onset of labor, third trimester: Secondary | ICD-10-CM | POA: Diagnosis present

## 2014-07-10 DIAGNOSIS — O1092 Unspecified pre-existing hypertension complicating childbirth: Secondary | ICD-10-CM | POA: Diagnosis present

## 2014-07-10 DIAGNOSIS — K219 Gastro-esophageal reflux disease without esophagitis: Secondary | ICD-10-CM | POA: Diagnosis present

## 2014-07-10 DIAGNOSIS — O9962 Diseases of the digestive system complicating childbirth: Secondary | ICD-10-CM | POA: Diagnosis present

## 2014-07-10 DIAGNOSIS — O99214 Obesity complicating childbirth: Secondary | ICD-10-CM | POA: Diagnosis present

## 2014-07-10 DIAGNOSIS — O9952 Diseases of the respiratory system complicating childbirth: Secondary | ICD-10-CM | POA: Diagnosis present

## 2014-07-10 DIAGNOSIS — Z3A24 24 weeks gestation of pregnancy: Secondary | ICD-10-CM | POA: Insufficient documentation

## 2014-07-10 DIAGNOSIS — Z3689 Encounter for other specified antenatal screening: Secondary | ICD-10-CM | POA: Insufficient documentation

## 2014-07-10 DIAGNOSIS — Z3A28 28 weeks gestation of pregnancy: Secondary | ICD-10-CM | POA: Insufficient documentation

## 2014-07-10 DIAGNOSIS — R109 Unspecified abdominal pain: Secondary | ICD-10-CM

## 2014-07-10 DIAGNOSIS — N883 Incompetence of cervix uteri: Secondary | ICD-10-CM

## 2014-07-10 DIAGNOSIS — O288 Other abnormal findings on antenatal screening of mother: Secondary | ICD-10-CM | POA: Insufficient documentation

## 2014-07-10 HISTORY — DX: Polycystic ovarian syndrome: E28.2

## 2014-07-10 LAB — URINALYSIS, ROUTINE W REFLEX MICROSCOPIC
Bilirubin Urine: NEGATIVE
GLUCOSE, UA: 250 mg/dL — AB
Hgb urine dipstick: NEGATIVE
KETONES UR: NEGATIVE mg/dL
LEUKOCYTES UA: NEGATIVE
Nitrite: NEGATIVE
Protein, ur: NEGATIVE mg/dL
Specific Gravity, Urine: >1.03 — ABNORMAL HIGH (ref 1.005–1.030)
Urobilinogen, UA: 0.2 mg/dL (ref 0.0–1.0)
pH: 5.5 (ref 5.0–8.0)

## 2014-07-10 LAB — WET PREP, GENITAL
Clue Cells Wet Prep HPF POC: NONE SEEN
Trich, Wet Prep: NONE SEEN
Yeast Wet Prep HPF POC: NONE SEEN

## 2014-07-10 MED ORDER — PRENATAL MULTIVITAMIN CH
1.0000 | ORAL_TABLET | Freq: Every day | ORAL | Status: DC
  Administered 2014-07-11 – 2014-09-21 (×72): 1 via ORAL
  Filled 2014-07-10 (×71): qty 1

## 2014-07-10 MED ORDER — METFORMIN HCL 500 MG PO TABS
500.0000 mg | ORAL_TABLET | Freq: Every day | ORAL | Status: DC
  Administered 2014-07-11 – 2014-09-21 (×74): 500 mg via ORAL
  Filled 2014-07-10 (×76): qty 1

## 2014-07-10 MED ORDER — FAMOTIDINE 20 MG PO TABS
20.0000 mg | ORAL_TABLET | Freq: Two times a day (BID) | ORAL | Status: DC
  Administered 2014-07-11 – 2014-08-29 (×98): 20 mg via ORAL
  Filled 2014-07-10 (×99): qty 1

## 2014-07-10 MED ORDER — ZOLPIDEM TARTRATE 5 MG PO TABS
5.0000 mg | ORAL_TABLET | Freq: Every evening | ORAL | Status: DC | PRN
  Administered 2014-08-14 – 2014-09-21 (×27): 5 mg via ORAL
  Filled 2014-07-10 (×28): qty 1

## 2014-07-10 MED ORDER — DOCUSATE SODIUM 100 MG PO CAPS
100.0000 mg | ORAL_CAPSULE | Freq: Every day | ORAL | Status: DC
Start: 2014-07-11 — End: 2014-09-22
  Administered 2014-07-11 – 2014-09-20 (×72): 100 mg via ORAL
  Filled 2014-07-10 (×73): qty 1

## 2014-07-10 MED ORDER — CALCIUM CARBONATE ANTACID 500 MG PO CHEW
2.0000 | CHEWABLE_TABLET | ORAL | Status: DC | PRN
  Administered 2014-08-29: 400 mg via ORAL
  Filled 2014-07-10: qty 1

## 2014-07-10 MED ORDER — ACETAMINOPHEN 325 MG PO TABS
650.0000 mg | ORAL_TABLET | ORAL | Status: DC | PRN
  Administered 2014-07-11 – 2014-09-21 (×17): 650 mg via ORAL
  Filled 2014-07-10 (×18): qty 2

## 2014-07-10 MED ORDER — PROGESTERONE MICRONIZED 200 MG PO CAPS
200.0000 mg | ORAL_CAPSULE | Freq: Every day | ORAL | Status: DC
  Administered 2014-07-11 – 2014-08-20 (×42): 200 mg via VAGINAL
  Filled 2014-07-10 (×42): qty 1

## 2014-07-10 NOTE — MAU Note (Signed)
Pt reports she had a cerclage placed 2 weeks ago and today has been having lower abd pressure and pain. Also reports a little bit of watery fluid leaking about 1 hour ago , denies bleeding

## 2014-07-10 NOTE — MAU Provider Note (Signed)
History     CSN: [id number]  Arrival date and time: 07/10/14 1932   None    HPI 29 yo, G2P0, 23.6 wks with rescue cerclage, here for vaginal pressure, fluid like vaginal discharge bvut just once, patient has been out of the bed and out of the house for 1 hr. She was following modified bedrest but went out and was on her feet for 1 hr. Denies contractions/ bleeding. Feels fetal movements. Office sono yesterday noted short dynamic cervix (0.9 cm closed, 3.7 cm total length- dynamic cervix).  Denies intercourse since cerclage.  S/p Rescue cerclage on 06/26/2014 at 21.9 wks. Is on vaginal Prometrium at night.  PCOS. Failed 1hr Glucola in early preg, she is on Metformin 500 mg at bedtime GERD, Zantac bid.  Maternal obesity Epilepsy hx, no meds.    Past Medical History  Diagnosis Date  . Hypertension   . Epilepsy     Last seizure 3 weeks ago  . Polycystic disease, ovaries   . Migraines   . Anxiety     Past Surgical History  Procedure Laterality Date  . Tonsillectomy    . Cervical cerclage N/A 06/26/2014    Procedure: CERCLAGE CERVICAL;  Surgeon: Lenoard Aden, MD;  Location: [facility];  Service: Gynecology;  Laterality: N/A;    Family History  Problem Relation Age of Onset  . Diabetes Mother   . Hypertension Mother   . Hypertension Father   . Alcohol abuse Maternal Grandmother   . Hearing loss Paternal Grandfather     History  Substance Use Topics  . Smoking status: Never Smoker   . Smokeless tobacco: Never Used  . Alcohol Use: No    Allergies:  Allergies  Allergen Reactions  . Codeine Itching    Prescriptions prior to admission  Medication Sig Dispense Refill Last Dose  . acetaminophen (TYLENOL) 500 MG tablet Take 1,000 mg by mouth every 4 (four) hours as needed for mild pain or moderate pain.   07/09/2014 at Unknown time  . folic acid (FOLVITE) 400 MCG tablet Take 800 mcg by mouth at bedtime.    07/09/2014 at Unknown time  . metFORMIN (GLUCOPHAGE) 500 MG tablet Take  500 mg by mouth at bedtime.   07/09/2014 at Unknown time  . Prenat w/o A-FE-DSS-Methfol-FA (PRENATAL MULTIVITAMIN) 90-600-400 MG-MCG-MCG tablet Take 1 tablet by mouth at bedtime.    07/09/2014 at Unknown time  . progesterone (PROMETRIUM) 200 MG capsule Place 1 capsule (200 mg total) vaginally at bedtime. 30 capsule 3 07/09/2014 at Unknown time  . ranitidine (ZANTAC) 150 MG tablet Take 150 mg by mouth 2 (two) times daily as needed for heartburn.    07/09/2014 at Unknown time  . HYDROcodone-acetaminophen (NORCO/VICODIN) 5-325 MG per tablet Take 1 tablet by mouth every 4 (four) hours as needed for moderate pain. (Patient not taking: Reported on 07/10/2014) 12 tablet 0   . loratadine (CLARITIN) 10 MG tablet Take 10 mg by mouth daily as needed for allergies.   prn  . ondansetron (ZOFRAN ODT) 8 MG disintegrating tablet Take 1 tablet (8 mg total) by mouth every 8 (eight) hours as needed for nausea or vomiting. (Patient not taking: Reported on 07/10/2014) 10 tablet 0     ROS  No fever/ chills/ back pain Physical Exam   Blood pressure 151/90, pulse 101, temperature 98.7 F (37.1 C), temperature source Oral, resp. rate 18, height  (1.651 m), weight 230 lb (104.327 kg), last menstrual period 12/12/2013.  Physical Exam  A&O x  3, no acute distress. Pleasant HEENT neg Lungs CTA bilat CV RRR, S1S2 normal Abdo soft, non tender, non acute, relaxed soft uterus, no palpable contractions.  Extr no edema/ tenderness Pelvic - Spec exam- no fluid or blood. Slide- neg ferning. Wet prep negative. Stitch intact but very short cervix., no palpable cervix below the stitch.  FHTs 150s  MAU Course  Procedures Plan - Sono for AFI and vaginal sono for  CL.  Grossly slightly low amniotic fluid.  CL 0.5 cm with large wide U shaped funnel with sludge in amniotic fluid.  (office sono also noted a funnel but was a funnel and not U, closed part was 0.9 cm yesterday)  Assessment and Plan  29 yo G1 at 23.6 wks with cervical  incompetence and worsening funneling. Recommend admission, bedrest. Reassess in AM with Dr Billy Coastaavon who is aware.  Will discuss BTMZ and NICU consult in AM.  Continue vaginal Prometrium.

## 2014-07-11 DIAGNOSIS — O26899 Other specified pregnancy related conditions, unspecified trimester: Secondary | ICD-10-CM | POA: Insufficient documentation

## 2014-07-11 DIAGNOSIS — Z3A23 23 weeks gestation of pregnancy: Secondary | ICD-10-CM | POA: Insufficient documentation

## 2014-07-11 DIAGNOSIS — R109 Unspecified abdominal pain: Secondary | ICD-10-CM

## 2014-07-11 DIAGNOSIS — O4290 Premature rupture of membranes, unspecified as to length of time between rupture and onset of labor, unspecified weeks of gestation: Secondary | ICD-10-CM | POA: Insufficient documentation

## 2014-07-11 LAB — CBC WITH DIFFERENTIAL/PLATELET
Basophils Absolute: 0 10*3/uL (ref 0.0–0.1)
Basophils Relative: 0 % (ref 0–1)
Eosinophils Absolute: 0.2 10*3/uL (ref 0.0–0.7)
Eosinophils Relative: 2 % (ref 0–5)
HCT: 36.9 % (ref 36.0–46.0)
HEMOGLOBIN: 12.7 g/dL (ref 12.0–15.0)
Lymphocytes Relative: 26 % (ref 12–46)
Lymphs Abs: 2.7 10*3/uL (ref 0.7–4.0)
MCH: 31.4 pg (ref 26.0–34.0)
MCHC: 34.4 g/dL (ref 30.0–36.0)
MCV: 91.1 fL (ref 78.0–100.0)
MONOS PCT: 8 % (ref 3–12)
Monocytes Absolute: 0.8 10*3/uL (ref 0.1–1.0)
NEUTROS ABS: 6.7 10*3/uL (ref 1.7–7.7)
NEUTROS PCT: 64 % (ref 43–77)
Platelets: 238 10*3/uL (ref 150–400)
RBC: 4.05 MIL/uL (ref 3.87–5.11)
RDW: 12.7 % (ref 11.5–15.5)
WBC: 10.3 10*3/uL (ref 4.0–10.5)

## 2014-07-11 LAB — TYPE AND SCREEN
ABO/RH(D): B POS
Antibody Screen: NEGATIVE

## 2014-07-11 NOTE — Progress Notes (Signed)
Patient ID: [name], female   DOB: [birth year], 29 y.o.   MRN: [id number] HD #1 Cervical Insufficiency 24 wks 0d with Rescue cerclage  S: No complaints No bleeding. No LOF Good FM. No CP or SOB or HA Occ vaginal pain, no pressure  O: BP 135/74 mmHg  Pulse 85  Temp(Src) 98.5 F (36.9 C) (Oral)  Resp 18  Ht 5\' 5"  (1.651 m)  Wt 104.327 kg (230 lb)  BMI 38.27 kg/m2  LMP 12/12/2013 (LMP Unknown)  Tmax 98.5  WDWN WF NAD Neck: supple with FROM Lungs: CTA CV: RRR Abd: Gravid, NT No CVAT Pelvic deferred Neuro: non focal Skin: intact  FHTs 120-140 Wet prep neg  A: 24 wk 0d IUP Cervical Insufficiency s/p rescue cerclage Remote history of Epilepsy- no meds ? CHTN - stable BP History of abnl 1gtt with nl 3gtt No s/s infection  P: Vaginal prometrium qhs Bedrest with BRP Rpt CL and AFI 48-72hrs Will BMZ at this time

## 2014-07-12 NOTE — Discharge Summary (Signed)
[name], Brooke       ACCOUNT NO.:  [id number]  MEDICAL RECORD NO.:  [id number]  LOCATION:  9152                          FACILITY:  [facility]  PHYSICIAN:  Lenoard Adenichard J. Norva Bowe, M.D.DATE OF BIRTH:  1985/10/18  DATE OF ADMISSION:  06/26/2014 DATE OF DISCHARGE:  06/30/2014                              DISCHARGE SUMMARY   Admission dx: Cervical insufficiency Discharge dx: same Admitted and underwent emergency rescue cerclage. Postoperatively was afebrile and stable with no bleeding. Was discharged to home on postop day #4 after the cervical length was within normal limits.  The patient was stable.  She was afebrile.  Discharge medications were PNV.  She is discharged to home on Bedrest and BRP. Follow up in the office within 1 week.     Lenoard Adenichard J. Elden Brucato, M.D.     RJT/MEDQ  D:  07/12/2014  T:  07/12/2014  Job:  [id number]

## 2014-07-12 NOTE — Progress Notes (Signed)
Patient ID: Brooke [name], female   DOB: Jul 12, 1985, 29 y.o.   MRN: [id number] HD #2 Cervical Insufficiency 24 wks 1d with Rescue cerclage  S: No complaints No bleeding. No LOF Good FM. No CP or SOB or HA Occ vaginal pain, no pressure  O: BP 120/77 mmHg  Pulse 86  Temp(Src) 98.5 F (36.9 C) (Oral)  Resp 20  Ht 5\' 5"  (1.651 m)  Wt 104.327 kg (230 lb)  BMI 38.27 kg/m2  LMP 12/12/2013 (LMP Unknown)  Tmax 98.7  WDWN WF NAD Neck: supple with FROM Lungs: CTA CV: RRR Abd: Gravid, NT No CVAT Pelvic deferred Neuro: non focal Skin: intact  FHTs 120-140 Wet prep neg CBC    Component Value Date/Time   WBC 10.3 07/11/2014 0022   RBC 4.05 07/11/2014 0022   HGB 12.7 07/11/2014 0022   HCT 36.9 07/11/2014 0022   PLT 238 07/11/2014 0022   MCV 91.1 07/11/2014 0022   MCH 31.4 07/11/2014 0022   MCHC 34.4 07/11/2014 0022   RDW 12.7 07/11/2014 0022   LYMPHSABS 2.7 07/11/2014 0022   MONOABS 0.8 07/11/2014 0022   EOSABS 0.2 07/11/2014 0022   BASOSABS 0.0 07/11/2014 0022      A: 24 wk 1d IUP Cervical Insufficiency s/p rescue cerclage Remote history of Epilepsy- no meds ? CHTN - stable BP History of abnl 1gtt with nl 3gtt No s/s infection  P: Vaginal prometrium qhs Bedrest with BRP Rpt CL and AFI 3/7 with MFM Will defer BMZ at this time NST q shift

## 2014-07-13 NOTE — Progress Notes (Signed)
Patient ID: Brooke [name], female   DOB: Sep 05, 1985, 29 y.o.   MRN: [id number] HD #3 Cervical Insufficiency 24 wks 2d with Rescue cerclage  S: No complaints No bleeding. No LOF Good FM. No CP or SOB or HA Occ vaginal pain, no pressure  O: BP 137/81 mmHg  Pulse 92  Temp(Src) 98.7 F (37.1 C) (Oral)  Resp 20  Ht 5\' 5"  (1.651 m)  Wt 104.327 kg (230 lb)  BMI 38.27 kg/m2  LMP 12/12/2013 (LMP Unknown)  Tmax 98.7  WDWN WF NAD Neck: supple with FROM Lungs: CTA CV: RRR Abd: Gravid, NT No CVAT Pelvic deferred Neuro: non focal Skin: intact  FHTs 120-140 Reassuring FHR with accels noted, BTBV 6-25 Reassuring NST x 3 No contractions noted Wet prep neg  CBC    Component Value Date/Time   WBC 10.3 07/11/2014 0022   RBC 4.05 07/11/2014 0022   HGB 12.7 07/11/2014 0022   HCT 36.9 07/11/2014 0022   PLT 238 07/11/2014 0022   MCV 91.1 07/11/2014 0022   MCH 31.4 07/11/2014 0022   MCHC 34.4 07/11/2014 0022   RDW 12.7 07/11/2014 0022   LYMPHSABS 2.7 07/11/2014 0022   MONOABS 0.8 07/11/2014 0022   EOSABS 0.2 07/11/2014 0022   BASOSABS 0.0 07/11/2014 0022      A: 24 wk 2d IUP Cervical Insufficiency s/p rescue cerclage Remote history of Epilepsy- no meds ? CHTN - stable BP History of abnl 1gtt with nl 3gtt No s/s infection  P: Vaginal prometrium qhs Bedrest with BRP Rpt CL and AFI 3/7 with MFM Will defer BMZ at this time NST q shift

## 2014-07-14 LAB — CBC WITH DIFFERENTIAL/PLATELET
BASOS ABS: 0 10*3/uL (ref 0.0–0.1)
BASOS PCT: 0 % (ref 0–1)
EOS ABS: 0.2 10*3/uL (ref 0.0–0.7)
EOS PCT: 2 % (ref 0–5)
HEMATOCRIT: 37 % (ref 36.0–46.0)
Hemoglobin: 12.8 g/dL (ref 12.0–15.0)
Lymphocytes Relative: 22 % (ref 12–46)
Lymphs Abs: 2.2 10*3/uL (ref 0.7–4.0)
MCH: 31.5 pg (ref 26.0–34.0)
MCHC: 34.6 g/dL (ref 30.0–36.0)
MCV: 91.1 fL (ref 78.0–100.0)
MONO ABS: 1 10*3/uL (ref 0.1–1.0)
Monocytes Relative: 10 % (ref 3–12)
Neutro Abs: 6.7 10*3/uL (ref 1.7–7.7)
Neutrophils Relative %: 66 % (ref 43–77)
Platelets: 264 10*3/uL (ref 150–400)
RBC: 4.06 MIL/uL (ref 3.87–5.11)
RDW: 12.9 % (ref 11.5–15.5)
WBC: 10.1 10*3/uL (ref 4.0–10.5)

## 2014-07-14 LAB — TYPE AND SCREEN
ABO/RH(D): B POS
ANTIBODY SCREEN: NEGATIVE

## 2014-07-14 MED ORDER — POLYETHYLENE GLYCOL 3350 17 G PO PACK
17.0000 g | PACK | Freq: Every day | ORAL | Status: DC
  Administered 2014-07-14 – 2014-09-20 (×48): 17 g via ORAL
  Filled 2014-07-14 (×54): qty 1

## 2014-07-14 NOTE — Progress Notes (Signed)
Patient ID: Brooke [name], female   DOB: 1986-03-17, 29 y.o.   MRN: [id number] HD #4 Cervical Insufficiency 24 wks 3d with Rescue cerclage  S: No complaints No bleeding. No LOF Good FM. No CP or SOB or HA Occ vaginal pain, no pressure  O: BP 128/70 mmHg  Pulse 90  Temp(Src) 98.1 F (36.7 C) (Oral)  Resp 18  Ht 5\' 5"  (1.651 m)  Wt 104.327 kg (230 lb)  BMI 38.27 kg/m2  LMP 12/12/2013 (LMP Unknown)  Tmax 98.9  WDWN WF NAD Neck: supple with FROM Lungs: CTA CV: RRR Abd: Gravid, NT No CVAT Pelvic deferred Neuro: non focal Skin: intact  FHTs 120-140 Reassuring FHR with accels noted, BTBV 6-25 Reassuring NST x 3 No contractions noted Wet prep neg  CBC    Component Value Date/Time   WBC 10.3 07/11/2014 0022   RBC 4.05 07/11/2014 0022   HGB 12.7 07/11/2014 0022   HCT 36.9 07/11/2014 0022   PLT 238 07/11/2014 0022   MCV 91.1 07/11/2014 0022   MCH 31.4 07/11/2014 0022   MCHC 34.4 07/11/2014 0022   RDW 12.7 07/11/2014 0022   LYMPHSABS 2.7 07/11/2014 0022   MONOABS 0.8 07/11/2014 0022   EOSABS 0.2 07/11/2014 0022   BASOSABS 0.0 07/11/2014 0022      A: 24 wk 3d IUP Cervical Insufficiency s/p rescue cerclage Remote history of Epilepsy- no meds ? CHTN - stable BP History of abnl 1gtt with nl 3gtt No s/s infection  P: Vaginal prometrium qhs Bedrest with BRP Rpt CL and AFI 3/8(deferred by MFM office) with MFM Will defer BMZ at this time Continue NST q shift

## 2014-07-14 NOTE — Progress Notes (Signed)
CTSP for increased pelvic pressure.  Pt notes increased in pelvic pressure after straining for BM. No VB, no LOF, no ctx. No fevers, no abdominal pain.  O: Toco: neg FH: 140s, + 10x10 accels, 10 beat variability Abd: gravid, NT Gen: well appearing, no distress LE: NT, no edema SSE: cerclage intact, external cvx closed, no membranes seen, no ROM  Filed Vitals:   07/14/14 0603 07/14/14 0917 07/14/14 1200 07/14/14 1649  BP: 134/80 128/70 113/63 130/67  Pulse: 97 90 89 94  Temp: 98.2 F (36.8 C) 98.1 F (36.7 C) 97.9 F (36.6 C) 98.6 F (37 C)  TempSrc:  Oral Oral Oral  Resp: 18 18 18 18   Height:      Weight:       A/P: Cervical incompetence s/p rescue cerclage with continued U shaped funneling and sludge in lower cvx. High risk for infection, ROM and PTD.  No new findings to change current plan. If membranes in vagina or ROM would plan BMZ and latency antibiotics, Mag too. If contracting could consider tocolysis after r/o infection. Continue bed rest, check WBC, improve bowel regimen.   Cleatis Fandrich A. 07/14/2014 9:27 PM

## 2014-07-15 ENCOUNTER — Inpatient Hospital Stay (HOSPITAL_COMMUNITY): Payer: 59

## 2014-07-15 DIAGNOSIS — O3432 Maternal care for cervical incompetence, second trimester: Secondary | ICD-10-CM | POA: Insufficient documentation

## 2014-07-15 DIAGNOSIS — Z3A24 24 weeks gestation of pregnancy: Secondary | ICD-10-CM | POA: Insufficient documentation

## 2014-07-15 MED ORDER — BETAMETHASONE SOD PHOS & ACET 6 (3-3) MG/ML IJ SUSP
12.0000 mg | Freq: Every day | INTRAMUSCULAR | Status: AC
  Administered 2014-07-15 – 2014-07-16 (×2): 12 mg via INTRAMUSCULAR
  Filled 2014-07-15 (×2): qty 2

## 2014-07-15 NOTE — Consult Note (Addendum)
[facility]    10:43 AM  Neonatal Medicine Consultation         [name]          MRN:  [id number]  I was called at the request of the patient's obstetrician (Dr. Billy Coastaavon) to speak to this patient due to impending premature delivery due to cervical incompetence.  The patient's prenatal course includes admission on 06/26/14 at 22 weeks for cervical insufficiency.  She had a cerclage placed and was discharged on 06/30/14.  She got readmitted on 07/10/14 for worsening funneling. Until recently she has been kept a bedrest under close observation, but currently has essentially minimal cervical thickness, with the cerclage remaining intact.  Decision made today by her OB to proceed with betamethasone course.  She is 24 4/7 weeks currently.  Her membranes are still intact, and she has no sign of infection (and is not currently on antibiotics).  Her GBS test on 06/26/14 was negative.  The baby is female.   I spoke with both mom and dad, and reviewed expectations for a baby born at 3424 or 25 weeks, including survival, length of stay, morbidities such as respiratory distress, IVH, infection, feeding intolerance, retinopathy.  I described how we provide respiratory and feeding support.  Mom plans to breast feed, which I encouraged as best for the baby (with supplementations for needed calories).  I let mom know that the baby's outlook generally improves the longer she remains undelivered.  I spent 30 minutes reviewing the record, speaking to the patient, and entering appropriate documentation.  More than 50% of the time was spent face to face with patient.   _____________________ Electronically Signed By: Angelita InglesMcCrae S. Mycheal Veldhuizen, MD Neonatologist

## 2014-07-15 NOTE — Progress Notes (Signed)
Patient called out complaining of feeling pressure and movement in vagina. Monitors applied, MD notified

## 2014-07-15 NOTE — Progress Notes (Signed)
Patient ID: Brooke [name], female   DOB: Apr 22, 1986, 29 y.o.   MRN: [id number] HD #5 Cervical Insufficiency 24 wks 4d with Rescue cerclage  S: No complaints No bleeding. No LOF Good FM. No CP or SOB or HA Occ vaginal pain, no pressure Pressure last night resolved. Neg spec exam noted.  O: BP 151/86 mmHg  Pulse 100  Temp(Src) 98.7 F (37.1 C) (Oral)  Resp 18  Ht 5\' 5"  (1.651 m)  Wt 104.327 kg (230 lb)  BMI 38.27 kg/m2  LMP 12/12/2013 (LMP Unknown)  Tmax 98.9  WDWN WF NAD Neck: supple with FROM Lungs: CTA CV: RRR Abd: Gravid, NT No CVAT Pelvic deferred Neuro: non focal Skin: intact  FHTs 120-140 Reassuring FHR with accels noted, BTBV 6-25 Reassuring NST x 3 No contractions noted Wet prep neg  CBC    Component Value Date/Time   WBC 10.1 07/14/2014 2135   RBC 4.06 07/14/2014 2135   HGB 12.8 07/14/2014 2135   HCT 37.0 07/14/2014 2135   PLT 264 07/14/2014 2135   MCV 91.1 07/14/2014 2135   MCH 31.5 07/14/2014 2135   MCHC 34.6 07/14/2014 2135   RDW 12.9 07/14/2014 2135   LYMPHSABS 2.2 07/14/2014 2135   MONOABS 1.0 07/14/2014 2135   EOSABS 0.2 07/14/2014 2135   BASOSABS 0.0 07/14/2014 2135      A: 24 wk 4d IUP Cervical Insufficiency s/p rescue cerclage Remote history of Epilepsy- no meds ? CHTN - stable BP History of abnl 1gtt with nl 3gtt No s/s infection  P: Vaginal prometrium qhs Bedrest with BRP Rpt CL and aFI stable since admission but CL functionally less than 1cm Will proceed with BMZ at this time NICU consult Continue NST q shift

## 2014-07-16 NOTE — Progress Notes (Signed)
Patient ID: Brooke [name], female   DOB: 06-23-1985, 29 y.o.   MRN: [id number] HD #6 Cervical Insufficiency 24 wks 5d with Rescue cerclage  S: No complaints No bleeding. No LOF Good FM. No CP or SOB or HA Occ vaginal pain, no pressure Pressure resolved.  O: BP 117/76 mmHg  Pulse 93  Temp(Src) 98.6 F (37 C) (Oral)  Resp 20  Ht 5\' 5"  (1.651 m)  Wt 104.327 kg (230 lb)  BMI 38.27 kg/m2  LMP 12/12/2013 (LMP Unknown)  Tmax 98.6  WDWN WF NAD Neck: supple with FROM Lungs: CTA CV: RRR Abd: Gravid, NT No CVAT Pelvic closed/90/OOP/cerclage intact Neuro: non focal Skin: intact  FHTs 120-140 Reassuring FHR with accels noted, BTBV 6-25 Reassuring NST x 3 No contractions noted Wet prep neg  CBC    Component Value Date/Time   WBC 10.1 07/14/2014 2135   RBC 4.06 07/14/2014 2135   HGB 12.8 07/14/2014 2135   HCT 37.0 07/14/2014 2135   PLT 264 07/14/2014 2135   MCV 91.1 07/14/2014 2135   MCH 31.5 07/14/2014 2135   MCHC 34.6 07/14/2014 2135   RDW 12.9 07/14/2014 2135   LYMPHSABS 2.2 07/14/2014 2135   MONOABS 1.0 07/14/2014 2135   EOSABS 0.2 07/14/2014 2135   BASOSABS 0.0 07/14/2014 2135      A: 24 wk 5d IUP Cervical Insufficiency s/p rescue cerclage Remote history of Epilepsy- no meds ? CHTN - stable BP History of abnl 1gtt with nl 3gtt No s/s infection  P: Vaginal prometrium qhs Bedrest with BRP Rpt CL and aFI stable since admission but CL functionally less than 1cm Will proceed with BMZ #2 today NICU consult done Continue NST q shift

## 2014-07-17 LAB — TYPE AND SCREEN
ABO/RH(D): B POS
Antibody Screen: NEGATIVE

## 2014-07-17 NOTE — Progress Notes (Signed)
Antenatal Nutrition Assessment:  Currently  24 6/[redacted] weeks gestation, with cervical insuff, cerclage. Height  65 "  Weight 227 lbs  pre-pregnancy weight 219 lbs .  Pre-pregnancy  BMI 36.5  IBW 125 lbs Total weight gain 8.lbs Weight gain goals 11-22 lbs Estimated needs: 2200-2400 kcal/day, 94-104 grams protein/day, 2.5 liters fluid/day  Regular diet tolerated well, appetite good. Current diet prescription will provide for increased needs.Encouraged increased protein consumption over carb intake if hungry May order double protein portions, snacks TID and from retail  No abnormal nutrition related labs  Nutrition Dx: Increased nutrient needs r/t pregnancy and fetal growth requirements aeb [redacted] weeks gestation.  No educational needs assessed at this time.  Elisabeth CaraKatherine Kadesha Virrueta M.Odis LusterEd. R.D. LDN Neonatal Nutrition Support Specialist/RD III Pager [phone]

## 2014-07-17 NOTE — Progress Notes (Signed)
Patient ID: Brooke [name], female   DOB: 1985/08/27, 29 y.o.   MRN: [id number] HD #6 Cervical Insufficiency 24 wks 6d with Rescue cerclage  S: No complaints No bleeding. No LOF Good FM. No CP or SOB or HA Occ vaginal pain, no pressure Pressure resolved.  O: BP 113/73 mmHg  Pulse 87  Temp(Src) 98.5 F (36.9 C) (Oral)  Resp 20  Ht 5\' 5"  (1.651 m)  Wt 103.012 kg (227 lb 1.6 oz)  BMI 37.79 kg/m2  LMP 12/12/2013 (LMP Unknown)  Tmax 98.6  WDWN WF NAD Neck: supple with FROM Lungs: CTA CV: RRR Abd: Gravid, NT No CVAT Pelvic closed/90/OOP/cerclage intact Neuro: non focal Skin: intact  FHTs 120-140 Reassuring FHR with accels noted, BTBV 6-25. occ mild variable Reassuring NST x 3 No contractions noted Wet prep neg  CBC    Component Value Date/Time   WBC 10.1 07/14/2014 2135   RBC 4.06 07/14/2014 2135   HGB 12.8 07/14/2014 2135   HCT 37.0 07/14/2014 2135   PLT 264 07/14/2014 2135   MCV 91.1 07/14/2014 2135   MCH 31.5 07/14/2014 2135   MCHC 34.6 07/14/2014 2135   RDW 12.9 07/14/2014 2135   LYMPHSABS 2.2 07/14/2014 2135   MONOABS 1.0 07/14/2014 2135   EOSABS 0.2 07/14/2014 2135   BASOSABS 0.0 07/14/2014 2135      A: 24 wk 6d IUP Cervical Insufficiency s/p rescue cerclage Remote history of Epilepsy- no meds ? CHTN - stable BP History of abnl 1gtt with nl 3gtt No s/s infection  P: Vaginal prometrium qhs Bedrest with BRP Rpt CL and aFI stable since admission but CL functionally less than 1cm BMZ completed NICU consult done Continue NST q shift GBS today Rpt sono next wk for CL, AFI, growth

## 2014-07-17 NOTE — Progress Notes (Signed)
Ur chart review completed.  

## 2014-07-18 NOTE — Progress Notes (Signed)
Patient ID: Brooke [name], female   DOB: 11-28-1985, 29 y.o.   MRN: [id number] HD #7 Cervical Insufficiency 25 wks 0d with Rescue cerclage  S: No complaints No bleeding. No LOF Good FM. No CP or SOB or HA Occ vaginal pain, no pressure  O: BP 114/60 mmHg  Pulse 79  Temp(Src) 99 F (37.2 C) (Oral)  Resp 20  Ht 5\' 5"  (1.651 m)  Wt 103.012 kg (227 lb 1.6 oz)  BMI 37.79 kg/m2  LMP 12/12/2013 (LMP Unknown)  Tmax 98.8  WDWN WF NAD Neck: supple with FROM Lungs: CTA CV: RRR Abd: Gravid, NT No CVAT Pelvic: deferred Neuro: non focal Skin: intact  FHTs 120-140 Reassuring FHR with accels noted, BTBV 6-25. occ mild variable Reassuring NST x 3 No contractions noted Wet prep neg  CBC    Component Value Date/Time   WBC 10.1 07/14/2014 2135   RBC 4.06 07/14/2014 2135   HGB 12.8 07/14/2014 2135   HCT 37.0 07/14/2014 2135   PLT 264 07/14/2014 2135   MCV 91.1 07/14/2014 2135   MCH 31.5 07/14/2014 2135   MCHC 34.6 07/14/2014 2135   RDW 12.9 07/14/2014 2135   LYMPHSABS 2.2 07/14/2014 2135   MONOABS 1.0 07/14/2014 2135   EOSABS 0.2 07/14/2014 2135   BASOSABS 0.0 07/14/2014 2135      A: 25 wk 0d IUP Cervical Insufficiency s/p rescue cerclage Remote history of Epilepsy- no meds ? CHTN - stable BP History of abnl 1gtt with nl 3gtt No s/s infection  P: Vaginal prometrium qhs Bedrest with BRP Rpt CL since admission but CL functionally less than 1cm,   AFI low nl but stable, no clinical evidence of SROM BMZ completed NICU consult done Continue NST q shift GBS done and pending Rpt sono next wk for CL, AFI, growth

## 2014-07-19 LAB — CULTURE, BETA STREP (GROUP B ONLY)

## 2014-07-19 NOTE — Progress Notes (Signed)
Patient ID: Brooke [name], female   DOB: 1985/12/26, 29 y.o.   MRN: [id number] HD #8 Cervical Insufficiency 25 wks 1d with Rescue cerclage  S: No complaints No bleeding. No LOF Good FM. No CP or SOB or HA Occ vaginal pain, no pressure  O: BP 145/80 mmHg  Pulse 101  Temp(Src) 98.5 F (36.9 C) (Oral)  Resp 18  Ht 5\' 5"  (1.651 m)  Wt 103.012 kg (227 lb 1.6 oz)  BMI 37.79 kg/m2  LMP 12/12/2013 (LMP Unknown)  Tmax 99  WDWN WF NAD Neck: supple with FROM Lungs: CTA CV: RRR Abd: Gravid, NT No CVAT Pelvic: deferred Neuro: non focal Skin: intact  FHTs 120-150 Reassuring FHR with accels noted, BTBV 6-25. occ mild variable Reassuring NST x 3 No contractions noted Wet prep neg  CBC    Component Value Date/Time   WBC 10.1 07/14/2014 2135   RBC 4.06 07/14/2014 2135   HGB 12.8 07/14/2014 2135   HCT 37.0 07/14/2014 2135   PLT 264 07/14/2014 2135   MCV 91.1 07/14/2014 2135   MCH 31.5 07/14/2014 2135   MCHC 34.6 07/14/2014 2135   RDW 12.9 07/14/2014 2135   LYMPHSABS 2.2 07/14/2014 2135   MONOABS 1.0 07/14/2014 2135   EOSABS 0.2 07/14/2014 2135   BASOSABS 0.0 07/14/2014 2135      A: 25 wk 1d IUP Cervical Insufficiency s/p rescue cerclage Remote history of Epilepsy- no meds ? CHTN - stable BP but trending higher Low Nl AFI History of abnl 1gtt with nl 3gtt No s/s infection  P: Vaginal prometrium qhs Bedrest with BRP Rpt CL since admission but CL functionally less than 1cm BMZ completed NICU consult done Continue NST q shift GBS done and pending Rpt sono Tuesday for CL, AFI, growth Discuss pessary with MFM

## 2014-07-20 LAB — TYPE AND SCREEN
ABO/RH(D): B POS
Antibody Screen: NEGATIVE

## 2014-07-20 NOTE — Progress Notes (Signed)
Patient ID: Brooke [name], female   DOB: Mar 10, 1986, 29 y.o.   MRN: [id number] HD #9 Cervical Insufficiency 25 wks 2d with Rescue cerclage  S: No complaints No bleeding. No LOF Good FM. No CP or SOB or HA Occ vaginal pain, no pressure  O: BP 116/79 mmHg  Pulse 91  Temp(Src) 98.3 F (36.8 C) (Oral)  Resp 16  Ht 5\' 5"  (1.651 m)  Wt 103.012 kg (227 lb 1.6 oz)  BMI 37.79 kg/m2  LMP 12/12/2013 (LMP Unknown)  Tmax 98.7  WDWN WF NAD Neck: supple with FROM Lungs: CTA, No WRR CV: RRR, No MRG Abd: Gravid, NT, Fundus appropriate with dates No CVAT Pelvic: deferred Neuro: non focal Skin: intact  FHTs 120-150 Reassuring FHR with accels noted, BTBV 6-25. occ mild variable Reassuring NST x 3 No contractions noted GBS neg 3/13  CBC    Component Value Date/Time   WBC 10.1 07/14/2014 2135   RBC 4.06 07/14/2014 2135   HGB 12.8 07/14/2014 2135   HCT 37.0 07/14/2014 2135   PLT 264 07/14/2014 2135   MCV 91.1 07/14/2014 2135   MCH 31.5 07/14/2014 2135   MCHC 34.6 07/14/2014 2135   RDW 12.9 07/14/2014 2135   LYMPHSABS 2.2 07/14/2014 2135   MONOABS 1.0 07/14/2014 2135   EOSABS 0.2 07/14/2014 2135   BASOSABS 0.0 07/14/2014 2135      A: 25 wk 2d IUP Cervical Insufficiency s/p rescue cerclage Remote history of Epilepsy- no meds ? CHTN - stable BP but trending higher Low Nl AFI History of abnl 1gtt with nl 3gtt No s/s infection  P: Vaginal prometrium qhs Bedrest with BRP Rpt CL stable  since admission but CL functionally less than 1cm BMZ completed NICU consult done Continue NST q shift Rpt sono Tuesday for CL, AFI, growth Discuss pessary with MFM

## 2014-07-20 NOTE — H&P (Addendum)
29 yo, G2P0, 23.6 wks with rescue cerclage, here for vaginal pressure, fluid like vaginal discharge bvut just once, patient has been out of the bed and out of the house for 1 hr. She was following modified bedrest but went out and was on her feet for 1 hr. Denies contractions/ bleeding. Feels fetal movements. Office sono yesterday noted short dynamic cervix (0.9 cm closed, 3.7 cm total length- dynamic cervix).  Denies intercourse since cerclage.  S/p Rescue cerclage on 06/26/2014 at 21.9 wks. Is on vaginal Prometrium at night.  PCOS. Failed 1hr Glucola in early preg, she is on Metformin 500 mg at bedtime GERD, Zantac bid.  Maternal obesity Epilepsy hx, no meds.    Past Medical History  Diagnosis Date  . Hypertension   . Epilepsy     Last seizure 3 weeks ago  . Polycystic disease, ovaries   . Migraines   . Anxiety     Past Surgical History  Procedure Laterality Date  . Tonsillectomy    . Cervical cerclage N/A 06/26/2014    Procedure: CERCLAGE CERVICAL;  Surgeon: Lenoard Aden, MD;  Location: [facility];  Service: Gynecology;  Laterality: N/A;    Family History  Problem Relation Age of Onset  . Diabetes Mother   . Hypertension Mother   . Hypertension Father   . Alcohol abuse Maternal Grandmother   . Hearing loss Paternal Grandfather     History  Substance Use Topics  . Smoking status: Never Smoker   . Smokeless tobacco: Never Used  . Alcohol Use: No    Allergies:  Allergies  Allergen Reactions  . Codeine Itching    Prescriptions prior to admission  Medication Sig Dispense Refill Last Dose  . acetaminophen (TYLENOL) 500 MG tablet Take 1,000 mg by mouth every 4 (four) hours as needed for mild pain or moderate pain.   07/09/2014 at Unknown time  . folic acid (FOLVITE) 400 MCG tablet Take 800 mcg by mouth at bedtime.    07/09/2014 at Unknown time  . metFORMIN (GLUCOPHAGE) 500 MG tablet Take 500 mg by mouth at bedtime.   07/09/2014 at Unknown time  . Prenat w/o  A-FE-DSS-Methfol-FA (PRENATAL MULTIVITAMIN) 90-600-400 MG-MCG-MCG tablet Take 1 tablet by mouth at bedtime.    07/09/2014 at Unknown time  . progesterone (PROMETRIUM) 200 MG capsule Place 1 capsule (200 mg total) vaginally at bedtime. 30 capsule 3 07/09/2014 at Unknown time  . ranitidine (ZANTAC) 150 MG tablet Take 150 mg by mouth 2 (two) times daily as needed for heartburn.    07/09/2014 at Unknown time  . HYDROcodone-acetaminophen (NORCO/VICODIN) 5-325 MG per tablet Take 1 tablet by mouth every 4 (four) hours as needed for moderate pain. (Patient not taking: Reported on 07/10/2014) 12 tablet 0   . loratadine (CLARITIN) 10 MG tablet Take 10 mg by mouth daily as needed for allergies.   prn  . ondansetron (ZOFRAN ODT) 8 MG disintegrating tablet Take 1 tablet (8 mg total) by mouth every 8 (eight) hours as needed for nausea or vomiting. (Patient not taking: Reported on 07/10/2014) 10 tablet 0     ROS  No fever/ chills/ back pain Physical Exam   Blood pressure 124/71, pulse 90, temperature 98.8 F (37.1 C), temperature source Oral, resp. rate 16, height  (1.651 m), weight 227 lb 1.6 oz (103.012 kg), last menstrual period 12/12/2013.  Physical Exam  A&O x 3, no acute distress. Pleasant HEENT neg Lungs CTA bilat CV RRR, S1S2 normal Abdo soft, non tender, non  acute, relaxed soft uterus, no palpable contractions.  Extr no edema/ tenderness Pelvic - Spec exam- no fluid or blood. Slide- neg ferning. Wet prep negative. Stitch intact but very short cervix., no palpable cervix below the stitch.  FHTs 150s  MAU Course  Procedures Plan - Sono for AFI and vaginal sono for  CL.  Grossly slightly low amniotic fluid.  CL 0.5 cm with large wide U shaped funnel with sludge in amniotic fluid.  (office sono also noted a funnel but was a funnel and not U, closed part was 0.9 cm yesterday)  Assessment and Plan  29 yo G1 at 23.6 wks with cervical incompetence and worsening funneling. Recommend admission, bedrest.  Reassess in AM with Dr Billy Coastaavon who is aware.  Will discuss BTMZ and NICU consult in AM.  Continue vaginal Prometrium.

## 2014-07-21 ENCOUNTER — Ambulatory Visit (HOSPITAL_COMMUNITY): Payer: 59

## 2014-07-21 DIAGNOSIS — Z3689 Encounter for other specified antenatal screening: Secondary | ICD-10-CM | POA: Insufficient documentation

## 2014-07-21 DIAGNOSIS — Z3A25 25 weeks gestation of pregnancy: Secondary | ICD-10-CM | POA: Insufficient documentation

## 2014-07-21 DIAGNOSIS — O99210 Obesity complicating pregnancy, unspecified trimester: Secondary | ICD-10-CM | POA: Insufficient documentation

## 2014-07-21 NOTE — Progress Notes (Signed)
Ur chart review completed.  

## 2014-07-21 NOTE — Progress Notes (Signed)
Patient ID: [name], female   DOB: 08/19/1985, 29 y.o.   MRN: [id number] HD #9 Cervical Insufficiency 25 wks 2d with Rescue cerclage  S: No complaints No bleeding. No LOF Good FM. No CP or SOB or HA Occ vaginal pain, no pressure  O: BP 127/81 mmHg  Pulse 99  Temp(Src) 98.6 F (37 C) (Oral)  Resp 20  Ht 5\' 5"  (1.651 m)  Wt 103.012 kg (227 lb 1.6 oz)  BMI 37.79 kg/m2  LMP 12/12/2013 (LMP Unknown)  Tmax 98.8  WDWN WF NAD Neck: supple with FROM Lungs: CTA, No WRR CV: RRR, No MRG Abd: Gravid, NT, Fundus appropriate with dates No CVAT Pelvic: deferred Neuro: non focal Skin: intact  FHTs 130-150 Reassuring FHR with accels noted, BTBV 6-25. occ mild variable Reassuring NST x 3 No contractions noted GBS neg 3/13  CBC    Component Value Date/Time   WBC 10.1 07/14/2014 2135   RBC 4.06 07/14/2014 2135   HGB 12.8 07/14/2014 2135   HCT 37.0 07/14/2014 2135   PLT 264 07/14/2014 2135   MCV 91.1 07/14/2014 2135   MCH 31.5 07/14/2014 2135   MCHC 34.6 07/14/2014 2135   RDW 12.9 07/14/2014 2135   LYMPHSABS 2.2 07/14/2014 2135   MONOABS 1.0 07/14/2014 2135   EOSABS 0.2 07/14/2014 2135   BASOSABS 0.0 07/14/2014 2135      A: 25 wk 3d IUP Cervical Insufficiency s/p rescue cerclage Remote history of Epilepsy- no meds ? CHTN - stable BP but trending higher Low Nl AFI History of abnl 1gtt with nl 3gtt No s/s infection  P: Vaginal prometrium qhs Bedrest with BRP Rpt CL stable  since admission but CL functionally less than 1cm BMZ completed NICU consult done Continue NST q shift Rpt sono today for CL, AFI, growth Discuss pessary with MFM

## 2014-07-22 NOTE — Progress Notes (Signed)
Patient ID: Brooke [name], female   DOB: [birth year], 29 y.o.   MRN: [id number] HD #10 Cervical Insufficiency 25 wks 4d with Rescue cerclage  S: No complaints No bleeding. No LOF Good FM. No CP or SOB or HA Occ vaginal pain, no pressure  O: BP 115/71 mmHg  Pulse 90  Temp(Src) 98.6 F (37 C) (Oral)  Resp 18  Ht 5\' 5"  (1.651 m)  Wt 103.012 kg (227 lb 1.6 oz)  BMI 37.79 kg/m2  LMP 12/12/2013 (LMP Unknown)  Tmax 98.8  WDWN WF NAD Neck: supple with FROM Lungs: CTA, No WRR CV: RRR, No MRG Abd: Gravid, NT, Fundus appropriate with dates No CVAT Pelvic: deferred Neuro: non focal Skin: intact  FHTs 140-150 Reassuring FHR with accels noted, BTBV 6-25. occ mild variable Reassuring NST x 3 No contractions noted GBS neg 3/13 Sono 3/14 c/w AGA, vtx, nl AFI, CL 0-714mm  CBC    Component Value Date/Time   WBC 10.1 07/14/2014 2135   RBC 4.06 07/14/2014 2135   HGB 12.8 07/14/2014 2135   HCT 37.0 07/14/2014 2135   PLT 264 07/14/2014 2135   MCV 91.1 07/14/2014 2135   MCH 31.5 07/14/2014 2135   MCHC 34.6 07/14/2014 2135   RDW 12.9 07/14/2014 2135   LYMPHSABS 2.2 07/14/2014 2135   MONOABS 1.0 07/14/2014 2135   EOSABS 0.2 07/14/2014 2135   BASOSABS 0.0 07/14/2014 2135      A: 25 wk 4d IUP Cervical Insufficiency s/p rescue cerclage Remote history of Epilepsy- no meds ? CHTN - stable BP but trending higher Low Nl AFI History of abnl 1gtt with nl 3gtt No s/s infection  P: Vaginal prometrium qhs Bedrest with BRP Rpt CL stable  since admission but CL functionally less than 1cm BMZ completed NICU consult done Continue NST q shift Discuss pessary with MFM

## 2014-07-22 NOTE — Progress Notes (Signed)
RN informed by Pt and family member that she would not like to be disturbed or checked on throughout the night.  RN informed pt that she would respect her wishes and that if she needed anything throughout the night to please use the call light.

## 2014-07-23 ENCOUNTER — Inpatient Hospital Stay (HOSPITAL_COMMUNITY): Payer: 59

## 2014-07-23 LAB — TYPE AND SCREEN
ABO/RH(D): B POS
ANTIBODY SCREEN: NEGATIVE

## 2014-07-23 NOTE — Progress Notes (Signed)
Patient ID: Brooke [name], female   DOB: 06-26-1985, 29 y.o.   MRN: [id number] HD #11 Cervical Insufficiency 25 wks 5d with Rescue cerclage  S: No complaints No bleeding. No LOF Good FM. No CP or SOB or HA Occ vaginal pain, no pressure  O: BP 123/76 mmHg  Pulse 91  Temp(Src) 98.6 F (37 C) (Oral)  Resp 16  Ht 5\' 5"  (1.651 m)  Wt 102.649 kg (226 lb 4.8 oz)  BMI 37.66 kg/m2  LMP 12/12/2013 (LMP Unknown)  Tmax 98.8  WDWN WF NAD Neck: supple with FROM Lungs: CTA, No WRR CV: RRR, No MRG Abd: Gravid, NT, Fundus appropriate with dates No CVAT Pelvic: deferred Neuro: non focal Skin: intact  FHTs 140-160 Reassuring FHR with accels noted, BTBV 6-25. occ mild variable Reassuring NST x 3 No contractions noted GBS neg 3/13 Sono 3/14 c/w AGA, vtx, nl AFI, CL 0-364mm  CBC    Component Value Date/Time   WBC 10.1 07/14/2014 2135   RBC 4.06 07/14/2014 2135   HGB 12.8 07/14/2014 2135   HCT 37.0 07/14/2014 2135   PLT 264 07/14/2014 2135   MCV 91.1 07/14/2014 2135   MCH 31.5 07/14/2014 2135   MCHC 34.6 07/14/2014 2135   RDW 12.9 07/14/2014 2135   LYMPHSABS 2.2 07/14/2014 2135   MONOABS 1.0 07/14/2014 2135   EOSABS 0.2 07/14/2014 2135   BASOSABS 0.0 07/14/2014 2135      A: 25 wk 5d IUP Cervical Insufficiency s/p rescue cerclage Remote history of Epilepsy- no meds ? CHTN - stable BP but trending higher Low Nl AFI not improved History of abnl 1gtt with nl 3gtt No s/s infection  P: Vaginal prometrium qhs Bedrest with BRP Rpt CL stable  since admission but CL functionally less than 1cm BMZ completed NICU consult done Continue NST q shift Discuss pessary with MFM Serial CL weekly Continue inpatient management

## 2014-07-23 NOTE — Progress Notes (Signed)
MFM note  Ms. Brooke [name] is a 29 yo G1P0 currently at 25w 5d who is s/p cerclage at ~ 22 weeks who is now admitted for observation and a course of betamethasone due to worsening cervical shortening.  Ultrasound earlier this week revealed V-shaped funneling with membranes extending beyond the cerclage stitch with a functional cervical length of 4-6 mm.  She is currently on vaginal progesterone.  Ms. Brooke [name] was seen and briefly counseled regarding possible cervical pessary. We discussed the fact that while at least one randomized controled study showed a benefit in decreasing the risk of preterm delivery in women with shortened cervix, it is by no means the standard of care and at this point is an experimental therapy.  It is felt to be a low risk intervention that may or may not have any benefit in her case.  It is not felt to increase the risk of infectious complications.  After counseling, Ms. Brooke [name] elected not to undergo a trial of cervical pessary.  Would recommend continued in-house observation for now and vaginal progesterone supplementation.    Alpha GulaPaul Deen Deguia, MD

## 2014-07-23 NOTE — Progress Notes (Signed)
PT Note  Order received. Pt on bedrest with bathroom privileges. Will see pt tomorrow for exercises and education.  Fluor CorporationCary Mekhia Brogan PT [phone]

## 2014-07-24 NOTE — Progress Notes (Signed)
Patient ID: Brooke [name], female   DOB: 1986-03-23, 29 y.o.   MRN: [id number] HD #12 Cervical Insufficiency 25 wks 6d with Rescue cerclage  S: No complaints No bleeding. No LOF Good FM. No CP or SOB or HA Occ vaginal pain, no pressure  O: BP 122/64 mmHg  Pulse 87  Temp(Src) 98.7 F (37.1 C) (Oral)  Resp 18  Ht 5\' 5"  (1.651 m)  Wt 102.649 kg (226 lb 4.8 oz)  BMI 37.66 kg/m2  LMP 12/12/2013 (LMP Unknown)  Tmax 98.8  WDWN WF NAD Neck: supple with FROM Lungs: CTA, No WRR CV: RRR, No MRG Abd: Gravid, NT, Fundus appropriate with dates No CVAT Pelvic: deferred Neuro: non focal Skin: intact  FHTs 140-160 Reassuring FHR with accels noted, BTBV 6-25. occ mild variable Reassuring NST x 3 No contractions noted GBS neg 3/13 Sono 3/14 c/w AGA, vtx, nl AFI, CL 0-44mm  CBC    Component Value Date/Time   WBC 10.1 07/14/2014 2135   RBC 4.06 07/14/2014 2135   HGB 12.8 07/14/2014 2135   HCT 37.0 07/14/2014 2135   PLT 264 07/14/2014 2135   MCV 91.1 07/14/2014 2135   MCH 31.5 07/14/2014 2135   MCHC 34.6 07/14/2014 2135   RDW 12.9 07/14/2014 2135   LYMPHSABS 2.2 07/14/2014 2135   MONOABS 1.0 07/14/2014 2135   EOSABS 0.2 07/14/2014 2135   BASOSABS 0.0 07/14/2014 2135      A: 25 wk 6d IUP Cervical Insufficiency s/p rescue cerclage, pt declined pessary trial Remote history of Epilepsy- no meds ? CHTN - stable BP but trending higher Low Nl AFI not improved History of abnl 1gtt with nl 3gtt No s/s infection  P: Vaginal prometrium qhs Bedrest with BRP Rpt CL stable  since admission but CL functionally less than 1cm BMZ completed NICU consult done Continue NST q shift Serial CL weekly Continue inpatient management

## 2014-07-24 NOTE — Progress Notes (Addendum)
PT Cancellation Note  Patient Details Name: Brooke [name] MRN: [id number] DOB: 11-22-1985   Cancelled Treatment:    Reason Eval/Treat Not Completed: Medical issues which prohibited therapy (pt with developing headache )   Finesse Fielder 07/24/2014, 9:07 AM  Skip Mayerary Daimion Adamcik PT [phone]

## 2014-07-25 NOTE — Progress Notes (Signed)
Patient ID: Brooke [name], female   DOB: Jan 21, 1986, 29 y.o.   MRN: [id number] HD #13 Cervical Insufficiency 26wks 0d with Rescue cerclage  S: No complaints No bleeding. No LOF Good FM. No CP or SOB or HA Occ vaginal pain, no pressure  O: BP 138/83 mmHg  Pulse 109  Temp(Src) 98.1 F (36.7 C) (Oral)  Resp 20  Ht 5\' 5"  (1.651 m)  Wt 102.649 kg (226 lb 4.8 oz)  BMI 37.66 kg/m2  LMP 12/12/2013 (LMP Unknown)  Tmax 99.0  WDWN WF NAD Neck: supple with FROM Lungs: CTA, No WRR CV: RRR, No MRG Abd: Gravid, NT, Fundus appropriate with dates No CVAT Pelvic: deferred Neuro: non focal Skin: intact  FHTs 140-160 Reassuring FHR with accels noted, BTBV 6-25. occ mild variable Reassuring NST x 3 No contractions noted GBS neg 3/13 Sono 3/14 c/w AGA, vtx, nl AFI, CL 0-774mm  CBC    Component Value Date/Time   WBC 10.1 07/14/2014 2135   RBC 4.06 07/14/2014 2135   HGB 12.8 07/14/2014 2135   HCT 37.0 07/14/2014 2135   PLT 264 07/14/2014 2135   MCV 91.1 07/14/2014 2135   MCH 31.5 07/14/2014 2135   MCHC 34.6 07/14/2014 2135   RDW 12.9 07/14/2014 2135   LYMPHSABS 2.2 07/14/2014 2135   MONOABS 1.0 07/14/2014 2135   EOSABS 0.2 07/14/2014 2135   BASOSABS 0.0 07/14/2014 2135      A: 26 wk 0d IUP Cervical Insufficiency s/p rescue cerclage, pt declined pessary trial Remote history of Epilepsy- no meds ? CHTN - stable BP but trending higher Low Nl AFI not improved History of abnl 1gtt with nl 3gtt No s/s infection  P: Vaginal prometrium qhs Bedrest with BRP Rpt CL stable  since admission but CL functionally less than 1cm BMZ completed NICU consult done Continue NST q shift Serial CL weekly Continue inpatient management

## 2014-07-25 NOTE — Patient Instructions (Addendum)
  CARPAL TUNNEL (Nerve Compression Syndrome): Reverse Namaste   Reach behind back and bring palms together with fingers up. Hold position for ___ breaths. Repeat___ times. Do ___ times per day.  Copyright  VHI. All rights reserved.  CARPAL TUNNEL (Nerve Compression Syndrome): Hug   Cross right arm over left and walk hands behind shoulders. Squeeze shoulders and hold position for ___ breaths. Switch arms and repeat. Repeat ___ times, alternating arms. Do ___ times per day.  Copyright  VHI. All rights reserved.  CARPAL TUNNEL (Nerve Compression Syndrome): [employer]   Bring right arm in front, elbow bent, forearm up. Reach left arm under right and, if possible, bring left fingers into right palm, thumbs facing body. (If not able to bring fingers into palm, just hold position where comfortable.) Hold position for ___ breaths. Switch arms and repeat. Repeat ___ times. Do ___ times per day.  Copyright  VHI. All rights reserved.

## 2014-07-26 LAB — TYPE AND SCREEN
ABO/RH(D): B POS
Antibody Screen: NEGATIVE

## 2014-07-26 NOTE — Progress Notes (Signed)
Patient ID: Brooke [name], female   DOB: 1986-03-20, 29 y.o.   MRN: [id number] HD #14 Cervical Insufficiency 26wks 1d with Rescue cerclage  S: No complaints No bleeding. No LOF Good FM. No CP or SOB or HA Occ vaginal pain, no pressure  O: BP 124/74 mmHg  Pulse 88  Temp(Src) 98.5 F (36.9 C) (Oral)  Resp 180  Ht 5\' 5"  (1.651 m)  Wt 102.649 kg (226 lb 4.8 oz)  BMI 37.66 kg/m2  LMP 12/12/2013 (LMP Unknown)  Tmax 98.5  WDWN WF NAD Neck: supple with FROM Lungs: CTA, No WRR CV: RRR, No MRG Abd: Gravid, NT, Fundus appropriate with dates No CVAT Pelvic: deferred Neuro: non focal Skin: intact  FHTs 140-160 Reassuring FHR with accels noted, BTBV 6-25. occ mild variable Reassuring NST x 3 No contractions noted GBS neg 3/13 Sono 3/14 c/w AGA, vtx, nl AFI, CL 0-154mm  CBC    Component Value Date/Time   WBC 10.1 07/14/2014 2135   RBC 4.06 07/14/2014 2135   HGB 12.8 07/14/2014 2135   HCT 37.0 07/14/2014 2135   PLT 264 07/14/2014 2135   MCV 91.1 07/14/2014 2135   MCH 31.5 07/14/2014 2135   MCHC 34.6 07/14/2014 2135   RDW 12.9 07/14/2014 2135   LYMPHSABS 2.2 07/14/2014 2135   MONOABS 1.0 07/14/2014 2135   EOSABS 0.2 07/14/2014 2135   BASOSABS 0.0 07/14/2014 2135      A: 26 wk 1d IUP Cervical Insufficiency s/p rescue cerclage, CL stable,pt declined pessary trial Remote history of Epilepsy- no meds ? CHTN - stable BP but trending higher Low Nl AFI now improved on last sono History of abnl 1gtt with nl 3gtt No s/s infection  P: Vaginal prometrium qhs Bedrest with BRP BMZ completed NICU consult done Continue NST q shift Serial CL weekly Continue inpatient management

## 2014-07-26 NOTE — Evaluation (Signed)
Physical Therapy Evaluation Late entry for 07/25/14 Patient Details Name: Brooke [name] MRN: [id number] DOB: 07-Apr-1986 Today's Date: 07/26/2014   History of Present Illness  29 yo G1 at 26 wks with cervical incompetence and worsening funneling  Clinical Impression  Patient and family demonstrated understanding of all exercises and positioning.  No further PT needs identified.  Will sign off.  Feel free to consult PT again as needed.    Follow Up Recommendations No PT follow up    Equipment Recommendations  None recommended by PT    Recommendations for Other Services       Precautions / Restrictions Precautions Precaution Comments: bedrest with BRP      Mobility  Bed Mobility                  Transfers                 General transfer comment: discussed proper body mechanics when getting OOB - to transition to sidelying and then sitting - less pressure on cervix.  Ambulation/Gait                Stairs            Wheelchair Mobility    Modified Rankin (Stroke Patients Only)       Balance                                             Pertinent Vitals/Pain Pain Assessment: No/denies pain    Home Living Family/patient expects to be discharged to:: Private residence Living Arrangements: Spouse/significant other                    Prior Function Level of Independence: Independent               Hand Dominance        Extremity/Trunk Assessment   Upper Extremity Assessment: Overall WFL for tasks assessed           Lower Extremity Assessment: Generalized weakness (resistance not given)      Cervical / Trunk Assessment: Normal  Communication   Communication: No difficulties  Cognition Arousal/Alertness: Awake/alert Behavior During Therapy: WFL for tasks assessed/performed Overall Cognitive Status: Within Functional Limits for tasks assessed                      General  Comments      Exercises Antenatal Exercises Ankle Circles/Pumps: AROM;Both;10 reps;Supine;Strengthening Quad Sets: AROM;Strengthening;Both;10 reps;Supine Short Arc Quad: AROM;Strengthening;Supine;Both;10 reps Hip ABduction/ADduction: AROM;Strengthening;Supine;Both;10 reps Sidelying Hip Flexor Stretch: PROM;Right;Limitations Sidelying Hip Flexor Stretch Limitations: husband demonstrated stretch on right side Other Exercises Other Exercises: discussed positioning with patient to avoid back/upper back pain.  Discussed using a pillow between legs when sidelying.      Assessment/Plan    PT Assessment Patent does not need any further PT services  PT Diagnosis Generalized weakness   PT Problem List    PT Treatment Interventions     PT Goals (Current goals can be found in the Care Plan section) Acute Rehab PT Goals PT Goal Formulation: All assessment and education complete, DC therapy    Frequency     Barriers to discharge        Co-evaluation               End of Session   Activity Tolerance:  Patient tolerated treatment well Patient left: in bed;with call bell/phone within reach;with family/visitor present           Time: 1610-9604 PT Time Calculation (min) (ACUTE ONLY): 20 min   Charges:   PT Evaluation $Initial PT Evaluation Tier I: 1 Procedure     PT G CodesOlivia Canter 07/26/2014, 11:46 AM  07/26/2014 Corlis Hove, PT [phone]

## 2014-07-27 NOTE — Progress Notes (Signed)
Patient ID: Brooke [name], female   DOB: 04-07-1986, 29 y.o.   MRN: [id number] HD #15 Cervical Insufficiency 26wks 2d with Rescue cerclage  S: No complaints No bleeding. No LOF Good FM. No CP or SOB or HA Occ vaginal pain, no pressure  O: BP 104/67 mmHg  Pulse 95  Temp(Src) 98.5 F (36.9 C) (Oral)  Resp 20  Ht 5\' 5"  (1.651 m)  Wt 102.649 kg (226 lb 4.8 oz)  BMI 37.66 kg/m2  LMP 12/12/2013 (LMP Unknown)  Tmax 98.7  WDWN WF NAD Neck: supple with FROM Lungs: CTA, No WRR CV: RRR, No MRG Abd: Gravid, NT, Fundus appropriate with dates No CVAT Pelvic: deferred Neuro: non focal Skin: intact  FHTs 140-160 Reassuring FHR with accels noted, BTBV 6-25. occ mild variable Reassuring NST x 3 No contractions noted GBS neg 3/13 Sono 3/14 c/w AGA, vtx, nl AFI, CL 0-714mm  CBC    Component Value Date/Time   WBC 10.1 07/14/2014 2135   RBC 4.06 07/14/2014 2135   HGB 12.8 07/14/2014 2135   HCT 37.0 07/14/2014 2135   PLT 264 07/14/2014 2135   MCV 91.1 07/14/2014 2135   MCH 31.5 07/14/2014 2135   MCHC 34.6 07/14/2014 2135   RDW 12.9 07/14/2014 2135   LYMPHSABS 2.2 07/14/2014 2135   MONOABS 1.0 07/14/2014 2135   EOSABS 0.2 07/14/2014 2135   BASOSABS 0.0 07/14/2014 2135      A: 26 wk 2d IUP Cervical Insufficiency s/p rescue cerclage, CL stable,pt declined pessary trial Remote history of Epilepsy- no meds ? CHTN - stable BP but trending higher Low Nl AFI now improved on last sono History of abnl 1gtt with nl 3gtt No s/s infection  P: Vaginal prometrium qhs Bedrest with BRP BMZ completed NICU consult done Continue NST q shift Serial CL weekly Continue inpatient management

## 2014-07-28 ENCOUNTER — Ambulatory Visit (HOSPITAL_COMMUNITY): Payer: 59

## 2014-07-28 DIAGNOSIS — O3430 Maternal care for cervical incompetence, unspecified trimester: Secondary | ICD-10-CM | POA: Insufficient documentation

## 2014-07-28 DIAGNOSIS — Z3A26 26 weeks gestation of pregnancy: Secondary | ICD-10-CM | POA: Insufficient documentation

## 2014-07-28 NOTE — Progress Notes (Signed)
Patient ID: Brooke [name], female   DOB: [birth year], 31 y.o.   MRN: [id number] HD #16 Cervical Insufficiency 26wks 3d with Rescue cerclage  S: No complaints No bleeding. No LOF Good FM. No CP or SOB or HA Occ vaginal pain, no pressure  O: BP 133/53 mmHg  Pulse 103  Temp(Src) 98.2 F (36.8 C) (Oral)  Resp 20  Ht 5\' 5"  (1.651 m)  Wt 102.649 kg (226 lb 4.8 oz)  BMI 37.66 kg/m2  LMP 12/12/2013 (LMP Unknown)  Tmax 98.9  WDWN WF NAD Neck: supple with FROM Lungs: CTA, No WRR CV: RRR, No MRG Abd: Gravid, NT, Fundus appropriate with dates No CVAT Pelvic: deferred Neuro: non focal Skin: intact  FHTs 140-160 Reassuring FHR with accels noted, BTBV 6-25. occ mild variable Reassuring NST x 3 No contractions noted GBS neg 3/13 Sono 3/14 c/w AGA, vtx, nl AFI, CL 0-94mm Sono today c/w 1cm CL with funneling  CBC    Component Value Date/Time   WBC 10.1 07/14/2014 2135   RBC 4.06 07/14/2014 2135   HGB 12.8 07/14/2014 2135   HCT 37.0 07/14/2014 2135   PLT 264 07/14/2014 2135   MCV 91.1 07/14/2014 2135   MCH 31.5 07/14/2014 2135   MCHC 34.6 07/14/2014 2135   RDW 12.9 07/14/2014 2135   LYMPHSABS 2.2 07/14/2014 2135   MONOABS 1.0 07/14/2014 2135   EOSABS 0.2 07/14/2014 2135   BASOSABS 0.0 07/14/2014 2135      A: 26 wk 3d IUP Cervical Insufficiency s/p rescue cerclage, CL stable on sono today,pt declined pessary trial Remote history of Epilepsy- no meds ? CHTN - stable BP but trending higher Low Nl AFI now improved on last sono History of abnl 1gtt with nl 3gtt No s/s infection  P: Vaginal prometrium qhs Bedrest with BRP BMZ completed NICU consult done Continue NST q shift Weekly sono to ck CL until 28 weeks. Serial CL weekly Continue inpatient management

## 2014-07-29 LAB — TYPE AND SCREEN
ABO/RH(D): B POS
Antibody Screen: NEGATIVE

## 2014-07-29 MED ORDER — BUTALBITAL-APAP-CAFFEINE 50-325-40 MG PO TABS
2.0000 | ORAL_TABLET | ORAL | Status: AC
  Administered 2014-07-29: 2 via ORAL
  Filled 2014-07-29: qty 2

## 2014-07-29 NOTE — Progress Notes (Signed)
Patient ID: [name], female   DOB: [birth year], 29 y.o.   MRN: [id number] HD #17 Cervical Insufficiency 26wks 4d with Rescue cerclage  S: No complaints No bleeding. No LOF Good FM. No CP or SOB or HA Occ vaginal pain, no pressure  O: BP 134/86 mmHg  Pulse 94  Temp(Src) 98.6 F (37 C) (Oral)  Resp 20  Ht 5\' 5"  (1.651 m)  Wt 102.649 kg (226 lb 4.8 oz)  BMI 37.66 kg/m2  LMP 12/12/2013 (LMP Unknown)  Tmax 99.1  WDWN WF NAD Neck: supple with FROM Lungs: CTA, No WRR CV: RRR, No MRG Abd: Gravid, NT, Fundus appropriate with dates No CVAT Pelvic: deferred Neuro: non focal Skin: intact  FHTs 140-160 Reassuring FHR with accels noted, BTBV 6-25. occ mild variable Reassuring NST x 3 No contractions noted GBS neg 3/13 Sono 3/14 c/w AGA, vtx, nl AFI, CL 0-344mm Sono today c/w 1cm CL with funneling  CBC    Component Value Date/Time   WBC 10.1 07/14/2014 2135   RBC 4.06 07/14/2014 2135   HGB 12.8 07/14/2014 2135   HCT 37.0 07/14/2014 2135   PLT 264 07/14/2014 2135   MCV 91.1 07/14/2014 2135   MCH 31.5 07/14/2014 2135   MCHC 34.6 07/14/2014 2135   RDW 12.9 07/14/2014 2135   LYMPHSABS 2.2 07/14/2014 2135   MONOABS 1.0 07/14/2014 2135   EOSABS 0.2 07/14/2014 2135   BASOSABS 0.0 07/14/2014 2135      A: 26 wk 4d IUP Cervical Insufficiency s/p rescue cerclage, CL stable on sono today,pt declined pessary trial Remote history of Epilepsy- no meds ? CHTN - stable BP but trending higher Low Nl AFI now improved on last sono History of abnl 1gtt with nl 3gtt No s/s infection  P: Vaginal prometrium qhs Bedrest with BRP BMZ completed NICU consult done Continue NST q shift Weekly sono to ck CL until 28 weeks. Continue inpatient management

## 2014-07-30 NOTE — Progress Notes (Signed)
Patient ID: Brooke [name], female   DOB: [birth year], 29 y.o.   MRN: [id number] HD #18 Cervical Insufficiency 26wks 5d with Rescue cerclage  S: No complaints No bleeding. No LOF Good FM. No CP or SOB or HA Occ vaginal pain, no pressure  O: BP 144/86 mmHg  Pulse 89  Temp(Src) 98.8 F (37.1 C) (Oral)  Resp 20  Ht 5\' 5"  (1.651 m)  Wt 102.649 kg (226 lb 4.8 oz)  BMI 37.66 kg/m2  LMP 12/12/2013 (LMP Unknown)  Tmax 98.8  WDWN WF NAD Neck: supple with FROM Lungs: CTA, No WRR CV: RRR, No MRG Abd: Gravid, NT, Fundus appropriate with dates No CVAT Pelvic: deferred Neuro: non focal Skin: intact  FHTs 140-160 Reassuring FHR with accels noted, BTBV 6-25. occ mild variable Reassuring NST x 3 No contractions noted GBS neg 3/13 Sono 3/14 c/w AGA, vtx, nl AFI, CL 0-544mm Sono 3/21 c/w 1cm CL with funneling to cerclage  CBC    Component Value Date/Time   WBC 10.1 07/14/2014 2135   RBC 4.06 07/14/2014 2135   HGB 12.8 07/14/2014 2135   HCT 37.0 07/14/2014 2135   PLT 264 07/14/2014 2135   MCV 91.1 07/14/2014 2135   MCH 31.5 07/14/2014 2135   MCHC 34.6 07/14/2014 2135   RDW 12.9 07/14/2014 2135   LYMPHSABS 2.2 07/14/2014 2135   MONOABS 1.0 07/14/2014 2135   EOSABS 0.2 07/14/2014 2135   BASOSABS 0.0 07/14/2014 2135      A: 26 wk 5d IUP Cervical Insufficiency s/p rescue cerclage, CL stable on sono today,pt declined pessary trial Remote history of Epilepsy- no meds ? CHTN - stable BP but trending higher Low Nl AFI now improved on last sono History of abnl 1gtt with nl 3gtt No s/s infection  P: Vaginal prometrium qhs Bedrest with BRP BMZ completed NICU consult done Continue NST q shift Weekly sono to ck CL until 28 weeks. Continue inpatient management

## 2014-07-30 NOTE — Plan of Care (Signed)
Problem: Phase I Progression Outcomes Goal: LOS < 4 days Outcome: Not Met (add Reason) Patient admitted to antenatal for prolonged hospitalization.

## 2014-07-31 MED ORDER — PSEUDOEPHEDRINE HCL 30 MG PO TABS
30.0000 mg | ORAL_TABLET | Freq: Four times a day (QID) | ORAL | Status: DC | PRN
Start: 2014-07-31 — End: 2014-09-23
  Administered 2014-07-31 – 2014-09-21 (×13): 30 mg via ORAL
  Filled 2014-07-31 (×13): qty 1

## 2014-07-31 NOTE — Progress Notes (Signed)
Patient ID: Brooke [name], female   DOB: [birth year], 29 y.o.   MRN: [id number] HD #19 Cervical Insufficiency 26wks 6d with Rescue cerclage  S: No complaints No bleeding. No LOF Good FM. No CP or SOB or HA Occ vaginal pain, no pressure  O: BP 127/74 mmHg  Pulse 91  Temp(Src) 98.6 F (37 C) (Oral)  Resp 20  Ht 5\' 5"  (1.651 m)  Wt 102.876 kg (226 lb 12.8 oz)  BMI 37.74 kg/m2  LMP 12/12/2013 (LMP Unknown)  Tmax 98.8  WDWN WF NAD Neck: supple with FROM Lungs: CTA, No WRR CV: RRR, No MRG Abd: Gravid, NT, Fundus appropriate with dates No CVAT Pelvic: deferred Neuro: non focal Skin: intact  FHTs 140-160 Reassuring FHR with accels noted, BTBV 6-25. occ mild variable Reassuring NST x 3 No contractions noted GBS neg 3/13 Sono 3/14 c/w AGA, vtx, nl AFI, CL 0-644mm Sono 3/21 c/w 1cm CL with funneling to cerclage  CBC    Component Value Date/Time   WBC 10.1 07/14/2014 2135   RBC 4.06 07/14/2014 2135   HGB 12.8 07/14/2014 2135   HCT 37.0 07/14/2014 2135   PLT 264 07/14/2014 2135   MCV 91.1 07/14/2014 2135   MCH 31.5 07/14/2014 2135   MCHC 34.6 07/14/2014 2135   RDW 12.9 07/14/2014 2135   LYMPHSABS 2.2 07/14/2014 2135   MONOABS 1.0 07/14/2014 2135   EOSABS 0.2 07/14/2014 2135   BASOSABS 0.0 07/14/2014 2135      A: 26 wk 6d IUP Cervical Insufficiency s/p rescue cerclage, CL stable on sono today,pt declined pessary trial Remote history of Epilepsy- no meds ? CHTN - stable BP but trending higher Low Nl AFI now improved on last sono History of abnl 1gtt with nl 3gtt No s/s infection  P: Vaginal prometrium qhs Bedrest with BRP BMZ completed NICU consult done Continue NST q shift Weekly sono to ck CL until 28 weeks. Continue inpatient management

## 2014-08-01 LAB — TYPE AND SCREEN
ABO/RH(D): B POS
Antibody Screen: NEGATIVE

## 2014-08-01 MED ORDER — MENTHOL 3 MG MT LOZG
1.0000 | LOZENGE | OROMUCOSAL | Status: DC | PRN
  Administered 2014-08-01: 3 mg via ORAL
  Filled 2014-08-01: qty 9

## 2014-08-01 NOTE — Progress Notes (Addendum)
Patient ID: Brooke [name], female   DOB: 06-Oct-1985, 29 y.o.   MRN: [id number] HD # 23 (admission 07/10/14) Cervical Insufficiency 27.0 wks with Rescue cerclage  S: Cold, nasal stuffiness, throat pain but able to eat/ swallow, no fever/chills. No sick contact.  No bleeding. No LOF Good FM. No CP or SOB or HA Occ vaginal pain, no pressure  O: BP 132/78 mmHg  Pulse 102  Temp(Src) 98.2 F (36.8 C) (Oral)  Resp 20  Ht 5\' 5"  (1.651 m)  Wt 226 lb 12.8 oz (102.876 kg)  BMI 37.74 kg/m2  LMP 12/12/2013 (LMP Unknown)  NAD. pleasant Lungs: CTA, No WRR CV: RRR, no abn sounds Abd: Gravid, NT, Fundus appropriate with dates, soft No CVAT Pelvic: deferred Neuro: non focal  FHTs 140-160 Reassuring FHR with accels noted, BTBV 6-25. occ mild variable Reassuring NST x 3 No contractions noted GBS neg 3/13 Sono 3/14 c/w AGA, vtx, nl AFI, CL 0-744mm Sono 3/21 c/w 1cm CL with funneling to cerclage  CBC    Component Value Date/Time   WBC 10.1 07/14/2014 2135   RBC 4.06 07/14/2014 2135   HGB 12.8 07/14/2014 2135   HCT 37.0 07/14/2014 2135   PLT 264 07/14/2014 2135   MCV 91.1 07/14/2014 2135   MCH 31.5 07/14/2014 2135   MCHC 34.6 07/14/2014 2135   RDW 12.9 07/14/2014 2135   LYMPHSABS 2.2 07/14/2014 2135   MONOABS 1.0 07/14/2014 2135   EOSABS 0.2 07/14/2014 2135   BASOSABS 0.0 07/14/2014 2135      A: 27.0 wk IUP Cervical Insufficiency s/p rescue cerclage, CL stable, pt declined pessary trial Remote history of Epilepsy- no meds ? CHTN - stable BP, continue to watch, no meds Low Nl AFI improved on last sono History of abnl 1gtt with nl 3gtt in 1st tri. Repeat 3hr GTT due No s/s infection  P: Vaginal prometrium qhs Bedrest with BRP BMZ completed NICU consult done Continue NST q shift Weekly sono to ck CL until 28 weeks. Continue inpatient management

## 2014-08-02 MED ORDER — AZITHROMYCIN 250 MG PO TABS
250.0000 mg | ORAL_TABLET | Freq: Every day | ORAL | Status: AC
  Administered 2014-08-03 – 2014-08-06 (×4): 250 mg via ORAL
  Filled 2014-08-02 (×4): qty 1

## 2014-08-02 MED ORDER — GUAIFENESIN 100 MG/5ML PO SOLN
10.0000 mL | Freq: Four times a day (QID) | ORAL | Status: DC | PRN
  Administered 2014-08-02 – 2014-08-09 (×9): 200 mg via ORAL
  Filled 2014-08-02 (×9): qty 15

## 2014-08-02 MED ORDER — AZITHROMYCIN 250 MG PO TABS
500.0000 mg | ORAL_TABLET | Freq: Every day | ORAL | Status: AC
  Administered 2014-08-02: 500 mg via ORAL
  Filled 2014-08-02: qty 2

## 2014-08-02 NOTE — Progress Notes (Signed)
Hospital day # 23 pregnancy at 2344w1d  Cervical incompetence, rescue cerclage.  BMethasone completed.  S: well, except for a productive cough with greenish secretions.  No chills.  No SOB.       Reports good fetal activity      Contractions:none      Vaginal bleeding:none now       Vaginal discharge: no significant change  O: BP 129/71 mmHg  Pulse 93  Temp(Src) 98.4 F (36.9 C) (Oral)  Resp 18  Ht 5\' 5"  (1.651 m)  Wt 226 lb 12.8 oz (102.876 kg)  BMI 37.74 kg/m2  LMP 12/12/2013 (LMP Unknown)      Fetal tracings:Fetal heart variability: moderate, Base line 140's reviewed and reassuring from last night, pending this am.      Lungs good A/E bilat. Mild wheezing upper Rt lung.  RCR.      Uterus non-tender      Extremities: no significant edema and no signs of DVT  A: 2744w1d with Incompetent cervix post rescue cerclage.  Stable.  BMethasone completed.     Probable Bronchitis with greenish productive cough/afebrile.       P: Continue current plan of care for Incompetent cervix.  Fetal monitoring unchanged.      Start on Z-Pak.  Robitussin PRN.  Tanvir Hipple,MARIE-LYNE  MD 08/02/2014 10:54 AM

## 2014-08-03 NOTE — Progress Notes (Signed)
Hospital day # 24 pregnancy at 2815w2d Incompetent cervix, rescue cerclage.  S: well, reports good fetal activity      Contractions:none      Vaginal bleeding:none now       Vaginal discharge: no significant change  O: BP 121/70 mmHg  Pulse 92  Temp(Src) 98.3 F (36.8 C) (Oral)  Resp 16  Ht 5\' 5"  (1.651 m)  Wt 226 lb 12.8 oz (102.876 kg)  BMI 37.74 kg/m2  LMP 12/12/2013 (LMP Unknown)      Fetal tracings:Fetal heart variability: moderate, base line 130's with good accelerations, no deceleration, reviewed and reassuring      Uterus non-tender      Extremities: no significant edema and no signs of DVT  A: 6315w2d with Incompetent cervix, rescue cerclage.  Stable.  Fetal well-being reassuring.     Probable bronchitis on Z-Pak and Robitussin, improved Sxs.       P: Continue current plan of care.  Chasen Mendell,MARIE-LYNE  MD 08/03/2014 10:19 AM

## 2014-08-04 ENCOUNTER — Inpatient Hospital Stay (HOSPITAL_COMMUNITY): Payer: 59

## 2014-08-04 DIAGNOSIS — Z3A34 34 weeks gestation of pregnancy: Secondary | ICD-10-CM | POA: Diagnosis present

## 2014-08-04 DIAGNOSIS — K219 Gastro-esophageal reflux disease without esophagitis: Secondary | ICD-10-CM | POA: Diagnosis present

## 2014-08-04 DIAGNOSIS — Z6839 Body mass index (BMI) 39.0-39.9, adult: Secondary | ICD-10-CM

## 2014-08-04 DIAGNOSIS — O99352 Diseases of the nervous system complicating pregnancy, second trimester: Secondary | ICD-10-CM | POA: Diagnosis present

## 2014-08-04 DIAGNOSIS — Z3A27 27 weeks gestation of pregnancy: Secondary | ICD-10-CM | POA: Insufficient documentation

## 2014-08-04 DIAGNOSIS — O99214 Obesity complicating childbirth: Secondary | ICD-10-CM | POA: Diagnosis present

## 2014-08-04 DIAGNOSIS — O9952 Diseases of the respiratory system complicating childbirth: Secondary | ICD-10-CM | POA: Diagnosis present

## 2014-08-04 DIAGNOSIS — J069 Acute upper respiratory infection, unspecified: Secondary | ICD-10-CM | POA: Diagnosis present

## 2014-08-04 DIAGNOSIS — O42913 Preterm premature rupture of membranes, unspecified as to length of time between rupture and onset of labor, third trimester: Secondary | ICD-10-CM | POA: Diagnosis present

## 2014-08-04 DIAGNOSIS — G40909 Epilepsy, unspecified, not intractable, without status epilepticus: Secondary | ICD-10-CM | POA: Diagnosis present

## 2014-08-04 DIAGNOSIS — O288 Other abnormal findings on antenatal screening of mother: Secondary | ICD-10-CM | POA: Insufficient documentation

## 2014-08-04 DIAGNOSIS — O9962 Diseases of the digestive system complicating childbirth: Secondary | ICD-10-CM | POA: Diagnosis present

## 2014-08-04 DIAGNOSIS — O3432 Maternal care for cervical incompetence, second trimester: Principal | ICD-10-CM | POA: Diagnosis present

## 2014-08-04 DIAGNOSIS — O1092 Unspecified pre-existing hypertension complicating childbirth: Secondary | ICD-10-CM | POA: Diagnosis present

## 2014-08-04 LAB — TYPE AND SCREEN
ABO/RH(D): B POS
ANTIBODY SCREEN: NEGATIVE

## 2014-08-04 NOTE — Progress Notes (Signed)
Patient ID: Brooke [name], female   DOB: 07-14-1985, 29 y.o.   MRN: [id number] HD #25 Cervical Insufficiency 27wks 3d with Rescue cerclage  S: No complaints No bleeding. No LOF Good FM. No CP or SOB or HA Occ vaginal pain, no pressure Cough and sore throat slightly improved  O: BP 113/70 mmHg  Pulse 84  Temp(Src) 98.1 F (36.7 C) (Oral)  Resp 18  Ht 5\' 5"  (1.651 m)  Wt 102.876 kg (226 lb 12.8 oz)  BMI 37.74 kg/m2  LMP 12/12/2013 (LMP Unknown)  Tmax 98.8  WDWN WF NAD Neck: supple with FROM Lungs: CTA, No WRR CV: RRR, No MRG Abd: Gravid, NT, Fundus appropriate with dates No CVAT Pelvic: deferred Neuro: non focal Skin: intact  FHTs 140-160 Reassuring FHR with accels noted, BTBV 6-25. occ mild variable Reassuring NST x 3 No contractions noted GBS neg 3/13 Sono 3/14 c/w AGA, vtx, nl AFI, CL 0-164mm Sono 3/21 c/w 1cm CL with funneling to cerclage  CBC    Component Value Date/Time   WBC 10.1 07/14/2014 2135   RBC 4.06 07/14/2014 2135   HGB 12.8 07/14/2014 2135   HCT 37.0 07/14/2014 2135   PLT 264 07/14/2014 2135   MCV 91.1 07/14/2014 2135   MCH 31.5 07/14/2014 2135   MCHC 34.6 07/14/2014 2135   RDW 12.9 07/14/2014 2135   LYMPHSABS 2.2 07/14/2014 2135   MONOABS 1.0 07/14/2014 2135   EOSABS 0.2 07/14/2014 2135   BASOSABS 0.0 07/14/2014 2135      A: 27 wk 3d IUP Cervical Insufficiency s/p rescue cerclage, CL today pending,pt declined pessary trial Remote history of Epilepsy- no meds ? CHTN - stable BP Low Nl AFI now improved on last sono History of abnl 1gtt with nl 3gtt No s/s infection URI on Zpack  P: Vaginal prometrium qhs Bedrest with BRP BMZ completed NICU consult done Continue NST q shift Weekly sono to ck CL until 28 weeks. Continue inpatient management

## 2014-08-05 NOTE — Progress Notes (Signed)
Patient ID: [name], female   DOB: March 11, 1986, 29 y.o.   MRN: [id number] HD #26 Cervical Insufficiency 27wks 4d with Rescue cerclage  S: No complaints No bleeding. No LOF Good FM. No CP or SOB or HA Occ vaginal pain, no pressure Cough and sore throat improved  O: BP 106/73 mmHg  Pulse 90  Temp(Src) 98.4 F (36.9 C) (Oral)  Resp 20  Ht 5\' 5"  (1.651 m)  Wt 102.876 kg (226 lb 12.8 oz)  BMI 37.74 kg/m2  LMP 12/12/2013 (LMP Unknown)  Tmax 98.8  WDWN WF NAD Neck: supple with FROM Lungs: CTA, No WRR CV: RRR, No MRG Abd: Gravid, NT, Fundus appropriate with dates No CVAT Pelvic: deferred Neuro: non focal Skin: intact  FHTs 140-160 Reassuring FHR with accels noted, BTBV 6-25. occ mild variable Reassuring NST x 3 No contractions noted GBS neg 3/13 Sono 3/14 c/w AGA, vtx, nl AFI, CL 0-804mm Sono 3/29 c/w 1cm CL with funneling to cerclage  CBC    Component Value Date/Time   WBC 10.1 07/14/2014 2135   RBC 4.06 07/14/2014 2135   HGB 12.8 07/14/2014 2135   HCT 37.0 07/14/2014 2135   PLT 264 07/14/2014 2135   MCV 91.1 07/14/2014 2135   MCH 31.5 07/14/2014 2135   MCHC 34.6 07/14/2014 2135   RDW 12.9 07/14/2014 2135   LYMPHSABS 2.2 07/14/2014 2135   MONOABS 1.0 07/14/2014 2135   EOSABS 0.2 07/14/2014 2135   BASOSABS 0.0 07/14/2014 2135      A: 27 wk 4d IUP Cervical Insufficiency s/p rescue cerclage, CL 3/29,pt declined pessary trial Remote history of Epilepsy- no meds ? CHTN - stable BP History of abnl 1gtt with nl 3gtt No s/s infection URI on Zpack improving  P: 3gtt this week Vaginal prometrium qhs Bedrest with BRP BMZ completed NICU consult done Continue NST q shift Weekly sono to ck CL until 28 weeks. Continue inpatient management

## 2014-08-06 LAB — CBC WITH DIFFERENTIAL/PLATELET
Basophils Absolute: 0 10*3/uL (ref 0.0–0.1)
Basophils Relative: 0 % (ref 0–1)
EOS ABS: 0.2 10*3/uL (ref 0.0–0.7)
Eosinophils Relative: 2 % (ref 0–5)
HCT: 36.9 % (ref 36.0–46.0)
HEMOGLOBIN: 12.8 g/dL (ref 12.0–15.0)
LYMPHS ABS: 1.9 10*3/uL (ref 0.7–4.0)
Lymphocytes Relative: 25 % (ref 12–46)
MCH: 31.5 pg (ref 26.0–34.0)
MCHC: 34.7 g/dL (ref 30.0–36.0)
MCV: 90.9 fL (ref 78.0–100.0)
Monocytes Absolute: 0.3 10*3/uL (ref 0.1–1.0)
Monocytes Relative: 4 % (ref 3–12)
Neutro Abs: 5.3 10*3/uL (ref 1.7–7.7)
Neutrophils Relative %: 69 % (ref 43–77)
Platelets: 215 10*3/uL (ref 150–400)
RBC: 4.06 MIL/uL (ref 3.87–5.11)
RDW: 12.9 % (ref 11.5–15.5)
WBC: 7.7 10*3/uL (ref 4.0–10.5)

## 2014-08-06 MED ORDER — DIPHENHYDRAMINE HCL 25 MG PO CAPS
25.0000 mg | ORAL_CAPSULE | Freq: Four times a day (QID) | ORAL | Status: DC | PRN
Start: 2014-08-06 — End: 2014-09-23
  Filled 2014-08-06: qty 1

## 2014-08-06 MED ORDER — HYDROCOD POLST-CHLORPHEN POLST 10-8 MG/5ML PO LQCR
5.0000 mL | Freq: Two times a day (BID) | ORAL | Status: DC | PRN
  Administered 2014-08-06 – 2014-08-12 (×5): 5 mL via ORAL
  Filled 2014-08-06 (×5): qty 5

## 2014-08-06 NOTE — Progress Notes (Signed)
Patient ID: Brooke [name], female   DOB: 1985-12-31, 29 y.o.   MRN: [id number] HD #27 Cervical Insufficiency 27wks 5d with Rescue cerclage  S: No complaints No bleeding. No LOF Good FM. No CP or SOB or HA Occ vaginal pain, no pressure Cough persists.   O: BP 141/77 mmHg  Pulse 89  Temp(Src) 98.4 F (36.9 C) (Oral)  Resp 20  Ht 5\' 5"  (1.651 m)  Wt 102.876 kg (226 lb 12.8 oz)  BMI 37.74 kg/m2  LMP 12/12/2013 (LMP Unknown)  Tmax 98.5  WDWN WF NAD Neck: supple with FROM Lungs: CTA, No WRR CV: RRR, No MRG Abd: Gravid, NT, Fundus appropriate with dates No CVAT Pelvic: deferred Neuro: non focal Skin: intact  FHTs 140-160 Reassuring FHR with accels noted, BTBV 6-25. occ mild variable Reassuring NST x 3 No contractions noted GBS neg 3/13 Sono 3/14 c/w AGA, vtx, nl AFI, CL 0-264mm Sono 3/29 c/w 1cm CL with funneling to cerclage  CBC    Component Value Date/Time   WBC 10.1 07/14/2014 2135   RBC 4.06 07/14/2014 2135   HGB 12.8 07/14/2014 2135   HCT 37.0 07/14/2014 2135   PLT 264 07/14/2014 2135   MCV 91.1 07/14/2014 2135   MCH 31.5 07/14/2014 2135   MCHC 34.6 07/14/2014 2135   RDW 12.9 07/14/2014 2135   LYMPHSABS 2.2 07/14/2014 2135   MONOABS 1.0 07/14/2014 2135   EOSABS 0.2 07/14/2014 2135   BASOSABS 0.0 07/14/2014 2135      A: 27 wk 5d IUP Cervical Insufficiency s/p rescue cerclage, CL 3/29,pt declined pessary trial Remote history of Epilepsy- no meds ? CHTN - stable BP History of abnl 1gtt with nl 3gtt No s/s chorio URI on Zpack still with productive cough, May need CXR  P: 3gtt tomorrow CBC with diff today Add anti tussive Vaginal prometrium qhs Bedrest with BRP BMZ completed NICU consult done Continue NST q shift Weekly sono to ck CL until 28 weeks. Continue inpatient management

## 2014-08-07 LAB — GLUCOSE, 1 HOUR GESTATIONAL: GLUCOSE, 1 HOUR-GESTATIONAL: 132 mg/dL (ref 70–189)

## 2014-08-07 LAB — TYPE AND SCREEN
ABO/RH(D): B POS
ANTIBODY SCREEN: NEGATIVE

## 2014-08-07 LAB — GLUCOSE, 3 HOUR GESTATIONAL: Glucose, GTT - 3 Hour: 174 mg/dL — ABNORMAL HIGH (ref 70–144)

## 2014-08-07 LAB — GLUCOSE, FASTING: GLUCOSE, FASTING: 89 mg/dL (ref 70–99)

## 2014-08-07 LAB — GLUCOSE, 2 HOUR GESTATIONAL: Glucose Tolerance, 2 hour: 150 mg/dL (ref 70–164)

## 2014-08-07 NOTE — Progress Notes (Signed)
Patient ID: [name], female   DOB: 07-Jun-1985, 29 y.o.   MRN: [id number] HD #28 Cervical Insufficiency 27wks 6d with Rescue cerclage  S: No complaints No bleeding. No LOF Good FM. No CP or SOB or HA Occ vaginal pain, no pressure Cough persists but better today  O: BP 138/86 mmHg  Pulse 94  Temp(Src) 98.6 F (37 C) (Oral)  Resp 20  Ht 5\' 5"  (1.651 m)  Wt 103.602 kg (228 lb 6.4 oz)  BMI 38.01 kg/m2  LMP 12/12/2013 (LMP Unknown)  Tmax 98.6  WDWN WF NAD Neck: supple with FROM Lungs: CTA, No WRR CV: RRR, No MRG Abd: Gravid, NT, Fundus appropriate with dates No CVAT Pelvic: deferred Neuro: non focal Skin: intact  FHTs 140-160 Reassuring FHR with accels noted, BTBV 6-25. occ mild variable Reassuring NST x 3 No contractions noted GBS neg 3/13 Sono 3/14 c/w AGA, vtx, nl AFI, CL 0-524mm Sono 3/29 c/w 1cm CL with funneling to cerclage  CBC    Component Value Date/Time   WBC 7.7 08/06/2014 0952   RBC 4.06 08/06/2014 0952   HGB 12.8 08/06/2014 0952   HCT 36.9 08/06/2014 0952   PLT 215 08/06/2014 0952   MCV 90.9 08/06/2014 0952   MCH 31.5 08/06/2014 0952   MCHC 34.7 08/06/2014 0952   RDW 12.9 08/06/2014 0952   LYMPHSABS 1.9 08/06/2014 0952   MONOABS 0.3 08/06/2014 0952   EOSABS 0.2 08/06/2014 0952   BASOSABS 0.0 08/06/2014 0952      A: 27 wk 6d IUP Cervical Insufficiency s/p rescue cerclage, CL 3/29,pt declined pessary trial Remote history of Epilepsy- no meds ? CHTN - stable BP History of abnl 1gtt with nl 3gtt No s/s chorio URI on Zpack improved  P: 3gtt today Vaginal prometrium qhs Bedrest with BRP BMZ completed NICU consult done Continue NST q shift Weekly sono to ck CL until 28 weeks. Continue inpatient management

## 2014-08-08 NOTE — Progress Notes (Signed)
Patient ID: [name], female   DOB: 1985-09-19, 29 y.o.   MRN: [id number] HD #29 Cervical Insufficiency 28wks 0d with Rescue cerclage  S: No complaints No bleeding. No LOF Good FM. No CP or SOB or HA Occ vaginal pain, no pressure Cough persists but better today  O: BP 111/83 mmHg  Pulse 83  Temp(Src) 98.2 F (36.8 C) (Oral)  Resp 18  Ht 5\' 5"  (1.651 m)  Wt 103.602 kg (228 lb 6.4 oz)  BMI 38.01 kg/m2  LMP 12/12/2013 (LMP Unknown)  Tmax 98.6  WDWN WF NAD Neck: supple with FROM Lungs: CTA, No WRR CV: RRR, No MRG Abd: Gravid, NT, Fundus appropriate with dates No CVAT Pelvic: deferred Neuro: non focal Skin: intact  FHTs 140-160 Reassuring FHR with accels noted, BTBV 6-25. occ mild variable Reassuring NST x 3 No contractions noted GBS neg 3/13 Sono 3/14 c/w AGA, vtx, nl AFI, CL 0-634mm Sono 3/29 c/w 1cm CL with funneling to cerclage  CBC    Component Value Date/Time   WBC 7.7 08/06/2014 0952   RBC 4.06 08/06/2014 0952   HGB 12.8 08/06/2014 0952   HCT 36.9 08/06/2014 0952   PLT 215 08/06/2014 0952   MCV 90.9 08/06/2014 0952   MCH 31.5 08/06/2014 0952   MCHC 34.7 08/06/2014 0952   RDW 12.9 08/06/2014 0952   LYMPHSABS 1.9 08/06/2014 0952   MONOABS 0.3 08/06/2014 0952   EOSABS 0.2 08/06/2014 0952   BASOSABS 0.0 08/06/2014 0952      A: 28 wk 0d IUP Cervical Insufficiency s/p rescue cerclage, CL 3/29,pt declined pessary trial Remote history of Epilepsy- no meds ? CHTN - stable BP nl 3gtt 3/31 with one abnormal value No s/s chorio URI on Zpack improved  P: Vaginal prometrium qhs Bedrest with BRP BMZ completed NICU consult done Continue NST q shift Weekly sono to ck CL until 28 weeks. Continue inpatient management

## 2014-08-09 NOTE — Progress Notes (Signed)
Patient ID: Brooke [name], female   DOB: 12-25-1985, 29 y.o.   MRN: [id number] HD #30 Cervical Insufficiency 28wks 1d with Rescue cerclage  S: No complaints No bleeding. No LOF Good FM. No CP or SOB or HA Occ vaginal pain, no pressure Cough persists but better today  O: BP 137/81 mmHg  Pulse 102  Temp(Src) 98.5 F (36.9 C) (Oral)  Resp 99  Ht 5\' 5"  (1.651 m)  Wt 103.602 kg (228 lb 6.4 oz)  BMI 38.01 kg/m2  LMP 12/12/2013 (LMP Unknown)  Tmax 98.6  WDWN WF NAD Neck: supple with FROM Lungs: CTA, No WRR CV: RRR, No MRG Abd: Gravid, NT, Fundus appropriate with dates No CVAT Pelvic: deferred Neuro: non focal Skin: intact  FHTs 140-160 Reassuring FHR with accels noted, BTBV 6-25. occ mild variable Reassuring NST x 3 No contractions noted GBS neg 3/13 Sono 3/14 c/w AGA, vtx, nl AFI, CL 0-674mm Sono 3/29 c/w 1cm CL with funneling to cerclage Normal 3gtt 3/31  CBC    Component Value Date/Time   WBC 7.7 08/06/2014 0952   RBC 4.06 08/06/2014 0952   HGB 12.8 08/06/2014 0952   HCT 36.9 08/06/2014 0952   PLT 215 08/06/2014 0952   MCV 90.9 08/06/2014 0952   MCH 31.5 08/06/2014 0952   MCHC 34.7 08/06/2014 0952   RDW 12.9 08/06/2014 0952   LYMPHSABS 1.9 08/06/2014 0952   MONOABS 0.3 08/06/2014 0952   EOSABS 0.2 08/06/2014 0952   BASOSABS 0.0 08/06/2014 0952      A: 28 wk 1d IUP Cervical Insufficiency s/p rescue cerclage, CL 3/29,pt declined pessary trial Remote history of Epilepsy- no meds ? CHTN - stable BP No s/s chorio URI on Zpack improved  P: Vaginal prometrium qhs Bedrest with BRP BMZ completed NICU consult done Continue NST qd Weekly sono to ck CL until 28 weeks. Continue inpatient management

## 2014-08-09 NOTE — Progress Notes (Signed)
Pt off the monitor after reasurring FHR  

## 2014-08-10 NOTE — Progress Notes (Signed)
Patient ID: [name], female   DOB: December 02, 1985, 29 y.o.   MRN: [id number] HD #31 Cervical Insufficiency 28wks 2d with Rescue cerclage  S: No complaints No bleeding. No LOF Good FM. No CP or SOB or HA Occ vaginal pain, no pressure Cough persists but better today  O: BP 135/77 mmHg  Pulse 90  Temp(Src) 97.8 F (36.6 C) (Oral)  Resp 20  Ht 5\' 5"  (1.651 m)  Wt 103.602 kg (228 lb 6.4 oz)  BMI 38.01 kg/m2  LMP 12/12/2013 (LMP Unknown)  Tmax 98.6  WDWN WF NAD Neck: supple with FROM Lungs: CTA, No WRR CV: RRR, No MRG Abd: Gravid, NT, Fundus appropriate with dates No CVAT Pelvic: deferred Neuro: non focal Skin: intact  FHTs 140-160 Reassuring FHR with accels noted, BTBV 6-25. occ mild variable Reactive NST No contractions noted GBS neg 3/13 Sono 3/14 c/w AGA, vtx, nl AFI, CL 0-494mm Sono 3/29 c/w 1cm CL with funneling to cerclage Normal 3gtt 3/31  CBC    Component Value Date/Time   WBC 7.7 08/06/2014 0952   RBC 4.06 08/06/2014 0952   HGB 12.8 08/06/2014 0952   HCT 36.9 08/06/2014 0952   PLT 215 08/06/2014 0952   MCV 90.9 08/06/2014 0952   MCH 31.5 08/06/2014 0952   MCHC 34.7 08/06/2014 0952   RDW 12.9 08/06/2014 0952   LYMPHSABS 1.9 08/06/2014 0952   MONOABS 0.3 08/06/2014 0952   EOSABS 0.2 08/06/2014 0952   BASOSABS 0.0 08/06/2014 0952      A: 28 wk 2d IUP Cervical Insufficiency s/p rescue cerclage, CL 3/29,pt declined pessary trial Remote history of Epilepsy- no meds ? CHTN - stable BP No s/s chorio URI resolved  P: Vaginal prometrium qhs Bedrest with BRP BMZ completed NICU consult done Continue NST qd Weekly sono to ck CL until 28 weeks. Add growth sono to next CL sono Continue inpatient management

## 2014-08-11 ENCOUNTER — Inpatient Hospital Stay (HOSPITAL_COMMUNITY): Payer: 59

## 2014-08-11 DIAGNOSIS — Z3A28 28 weeks gestation of pregnancy: Secondary | ICD-10-CM | POA: Insufficient documentation

## 2014-08-11 NOTE — Progress Notes (Signed)
Dr. Billy Coastaavon informed of pt U/S report.

## 2014-08-11 NOTE — Progress Notes (Signed)
Ur chart review completed.  

## 2014-08-11 NOTE — Progress Notes (Signed)
Patient ID: Brooke [name], female   DOB: [birth year], 29 y.o.   MRN: [id number] HD #32 Cervical Insufficiency 28wks 3d with Rescue cerclage  S: No complaints No bleeding. No LOF Good FM. No CP or SOB or HA Occ vaginal pain, no pressure Cough persists but better today  O: BP 140/90 mmHg  Pulse 103  Temp(Src) 98.4 F (36.9 C) (Oral)  Resp 20  Ht 5\' 5"  (1.651 m)  Wt 103.602 kg (228 lb 6.4 oz)  BMI 38.01 kg/m2  LMP 12/12/2013 (LMP Unknown)  Tmax 98.8  WDWN WF NAD Neck: supple with FROM Lungs: CTA, No WRR CV: RRR, No MRG Abd: Gravid, NT, Fundus appropriate with dates No CVAT Pelvic: deferred Neuro: non focal Skin: intact  FHTs 140-160 Reassuring FHR with accels noted, BTBV 6-25. occ mild variable Reactive NST No contractions noted GBS neg 3/13 Sono 3/14 c/w AGA, vtx, nl AFI, CL 0-504mm Sono 3/29 c/w 1cm CL with funneling to cerclage Normal 3gtt 3/31  CBC    Component Value Date/Time   WBC 7.7 08/06/2014 0952   RBC 4.06 08/06/2014 0952   HGB 12.8 08/06/2014 0952   HCT 36.9 08/06/2014 0952   PLT 215 08/06/2014 0952   MCV 90.9 08/06/2014 0952   MCH 31.5 08/06/2014 0952   MCHC 34.7 08/06/2014 0952   RDW 12.9 08/06/2014 0952   LYMPHSABS 1.9 08/06/2014 0952   MONOABS 0.3 08/06/2014 0952   EOSABS 0.2 08/06/2014 0952   BASOSABS 0.0 08/06/2014 0952      A: 28 wk 3d IUP Cervical Insufficiency s/p rescue cerclage, CL 3/29,pt declined pessary trial Remote history of Epilepsy- no meds ? CHTN - stable BP No s/s chorio URI resolved  P: Vaginal prometrium qhs Bedrest with BRP BMZ completed NICU consult done Continue NST qd Growth and CL sono pending today Continue inpatient management

## 2014-08-12 NOTE — Progress Notes (Signed)
Patient c/o mild, dull constant discomfort along upper abdomen/lower rib cage in the area of the thoracic diaphragm. Patient states she was experiencing more discomfort about an hour ago. At that time she would have rated the pain at 5 on 0-10 scale. Now she rates it at 2. Patient denies that coughing, deep inhalation or any change in position decreases or increases the level of discomfort. No uterine activity detected with toco or palpation.

## 2014-08-12 NOTE — Progress Notes (Signed)
Patient ID: Brooke [name], female   DOB: Jan 17, 1986, 29 y.o.   MRN: [id number] HD #33 Cervical Insufficiency 28wks 4d with Rescue cerclage  S: No complaints No bleeding. No LOF Good FM. No CP or SOB or HA Occ vaginal pain, no pressure Cough resolved. Mild uterine tenderness reported.  O: BP 140/98 mmHg  Pulse 108  Temp(Src) 98.6 F (37 C) (Oral)  Resp 18  Ht 5\' 5"  (1.651 m)  Wt 103.602 kg (228 lb 6.4 oz)  BMI 38.01 kg/m2  SpO2 96%  LMP 12/12/2013 (LMP Unknown)  Tmax 98.6  WDWN WF NAD Neck: supple with FROM Lungs: CTA, No WRR CV: RRR, No MRG Abd: Gravid, NT, Fundus appropriate with dates, No CVAT Pelvic: deferred Neuro: non focal Skin: intact  FHTs 140-160 Reassuring FHR with accels noted, BTBV 6-25. occ mild variable Reactive NST No contractions noted GBS neg 3/13 Sono 4/4 c/w AGA, vtx, nl AFI, CL 0-584mm Sono 4/4 c/w 2cm CL with funneling to cerclage Normal 3gtt 3/31  CBC    Component Value Date/Time   WBC 7.7 08/06/2014 0952   RBC 4.06 08/06/2014 0952   HGB 12.8 08/06/2014 0952   HCT 36.9 08/06/2014 0952   PLT 215 08/06/2014 0952   MCV 90.9 08/06/2014 0952   MCH 31.5 08/06/2014 0952   MCHC 34.7 08/06/2014 0952   RDW 12.9 08/06/2014 0952   LYMPHSABS 1.9 08/06/2014 0952   MONOABS 0.3 08/06/2014 0952   EOSABS 0.2 08/06/2014 0952   BASOSABS 0.0 08/06/2014 0952      A: 28 wk 4d IUP with AGA, low normal AFI Cervical Insufficiency s/p rescue cerclage, CL 3/29,pt declined pessary trial Remote history of Epilepsy- no meds ? CHTN - stable BP, no s/s PEC No s/s chorio URI resolved  P: Vaginal prometrium qhs Bedrest with BRP BMZ completed NICU consult done Continue NST qd Continue inpatient management

## 2014-08-12 NOTE — Progress Notes (Addendum)
Patient c/o abdominal pain at 5 on 0-10 scale. Request to be placed on toco. While applying toco, patient is noticeably tender over her fundus.  Localized tenderness due to fetal positioning noted. No change in dc. Afebrile. Continue to monitor. Doubt Chorio.

## 2014-08-12 NOTE — Progress Notes (Addendum)
Dr. Billy Coastaavon notified of pt's reported abdominal pain and tenderness to palpation over fundal area while applying toco and that monitor reveals no uterine activity and none is palpated. Request CBC order for a.m., along with T&S draw. Aware patient is afebrile. Declines order. No fever or chills or change in discharge. Pt tenderness is localized to mid fundus c/w discomfort due to fetal position. No contractions on Toco. Doubt chorio. Will continue to monitor.

## 2014-08-13 LAB — TYPE AND SCREEN
ABO/RH(D): B POS
ABO/RH(D): B POS
ANTIBODY SCREEN: NEGATIVE
Antibody Screen: NEGATIVE

## 2014-08-13 NOTE — Progress Notes (Signed)
Patient ID: [name], female   DOB: 19-Feb-1986, 29 y.o.   MRN: [id number] HD #34 Cervical Insufficiency 28wks 5d with Rescue cerclage  S: No complaints No bleeding. No LOF Good FM. No CP or SOB or HA Occ vaginal pain, no pressure Cough resolved. Mild uterine tenderness reported.  O: BP 125/88 mmHg  Pulse 105  Temp(Src) 98.5 F (36.9 C) (Oral)  Resp 18  Ht 5\' 5"  (1.651 m)  Wt 103.828 kg (228 lb 14.4 oz)  BMI 38.09 kg/m2  SpO2 96%  LMP 12/12/2013 (LMP Unknown)  Tmax 98.9  WDWN WF NAD Neck: supple with FROM Lungs: CTA, No WRR CV: RRR, No MRG Abd: Gravid, NT, Fundus appropriate with dates, No CVAT Pelvic: deferred Neuro: non focal Skin: intact  FHTs 140-160 Reassuring FHR with accels noted, BTBV 6-25. occ mild variable Reactive NST No contractions noted GBS neg 3/13 Sono 4/4 c/w AGA, vtx, nl AFI Sono 4/4 c/w 2cm CL with funneling to cerclage Normal 3gtt 3/31  CBC    Component Value Date/Time   WBC 7.7 08/06/2014 0952   RBC 4.06 08/06/2014 0952   HGB 12.8 08/06/2014 0952   HCT 36.9 08/06/2014 0952   PLT 215 08/06/2014 0952   MCV 90.9 08/06/2014 0952   MCH 31.5 08/06/2014 0952   MCHC 34.7 08/06/2014 0952   RDW 12.9 08/06/2014 0952   LYMPHSABS 1.9 08/06/2014 0952   MONOABS 0.3 08/06/2014 0952   EOSABS 0.2 08/06/2014 0952   BASOSABS 0.0 08/06/2014 0952      A: 28 wk 5d IUP with AGA, low normal AFI Cervical Insufficiency s/p rescue cerclage, CL 3/29,pt declined pessary trial Remote history of Epilepsy- no meds ? CHTN - stable BP, no s/s PEC No s/s chorio URI resolved  P: Vaginal prometrium qhs Bedrest with BRP BMZ completed NICU consult done Continue NST qd Continue inpatient management

## 2014-08-14 LAB — URINALYSIS, ROUTINE W REFLEX MICROSCOPIC
BILIRUBIN URINE: NEGATIVE
Hgb urine dipstick: NEGATIVE
Ketones, ur: 15 mg/dL — AB
Leukocytes, UA: NEGATIVE
Nitrite: NEGATIVE
PH: 6 (ref 5.0–8.0)
Protein, ur: NEGATIVE mg/dL
Urobilinogen, UA: 0.2 mg/dL (ref 0.0–1.0)

## 2014-08-14 LAB — URINE MICROSCOPIC-ADD ON

## 2014-08-14 NOTE — Progress Notes (Signed)
Patient ID: Ryelynn L Riggins-BogueLindajo Royal, female   DOB: December 31, 1985, 29 y.o.   MRN: [id number] HD #35 Cervical Insufficiency 28wks 6d with Rescue cerclage  S: No complaints No bleeding. No LOF Good FM. No CP or SOB or HA Occ vaginal pain, no pressure Cough resolved. Mild uterine tenderness reported.  O: BP 111/73 mmHg  Pulse 91  Temp(Src) 98.5 F (36.9 C) (Oral)  Resp 18  Ht 5\' 5"  (1.651 m)  Wt 103.828 kg (228 lb 14.4 oz)  BMI 38.09 kg/m2  SpO2 96%  LMP 12/12/2013 (LMP Unknown)  Tmax 98.9  WDWN WF NAD Neck: supple with FROM Lungs: CTA, No WRR CV: RRR, No MRG Abd: Gravid, NT, Fundus appropriate with dates, No CVAT Pelvic: deferred Neuro: non focal Skin: intact  FHTs 140-160 Reassuring FHR with accels noted, BTBV 6-25. occ mild variable Reactive NST No contractions noted GBS neg 3/13 Sono 4/4 c/w AGA, vtx, nl AFI Sono 4/4 c/w 2cm CL with funneling to cerclage Normal 3gtt 3/31  CBC    Component Value Date/Time   WBC 7.7 08/06/2014 0952   RBC 4.06 08/06/2014 0952   HGB 12.8 08/06/2014 0952   HCT 36.9 08/06/2014 0952   PLT 215 08/06/2014 0952   MCV 90.9 08/06/2014 0952   MCH 31.5 08/06/2014 0952   MCHC 34.7 08/06/2014 0952   RDW 12.9 08/06/2014 0952   LYMPHSABS 1.9 08/06/2014 0952   MONOABS 0.3 08/06/2014 0952   EOSABS 0.2 08/06/2014 0952   BASOSABS 0.0 08/06/2014 0952      A: 28 wk 6d IUP with AGA, low normal AFI Cervical Insufficiency s/p rescue cerclage, CL 3/29,pt declined pessary trial Remote history of Epilepsy- no meds ? CHTN - stable BP, no s/s PEC No s/s chorio URI resolved  P: Vaginal prometrium qhs Bedrest with BRP BMZ completed NICU consult done Continue NST qd Continue inpatient management

## 2014-08-15 NOTE — Progress Notes (Signed)
Patient ID: [name], female   DOB: 1986/03/06, 29 y.o.   MRN: [id number] HD #36 Cervical Insufficiency 29wks 0d with Rescue cerclage  S: No complaints No bleeding. No LOF Good FM. No CP or SOB or HA Occ vaginal pain, no pressure Cough resolved. Mild uterine tenderness reported.  O: BP 139/83 mmHg  Pulse 108  Temp(Src) 98.8 F (37.1 C) (Oral)  Resp 20  Ht 5\' 5"  (1.651 m)  Wt 103.828 kg (228 lb 14.4 oz)  BMI 38.09 kg/m2  SpO2 96%  LMP 12/12/2013 (LMP Unknown)  Tmax 98.8  WDWN WF NAD Neck: supple with FROM Lungs: CTA, No WRR CV: RRR, No MRG Abd: Gravid, NT, Fundus appropriate with dates, No CVAT Pelvic: deferred Neuro: non focal Skin: intact  FHTs 140-160 Reassuring FHR with accels noted, BTBV 6-25. occ mild variable Reactive NST No contractions noted GBS neg 3/13 Sono 4/4 c/w AGA, vtx, nl AFI Sono 4/4 c/w 2cm CL with funneling to cerclage Normal 3gtt 3/31  CBC    Component Value Date/Time   WBC 7.7 08/06/2014 0952   RBC 4.06 08/06/2014 0952   HGB 12.8 08/06/2014 0952   HCT 36.9 08/06/2014 0952   PLT 215 08/06/2014 0952   MCV 90.9 08/06/2014 0952   MCH 31.5 08/06/2014 0952   MCHC 34.7 08/06/2014 0952   RDW 12.9 08/06/2014 0952   LYMPHSABS 1.9 08/06/2014 0952   MONOABS 0.3 08/06/2014 0952   EOSABS 0.2 08/06/2014 0952   BASOSABS 0.0 08/06/2014 0952      A: 29 wk 0d IUP with AGA, low normal AFI Cervical Insufficiency s/p rescue cerclage, CL 3/29,pt declined pessary trial Remote history of Epilepsy- no meds ? CHTN - stable BP, no s/s PEC No s/s chorio URI resolved  P: Vaginal prometrium qhs Bedrest with BRP BMZ completed NICU consult done Continue NST qd Continue inpatient management

## 2014-08-16 LAB — CULTURE, OB URINE
Colony Count: NO GROWTH
Culture: NO GROWTH
Special Requests: NORMAL

## 2014-08-16 LAB — TYPE AND SCREEN
ABO/RH(D): B POS
Antibody Screen: NEGATIVE

## 2014-08-16 LAB — AMNISURE RUPTURE OF MEMBRANE (ROM) NOT AT ARMC: Amnisure ROM: NEGATIVE

## 2014-08-16 NOTE — Progress Notes (Signed)
[facility]

## 2014-08-16 NOTE — Progress Notes (Addendum)
Pt c/o leaking fluid each time she coughs, denies continuous leaking. No continuous leakage. Amnisure neg.

## 2014-08-16 NOTE — Progress Notes (Signed)
Patient ID: Brooke [name], female   DOB: Dec 30, 1985, 29 y.o.   MRN: [id number] HD #37 Cervical Insufficiency 29wks 1d with Rescue cerclage  S: No complaints No bleeding.  ? LOF with cough- Amnisure negative Good FM. No CP or SOB or HA Occ vaginal pain, no pressure Cough resolved. Mild uterine tenderness resolved  O: BP 129/85 mmHg  Pulse 103  Temp(Src) 98.4 F (36.9 C) (Oral)  Resp 20  Ht 5\' 5"  (1.651 m)  Wt 103.828 kg (228 lb 14.4 oz)  BMI 38.09 kg/m2  SpO2 96%  LMP 12/12/2013 (LMP Unknown)  Tmax 98.8  WDWN WF NAD Neck: supple with FROM Lungs: CTA, No WRR CV: RRR, No MRG Abd: Gravid, NT, Fundus appropriate with dates, No CVAT Pelvic: deferred Neuro: non focal Skin: intact  FHTs 140-160 Reactive FHR with accels noted, BTBV 6-25- category 1 Reactive NST No contractions noted GBS neg 3/13 Sono 4/4 c/w AGA, vtx, nl AFI Sono 4/4 c/w 2cm CL with funneling to cerclage Normal 3gtt 3/31 Amnisure negative  CBC    Component Value Date/Time   WBC 7.7 08/06/2014 0952   RBC 4.06 08/06/2014 0952   HGB 12.8 08/06/2014 0952   HCT 36.9 08/06/2014 0952   PLT 215 08/06/2014 0952   MCV 90.9 08/06/2014 0952   MCH 31.5 08/06/2014 0952   MCHC 34.7 08/06/2014 0952   RDW 12.9 08/06/2014 0952   LYMPHSABS 1.9 08/06/2014 0952   MONOABS 0.3 08/06/2014 0952   EOSABS 0.2 08/06/2014 0952   BASOSABS 0.0 08/06/2014 0952      A: 29 wk 0d IUP with AGA, low normal AFI ?LOF today- Amnisure negative. Cervical Insufficiency s/p rescue cerclage, CL 3/29,pt declined pessary trial Remote history of Epilepsy- no meds ? CHTN - stable BP, no s/s PEC No s/s chorio URI resolved  P: Vaginal prometrium qhs Bedrest with BRP BMZ completed NICU consult done Continue NST qd Continue inpatient management

## 2014-08-17 NOTE — Progress Notes (Signed)
Patient ID: Brooke [name], female   DOB: 02-02-1986, 29 y.o.   MRN: [id number] HD #38 Cervical Insufficiency 29wks 2d with Rescue cerclage  S: No complaints No bleeding.  No LOF Good FM. No CP or SOB or HA Occ vaginal pressure, no pain  O: BP 142/98 mmHg  Pulse 98  Temp(Src) 98.4 F (36.9 C) (Oral)  Resp 18  Ht 5\' 5"  (1.651 m)  Wt 103.828 kg (228 lb 14.4 oz)  BMI 38.09 kg/m2  SpO2 96%  LMP 12/12/2013 (LMP Unknown)  Tmax 98.4  WDWN WF NAD Neck: supple with FROM Lungs: CTA, No WRR CV: RRR, No MRG Abd: Gravid, NT, Fundus appropriate with dates, No CVAT Pelvic: deferred Neuro: non focal Skin: intact  FHTs 140-160 Reactive FHR with accels noted, BTBV 6-25- category 1 Reactive NST No contractions noted GBS neg 3/13 Sono 4/4 c/w AGA, vtx, nl AFI Sono 4/4 c/w 2cm CL with funneling to cerclage Normal 3gtt 3/31 Amnisure negative 4/9  CBC    Component Value Date/Time   WBC 7.7 08/06/2014 0952   RBC 4.06 08/06/2014 0952   HGB 12.8 08/06/2014 0952   HCT 36.9 08/06/2014 0952   PLT 215 08/06/2014 0952   MCV 90.9 08/06/2014 0952   MCH 31.5 08/06/2014 0952   MCHC 34.7 08/06/2014 0952   RDW 12.9 08/06/2014 0952   LYMPHSABS 1.9 08/06/2014 0952   MONOABS 0.3 08/06/2014 0952   EOSABS 0.2 08/06/2014 0952   BASOSABS 0.0 08/06/2014 0952      A: 29 wk 2d IUP with AGA, low normal AFI Cervical Insufficiency s/p rescue cerclage, CL 3/29,pt declined pessary trial Remote history of Epilepsy- no meds ? CHTN - stable BP, no s/s PEC No s/s chorio  P: Vaginal prometrium qhs Bedrest with BRP BMZ completed NICU consult done Continue NST qd Continue inpatient management

## 2014-08-18 ENCOUNTER — Inpatient Hospital Stay (HOSPITAL_COMMUNITY): Payer: 59

## 2014-08-18 DIAGNOSIS — Z3A29 29 weeks gestation of pregnancy: Secondary | ICD-10-CM | POA: Insufficient documentation

## 2014-08-18 LAB — AMNISURE RUPTURE OF MEMBRANE (ROM) NOT AT ARMC: AMNISURE: POSITIVE

## 2014-08-18 MED ORDER — AZITHROMYCIN 1 G PO PACK
1.0000 g | PACK | Freq: Once | ORAL | Status: AC
Start: 2014-08-18 — End: 2014-08-18
  Administered 2014-08-18: 1 g via ORAL
  Filled 2014-08-18: qty 1

## 2014-08-18 MED ORDER — AMPICILLIN SODIUM 2 G IJ SOLR
2.0000 g | Freq: Four times a day (QID) | INTRAMUSCULAR | Status: AC
  Administered 2014-08-18 – 2014-08-20 (×8): 2 g via INTRAVENOUS
  Filled 2014-08-18 (×8): qty 2000

## 2014-08-18 MED ORDER — AMOXICILLIN 500 MG PO CAPS
500.0000 mg | ORAL_CAPSULE | Freq: Three times a day (TID) | ORAL | Status: AC
  Administered 2014-08-20 – 2014-08-25 (×15): 500 mg via ORAL
  Filled 2014-08-18 (×18): qty 1

## 2014-08-18 NOTE — Progress Notes (Signed)
Ur chart review completed.  

## 2014-08-18 NOTE — Progress Notes (Signed)
Patient ID: Brooke [name], female   DOB: 1985/10/03, 29 y.o.   MRN: [id number] No further LOF noted BP 127/82 mmHg  Pulse 97  Temp(Src) 97.7 F (36.5 C) (Oral)  Resp 18  Ht 5\' 5"  (1.651 m)  Wt 103.828 kg (228 lb 14.4 oz)  BMI 38.09 kg/m2  SpO2 96%  LMP 12/12/2013 (LMP Unknown)  AFI today 11.3 improved from 10.8 Pelvic exam negative Amnisure positive Will administer PPROM ABX protocol

## 2014-08-18 NOTE — Progress Notes (Signed)
Patient ID: Brooke [name], female   DOB: [birth year], 29 y.o.   MRN: [id number] HD #39 Cervical Insufficiency 29wks 3d with Rescue cerclage  S: No complaints No bleeding.  ? LOF noted Good FM. No CP or SOB or HA Occ vaginal pressure, no pain  O: BP 127/82 mmHg  Pulse 97  Temp(Src) 97.7 F (36.5 C) (Oral)  Resp 18  Ht 5\' 5"  (1.651 m)  Wt 103.828 kg (228 lb 14.4 oz)  BMI 38.09 kg/m2  SpO2 96%  LMP 12/12/2013 (LMP Unknown)  Tmax 98.4  WDWN WF NAD Neck: supple with FROM Lungs: CTA, No WRR CV: RRR, No MRG Abd: Gravid, NT, Fundus appropriate with dates, No CVAT Pelvic: FT/funneled/ Cerclage intact 360 degrees, No pooling , no fluid leakage noted with valsalva Neuro: non focal Skin: intact  FHTs 140-160 Reactive FHR with accels noted, BTBV 6-25- category 1 Reactive NST No contractions noted GBS neg 3/13 Sono 4/4 c/w AGA, vtx, nl AFI Sono 4/4 c/w 2cm CL with funneling to cerclage Normal 3gtt 3/31 Amnisure negative 4/9  CBC    Component Value Date/Time   WBC 7.7 08/06/2014 0952   RBC 4.06 08/06/2014 0952   HGB 12.8 08/06/2014 0952   HCT 36.9 08/06/2014 0952   PLT 215 08/06/2014 0952   MCV 90.9 08/06/2014 0952   MCH 31.5 08/06/2014 0952   MCHC 34.7 08/06/2014 0952   RDW 12.9 08/06/2014 0952   LYMPHSABS 1.9 08/06/2014 0952   MONOABS 0.3 08/06/2014 0952   EOSABS 0.2 08/06/2014 0952   BASOSABS 0.0 08/06/2014 0952      A: 29 wk 3d IUP with AGA, low normal AFI ? LOF - exam negative Cervical Insufficiency s/p rescue cerclage, CL 3/29,pt declined pessary trial- cerclage intact Remote history of Epilepsy- no meds ? CHTN - stable BP, no s/s PEC No s/s chorio  P: Will do Amnisure AFI tomorrow Vaginal prometrium qhs Bedrest with BRP BMZ completed NICU consult done Continue NST qd Continue inpatient management

## 2014-08-18 NOTE — Progress Notes (Signed)
Patient ID: Brooke [name], female   DOB: 1985-05-27, 29 y.o.   MRN: [id number] Pt had noted ? LOF Neg pelvic exam, no pooling, neg LOF on valsalva Amnisure positve- ? False positive Will ck AFI.

## 2014-08-19 LAB — CBC WITH DIFFERENTIAL/PLATELET
BASOS ABS: 0 10*3/uL (ref 0.0–0.1)
Basophils Relative: 0 % (ref 0–1)
EOS PCT: 1 % (ref 0–5)
Eosinophils Absolute: 0.1 10*3/uL (ref 0.0–0.7)
HCT: 36.5 % (ref 36.0–46.0)
HEMOGLOBIN: 12.6 g/dL (ref 12.0–15.0)
LYMPHS ABS: 1.7 10*3/uL (ref 0.7–4.0)
LYMPHS PCT: 23 % (ref 12–46)
MCH: 30.9 pg (ref 26.0–34.0)
MCHC: 34.5 g/dL (ref 30.0–36.0)
MCV: 89.5 fL (ref 78.0–100.0)
MONOS PCT: 6 % (ref 3–12)
Monocytes Absolute: 0.5 10*3/uL (ref 0.1–1.0)
Neutro Abs: 5.3 10*3/uL (ref 1.7–7.7)
Neutrophils Relative %: 70 % (ref 43–77)
PLATELETS: 216 10*3/uL (ref 150–400)
RBC: 4.08 MIL/uL (ref 3.87–5.11)
RDW: 13.1 % (ref 11.5–15.5)
WBC: 7.5 10*3/uL (ref 4.0–10.5)

## 2014-08-19 LAB — WET PREP, GENITAL
Clue Cells Wet Prep HPF POC: NONE SEEN
Trich, Wet Prep: NONE SEEN
Yeast Wet Prep HPF POC: NONE SEEN

## 2014-08-19 LAB — TYPE AND SCREEN
ABO/RH(D): B POS
Antibody Screen: NEGATIVE

## 2014-08-19 LAB — POCT FERN TEST: POCT Fern Test: NEGATIVE

## 2014-08-19 MED ORDER — BUTALBITAL-APAP-CAFFEINE 50-325-40 MG PO TABS
2.0000 | ORAL_TABLET | Freq: Once | ORAL | Status: AC
  Administered 2014-08-19: 2 via ORAL
  Filled 2014-08-19: qty 2

## 2014-08-19 NOTE — Progress Notes (Signed)
Patient ID: Brooke [name], female   DOB: 08-Jun-1985, 29 y.o.   MRN: [id number] HD #40 Cervical Insufficiency 29wks 4d with Rescue cerclage  S: No complaints No bleeding.  ? LOF noted , ? Vaginal odor Good FM. No CP or SOB or HA Occ vaginal pressure, no pain  O: BP 127/83 mmHg  Pulse 100  Temp(Src) 98.4 F (36.9 C) (Oral)  Resp 18  Ht 5\' 5"  (1.651 m)  Wt 103.828 kg (228 lb 14.4 oz)  BMI 38.09 kg/m2  SpO2 96%  LMP 12/12/2013 (LMP Unknown)  Tmax 98.4 Pm100  WDWN WF NAD Neck: supple with FROM Lungs: CTA, No WRR CV: RRR, No MRG Abd: Gravid, NT, Fundus appropriate with dates, No CVAT Pelvic: deferred Neuro: non focal Skin: intact  FHTs 140-160 Reactive FHR with accels noted, BTBV 6-25- category 1 Reactive NST No contractions noted GBS neg 3/13 Sono 4/4 c/w AGA, vtx, nl AFI- Nl AFI (slightly improved 4/11 after pos amnisure noted) Sono 4/4 c/w 2cm CL with funneling to cerclage Normal 3gtt 3/31 Amnisure negative 4/9, positve 4/11  CBC    Component Value Date/Time   WBC 7.7 08/06/2014 0952   RBC 4.06 08/06/2014 0952   HGB 12.8 08/06/2014 0952   HCT 36.9 08/06/2014 0952   PLT 215 08/06/2014 0952   MCV 90.9 08/06/2014 0952   MCH 31.5 08/06/2014 0952   MCHC 34.7 08/06/2014 0952   RDW 12.9 08/06/2014 0952   LYMPHSABS 1.9 08/06/2014 0952   MONOABS 0.3 08/06/2014 0952   EOSABS 0.2 08/06/2014 0952   BASOSABS 0.0 08/06/2014 0952      A: 29 wk 4d IUP with AGA, low normal AFI ? LOF - exam negative, Amnisure positive, AFI stable to improved Cervical Insufficiency s/p rescue cerclage, CL 3/29,pt declined pessary trial- cerclage intact Remote history of Epilepsy- no meds ? CHTN - stable BP, no s/s PEC No s/s chorio Vaginal odor  P: Wet prep CBC Vaginal prometrium qhs Bedrest with BRP BMZ completed NICU consult done Continue NST qd Continue inpatient management

## 2014-08-19 NOTE — Progress Notes (Signed)
Patient ID: Brooke [name], female   DOB: 1985-10-27, 29 y.o.   MRN: [id number] No LOF today. Wet prep neg Fern negative BP 118/79 mmHg  Pulse 90  Temp(Src) 98.3 F (36.8 C) (Oral)  Resp 18  Ht 5\' 5"  (1.651 m)  Wt 103.828 kg (228 lb 14.4 oz)  BMI 38.09 kg/m2  SpO2 96%  LMP 12/12/2013 (LMP Unknown)  CBC    Component Value Date/Time   WBC 7.5 08/19/2014 0935   RBC 4.08 08/19/2014 0935   HGB 12.6 08/19/2014 0935   HCT 36.5 08/19/2014 0935   PLT 216 08/19/2014 0935   MCV 89.5 08/19/2014 0935   MCH 30.9 08/19/2014 0935   MCHC 34.5 08/19/2014 0935   RDW 13.1 08/19/2014 0935   LYMPHSABS 1.7 08/19/2014 0935   MONOABS 0.5 08/19/2014 0935   EOSABS 0.1 08/19/2014 0935   BASOSABS 0.0 08/19/2014 0935    Doubt SROM WIll rpt Amnisure tomorrow

## 2014-08-20 LAB — AMNISURE RUPTURE OF MEMBRANE (ROM) NOT AT ARMC: Amnisure ROM: POSITIVE

## 2014-08-20 NOTE — Progress Notes (Signed)
Patient ID: Brooke [name], female   DOB: 01/06/1986, 29 y.o.   MRN: [id number] Amisure rpt positive Although no other evidence of PPROM with nl afi, neg fern and nitrazine and neg exam Will continue as inpatient. Deliver and remove cerclage at 34 weeks. No tocolysis if labors

## 2014-08-20 NOTE — Progress Notes (Signed)
Patient ID: Brooke [name], female   DOB: May 16, 1985, 29 y.o.   MRN: [id number] HD #41 Cervical Insufficiency 29wks 4d with Rescue cerclage  S: No complaints No bleeding.  ? LOF noted , ? Vaginal odor Good FM. No CP or SOB or HA Occ vaginal pressure, no pain  O: BP 121/77 mmHg  Pulse 93  Temp(Src) 98.4 F (36.9 C) (Oral)  Resp 18  Ht 5\' 5"  (1.651 m)  Wt 103.828 kg (228 lb 14.4 oz)  BMI 38.09 kg/m2  SpO2 96%  LMP 12/12/2013 (LMP Unknown)  Tmax 98.4 Pm100  WDWN WF NAD Neck: supple with FROM Lungs: CTA, No WRR CV: RRR, No MRG Abd: Gravid, NT, Fundus appropriate with dates, No CVAT Pelvic: deferred Neuro: non focal Skin: intact  FHTs 140-160 Reactive FHR with accels noted, BTBV 6-25- category 1 Reactive NST No contractions noted GBS neg 3/13 Sono 4/4 c/w AGA, vtx, nl AFI- Nl AFI (slightly improved 4/11 after pos amnisure noted) Sono 4/4 c/w 2cm CL with funneling to cerclage Normal 3gtt 3/31 Amnisure negative 4/9, positve 4/11  CBC    Component Value Date/Time   WBC 7.5 08/19/2014 0935   RBC 4.08 08/19/2014 0935   HGB 12.6 08/19/2014 0935   HCT 36.5 08/19/2014 0935   PLT 216 08/19/2014 0935   MCV 89.5 08/19/2014 0935   MCH 30.9 08/19/2014 0935   MCHC 34.5 08/19/2014 0935   RDW 13.1 08/19/2014 0935   LYMPHSABS 1.7 08/19/2014 0935   MONOABS 0.5 08/19/2014 0935   EOSABS 0.1 08/19/2014 0935   BASOSABS 0.0 08/19/2014 0935      A: 29 wk 4d IUP with AGA, low normal but stable AFI ? LOF - exam negative, Amnisure positive, AFI stable to improved, Fern and nitrazine neg Cervical Insufficiency s/p rescue cerclage, CL 3/29,pt declined pessary trial- cerclage intact Remote history of Epilepsy- no meds ? CHTN - stable BP, no s/s PEC No s/s chorio Vaginal odor- neg wet prep  P: Repeat Amnisure today Vaginal prometrium qhs Bedrest with BRP BMZ completed NICU consult done Continue NST qd Continue inpatient management

## 2014-08-21 MED ORDER — HYDROCOD POLST-CPM POLST ER 10-8 MG/5ML PO SUER: 5.0000 mL | Freq: Two times a day (BID) | ORAL | Status: DC | PRN

## 2014-08-21 MED ORDER — HYDROXYPROGESTERONE CAPROATE 250 MG/ML IM OIL
250.0000 mg | TOPICAL_OIL | INTRAMUSCULAR | Status: DC
  Administered 2014-08-21: 250 mg via INTRAMUSCULAR
  Filled 2014-08-21 (×5): qty 1

## 2014-08-21 MED ORDER — BETAMETHASONE SOD PHOS & ACET 6 (3-3) MG/ML IJ SUSP
12.0000 mg | Freq: Once | INTRAMUSCULAR | Status: AC
  Administered 2014-08-21: 12 mg via INTRAMUSCULAR
  Filled 2014-08-21: qty 2

## 2014-08-21 NOTE — Progress Notes (Signed)
Patient states the feeling in her vagina is milder now but she hasn't been leaking. Patient is agreeable to fetal monitoring while we are awaiting Dr. Seymour BarsLavoie.

## 2014-08-21 NOTE — Progress Notes (Signed)
Pt. States she feels like something is in her vagina, the feeling is worse when more fluid comes out. Pt. Denies cramping or tightening. Nothing is present to visual inspection. Dr. Seymour BarsLavoie notified via phone. She is starting a case in OR, requested that she come to [facility] when she is finished.

## 2014-08-21 NOTE — Progress Notes (Signed)
Ur chart review completed.  

## 2014-08-21 NOTE — Progress Notes (Signed)
Patient ID: Brooke [name], female   DOB: 14-Aug-1985, 29 y.o.   MRN: [id number] HD #42 Cervical Insufficiency 29wks 6d with Rescue cerclage  S: No complaints No bleeding.  ? LOF noted , ? Vaginal odor Good FM. No CP or SOB or HA Occ vaginal pressure, no pain  O: BP 116/66 mmHg  Pulse 78  Temp(Src) 98.4 F (36.9 C) (Oral)  Resp 18  Ht 5\' 5"  (1.651 m)  Wt 104.962 kg (231 lb 6.4 oz)  BMI 38.51 kg/m2  SpO2 96%  LMP 12/12/2013 (LMP Unknown)  Tmax 98.4 Pm100  WDWN WF NAD Neck: supple with FROM Lungs: CTA, No WRR CV: RRR, No MRG Abd: Gravid, NT, Fundus appropriate with dates, No CVAT Pelvic: deferred Neuro: non focal Skin: intact  FHTs 140-160 Reactive FHR with accels noted, BTBV 6-25- category 1 Reactive NST No contractions noted GBS neg 3/13 Sono 4/4 c/w AGA, vtx, nl AFI- Nl AFI (slightly improved 4/11 after pos amnisure noted) Sono 4/4 c/w 2cm CL with funneling to cerclage Normal 3gtt 3/31 Amnisure negative 4/9, positve 4/11, 4/13 BMZ 3/8, 3/9  CBC    Component Value Date/Time   WBC 7.5 08/19/2014 0935   RBC 4.08 08/19/2014 0935   HGB 12.6 08/19/2014 0935   HCT 36.5 08/19/2014 0935   PLT 216 08/19/2014 0935   MCV 89.5 08/19/2014 0935   MCH 30.9 08/19/2014 0935   MCHC 34.5 08/19/2014 0935   RDW 13.1 08/19/2014 0935   LYMPHSABS 1.7 08/19/2014 0935   MONOABS 0.5 08/19/2014 0935   EOSABS 0.1 08/19/2014 0935   BASOSABS 0.0 08/19/2014 0935      A: 29 wk 6d IUP with AGA, low normal but stable AFI Presumptive PPROM - Amnisure positive x 2, Fern/nitrazine neg, AFI stable Cervical Insufficiency s/p rescue cerclage, CL 3/29,pt declined pessary trial- cerclage intact Remote history of Epilepsy- no meds ? CHTN - stable BP, no s/s PEC No s/s chorio   P: PO Amoxicillin course No tocolysis if labors BMZ booster x one today Mg neuroprophylaxis if labors before 32 weeks DC Vaginal prometrium  Start weekly 17P Weekly AFI Bedrest with BRP NICU consult  done Continue NST qd Continue inpatient management

## 2014-08-21 NOTE — Progress Notes (Signed)
Called to see patient because she felt "something" in vagina.   29 6/7 wks with PPROM.  No vaginal bleeding.  No UC.  No abdo-pelvic pain.  No fever.  Sterile speculum exam:  Clear d/c/AF.  Cerclage intact.  Knot coming to 1/3-1/2 down in vagina.  Nothing else in vagina. FHR monitoring 130's with good variability, accelerations, no deceleration.  Stable Unchanged clinical state Continue same  Genia DelMarie-Lyne Salaya Holtrop MD  08/21/2014 at 4:20 pm

## 2014-08-22 LAB — TYPE AND SCREEN
ABO/RH(D): B POS
Antibody Screen: NEGATIVE

## 2014-08-23 NOTE — Progress Notes (Signed)
Patient ID: Brooke [name], female   DOB: [birth year], 29 y.o.   MRN: [id number] HD #44 Cervical Insufficiency 30.1wks with Rescue cerclage Suspected PROM with stable AFI  S: No complaints. 17-P inj causing pain and was hoping to discuss stopping, reviewed benefits, recommend hold pressure and ice at site, agrees to continue as planned since has only 3-4 more injections.  No bleeding/ pressure/ foul odor per vagina. Continues to have slight to more vag discharge daily. Peri Jefferson.  Good FM. No CP or SOB or HA   O: BP 114/74 mmHg  Pulse 95  Temp(Src) 98.5 F (36.9 C) (Oral)  Resp 20  Ht 5\' 5"  (1.651 m)  Wt 231 lb 6.4 oz (104.962 kg)  BMI 38.51 kg/m2  SpO2 96%  LMP 12/12/2013 (LMP Unknown)  A&Ox3 pleasant. NAD Lungs: CTA bilateral  CV: RRR Abd: Gravid, NT, Fundus appropriate with dates, soft, relaxed, non-tender uterus  No CVAT Pelvic: deferred Extr- no swelling/ tenderness/ erythema bilateral   FHTs 140-160 Reactive / category I and No contractions noted  GBS neg 3/13 Sono 4/4 c/w AGA, vtx, nl AFI- Nl AFI (slightly improved 4/11 after pos amnisure noted) Sono 4/4 c/w 2cm CL with funneling to cerclage Normal 3gtt 3/31 Amnisure negative 4/9, positve 4/11, 4/13 BMZ 3/8, 3/9  A: 30 wk 1d IUP with AGA, low normal but stable AFI Presumed PPROM - Amnisure positive x 2, Fern/nitrazine neg, AFI stable and no evidence of infection  Cervical Insufficiency s/p rescue cerclage, CL 3/29, pt declined pessary trial- cerclage intact Remote history of Epilepsy- no meds ? CHTN - stable BP, no s/s PEC No s/s chorio   P: PO Amoxicillin course No tocolysis if labors BMZ booster x one on 08/21/14 Mg neuroprophylaxis if labors before 32 weeks DC Vaginal prometrium and Started weekly 17P until 34 wks Weekly AFI Bedrest with BRP NICU consult done Continue NST qd Continue inpatient management

## 2014-08-23 NOTE — Plan of Care (Signed)
Problem: Phase I Progression Outcomes Goal: No significant worsening bleeding/cervix change/vag drainage No significant worsening in vaginal bleeding, cervical change, or vaginal drainage.  Outcome: Progressing Pt acknowledge to report any signs or symptoms worsening in condition.

## 2014-08-24 NOTE — Progress Notes (Signed)
Patient ID: Brooke [name], female   DOB: [birth year], 29 y.o.   MRN: [id number] HD #45 Cervical Insufficiency 30.2wks with Rescue cerclage Suspected PROM with stable AFI  S: No complaints. No bleeding/ pressure/ foul odor per vagina. Continues to have slight to more vag discharge daily. Peri Jefferson.  Good FM. No CP or SOB or HA   O: BP 117/69 mmHg  Pulse 82  Temp(Src) 98.2 F (36.8 C) (Oral)  Resp 20  Ht 5\' 5"  (1.651 m)  Wt 231 lb 6.4 oz (104.962 kg)  BMI 38.51 kg/m2  SpO2 96%  LMP 12/12/2013 (LMP Unknown)  A&Ox3 pleasant. NAD Lungs: CTA bilateral  CV: RRR Abd: Gravid, NT, Fundus appropriate with dates, soft, relaxed, non-tender uterus  No CVAT Pelvic: deferred Extr- no swelling/ tenderness/ erythema bilateral   FHTs 140-160 Reactive / category I and No contractions noted  GBS neg 3/13 Sono 4/4 c/w AGA, vtx, nl AFI- Nl AFI (slightly improved 4/11 after pos amnisure noted) Sono 4/4 c/w 2cm CL with funneling to cerclage Normal 3gtt 3/31 Amnisure negative 4/9, positve 4/11, 4/13 BMZ 3/8, 3/9  A: 30 wk 2d IUP with AGA, low normal but stable AFI Presumed PPROM - Amnisure positive x 2, Fern/nitrazine neg, AFI stable and no evidence of infection  Cervical Insufficiency s/p rescue cerclage Remote history of Epilepsy- no meds ? CHTN - stable BP, no s/s PEC No s/s chorio   P: PO Amoxicillin course- D/c tomorrow  No tocolysis if labors BMZ booster x one on 08/21/14 Mg neuroprophylaxis if labors before 32 weeks DC Vaginal prometrium and Started weekly 17P until 34 wks Weekly AFI Bedrest with BRP NICU consult done Continue NST qd Continue inpatient management

## 2014-08-25 LAB — TYPE AND SCREEN
ABO/RH(D): B POS
Antibody Screen: NEGATIVE

## 2014-08-25 NOTE — Progress Notes (Signed)
Ur chart review completed.  

## 2014-08-25 NOTE — Progress Notes (Signed)
HD #46 Cervical Insufficiency 30.3wks with Rescue cerclage Suspected PROM with stable AFI   S: No complaints. No bleeding/ pressure/ foul odor per vagina. Continues to have slight vag discharge daily.  Good FM. No CP or SOB or HA . Pt asking about cutting cerclae and moving to delivery  O: Filed Vitals:   08/24/14 2110 08/25/14 0647 08/25/14 0815 08/25/14 1152  BP:   144/88 150/93  Pulse:   87 91  Temp:   98.6 F (37 C) 98.4 F (36.9 C)  TempSrc:   Oral Oral  Resp: 20 18 20 18   Height:      Weight:      SpO2:        A&Ox3 pleasant. NAD  Abd: Gravid, NT, Fundus appropriate with dates, soft, relaxed, non-tender uterus  No CVAT Pelvic: deferred Extr- no swelling/ tenderness/ erythema bilateral   FHTs 140-160 Reactive / category I and No contractions noted Toco: none  GBS neg 3/13 Sono 4/4 c/w AGA, vtx, nl AFI- Nl AFI (slightly improved 4/11 after pos amnisure noted) Sono 4/4 c/w 2cm CL with funneling to cerclage Normal 3gtt 3/31 Amnisure negative 4/9, positve 4/11, 4/13 BMZ 3/8, 3/9  A: 30 wk 3d IUP with AGA, low normal but stable AFI Presumed PPROM - Amnisure positive x 2, Fern/nitrazine neg, AFI stable and no evidence of infection. Will treat as PPROM, pt completing latency antibiotics Cervical Insufficiency s/p rescue cerclage Remote history of Epilepsy- no meds ? CHTN - stable BP, no s/s PEC. bp's slightly elevated today. Will add labs to assess for preeclampisa No s/s chorio   P: PO Amoxicillin course- completing No tocolysis if labors BMZ booster x one on 08/21/14 Mg neuroprophylaxis if labors before 32 weeks DC Vaginal prometrium and Started weekly 17P until 34 wks Weekly AFI Bedrest with BRP NICU consult done at admission but will request another consult now as pt asking about cutting cerclage and having delivery, I believe pt needs more information about prematurity to encourage her to continue expectant management Continue NST qd Continue  inpatient management  Fleming Prill A. 08/25/2014 1:10 PM

## 2014-08-25 NOTE — Consult Note (Signed)
I met with Brooke [name] at the request of Dr. Pamala Hurry.  The patient is a primagravida who had preterm labor at 23 weeks and has been managed with a cerclage.  She has been admitted for evaluation of preterm labor with reported fluid leaking.  There is no evidence of infection and the assessments of fetal well being have been reassuring.  We discussed the advantages of continuing the pregnancy as long as possible in order to minimize the adverse effects of premature delivery on health and neurodevelopmental outcomes.  Birth at [redacted] weeks gestation carries a significant risk for cognitive impairent that would affect school performance as well as emotional and executive functioning.  I discussed these risks with Brooke [name] as well as the possible and likely hospital courses for babies born at 30-[redacted] weeks gestation.  She appeared to understand and agree with my recommendation to comply with the present plan for expectant management in order to prolong the pregnancy safely.  Krisandra Bueno L. Patterson Hammersmith, M.D.

## 2014-08-26 NOTE — Progress Notes (Signed)
Hospital day # 47 pregnancy at 6856w4d  PPROM/Incompetent cervix with cerclage                                                     BMethasone rescue dose done S: well, reports good fetal activity      Contractions:none      Vaginal bleeding:none now       Vaginal discharge: clear and no significant change.  A little more AF leak late evening  O: BP 129/80 mmHg  Pulse 102  Temp(Src) 98.5 F (36.9 C) (Oral)  Resp 18  Ht 5\' 5"  (1.651 m)  Wt 231 lb 6.4 oz (104.962 kg)  BMI 38.51 kg/m2  SpO2 96%  LMP 12/12/2013 (LMP Unknown)      Fetal tracings:Fetal heart variability: moderate, accelerations present, no deceleration, base line 140's,      reviewed and reassuring      Uterus non-tender      Extremities: no significant edema and no signs of DVT  A: 1556w4d with PPROM, Incompetent cervix with cerclage.  Fetal well-being reassuring.  No sign of Chorio or labor.     Seen by Neonat yesterday, difference in prognosis between 30 and 34 wks explained to patient.  Pt feels better about     expectant management.  P: Continue current plan of care.  Repeat CBC 4/21st with T+S.   Lailie Smead,MARIE-LYNE  MD 08/26/2014 2:01 PM

## 2014-08-27 ENCOUNTER — Inpatient Hospital Stay (HOSPITAL_COMMUNITY): Payer: 59

## 2014-08-27 NOTE — Progress Notes (Signed)
Patient ID: Brooke [name], female   DOB: 1985-07-29, 29 y.o.   MRN: [id number] HD #48 Cervical Insufficiency 30wks 5d with Rescue cerclage PPROM with stable AFI  S: No complaints. No bleeding/ pressure/ foul odor per vagina. Continues to have fluid leakage.  Good FM. No CP or SOB or HA   O: BP 137/55 mmHg  Pulse 92  Temp(Src) 98.1 F (36.7 C) (Oral)  Resp 18  Ht 5\' 5"  (1.651 m)  Wt 104.962 kg (231 lb 6.4 oz)  BMI 38.51 kg/m2  SpO2 96%  LMP 12/12/2013 (LMP Unknown)  Tm 98.8 A&Ox3 pleasant. NAD Lungs: CTA bilateral  CV: RRR Abd: Gravid, NT, Fundus appropriate with dates, soft, relaxed, non-tender uterus  No CVAT Pelvic: deferred Extr- neg c/c/e Skin: no lesions Neuro : intact  CBC    Component Value Date/Time   WBC 7.5 08/19/2014 0935   RBC 4.08 08/19/2014 0935   HGB 12.6 08/19/2014 0935   HCT 36.5 08/19/2014 0935   PLT 216 08/19/2014 0935   MCV 89.5 08/19/2014 0935   MCH 30.9 08/19/2014 0935   MCHC 34.5 08/19/2014 0935   RDW 13.1 08/19/2014 0935   LYMPHSABS 1.7 08/19/2014 0935   MONOABS 0.5 08/19/2014 0935   EOSABS 0.1 08/19/2014 0935   BASOSABS 0.0 08/19/2014 0935      FHTs 140-160 Reactive / category I and No contractions, no decels  GBS neg 3/13 Sono 4/4 c/w AGA, vtx, nl AFI- Nl AFI (slightly improved 4/11 after pos amnisure noted) Sono 4/4 c/w 2cm CL with funneling to cerclage Normal 3gtt 3/31 BMZ 3/8, 3/9- booster 4/14  A: 30 wk 5d IUP with AGA, low normal but stable AFI PPROM - no evidence of infection  Cervical Insufficiency s/p rescue cerclage Remote history of Epilepsy- no meds ? CHTN - stable BP, no s/s PEC No s/s chorio   P: Will do AFI and CBC today Rpt GBS today Latency abx completed No tocolysis if labors BMZ complete Mg neuroprophylaxis if labors before 32 weeks Weekly 17P until 34 wks Weekly AFI Bedrest with BRP NICU consult done Continue NST qd Continue inpatient management

## 2014-08-27 NOTE — Plan of Care (Signed)
Problem: Phase II Progression Outcomes Goal: Labs/tests as ordered Labs/tests as ordered (Magnesium level, CBG's, CBC, CMET, 24 hr Urine, Amniocentesis, Ultrasound, Other)  Outcome: Progressing Ultrasound done today.

## 2014-08-28 LAB — CBC WITH DIFFERENTIAL/PLATELET
Basophils Absolute: 0 10*3/uL (ref 0.0–0.1)
Basophils Relative: 0 % (ref 0–1)
EOS PCT: 2 % (ref 0–5)
Eosinophils Absolute: 0.2 10*3/uL (ref 0.0–0.7)
HEMATOCRIT: 40.6 % (ref 36.0–46.0)
HEMOGLOBIN: 13.7 g/dL (ref 12.0–15.0)
LYMPHS ABS: 2.8 10*3/uL (ref 0.7–4.0)
LYMPHS PCT: 29 % (ref 12–46)
MCH: 31.1 pg (ref 26.0–34.0)
MCHC: 33.7 g/dL (ref 30.0–36.0)
MCV: 92.1 fL (ref 78.0–100.0)
MONOS PCT: 7 % (ref 3–12)
Monocytes Absolute: 0.7 10*3/uL (ref 0.1–1.0)
Neutro Abs: 5.9 10*3/uL (ref 1.7–7.7)
Neutrophils Relative %: 62 % (ref 43–77)
PLATELETS: 219 10*3/uL (ref 150–400)
RBC: 4.41 MIL/uL (ref 3.87–5.11)
RDW: 13.5 % (ref 11.5–15.5)
WBC: 9.6 10*3/uL (ref 4.0–10.5)

## 2014-08-28 LAB — TYPE AND SCREEN
ABO/RH(D): B POS
ANTIBODY SCREEN: NEGATIVE

## 2014-08-28 LAB — CULTURE, BETA STREP (GROUP B ONLY)

## 2014-08-28 NOTE — Progress Notes (Signed)
Patient ID: Brooke [name], female   DOB: 03/29/1986, 29 y.o.   MRN: [id number] HD #49 Cervical Insufficiency 30wks 6d with Rescue cerclage PPROM with stable AFI  S: No complaints. No bleeding/ pressure/ foul odor per vagina. Continues to have fluid leakage.  Good FM. No CP or SOB or HA   O: BP 130/85 mmHg  Pulse 103  Temp(Src) 98.4 F (36.9 C) (Oral)  Resp 18  Ht 5\' 5"  (1.651 m)  Wt 104.191 kg (229 lb 11.2 oz)  BMI 38.22 kg/m2  SpO2 96%  LMP 12/12/2013 (LMP Unknown)  Tm 98.5 A&Ox3 pleasant. NAD Lungs: CTA bilateral  CV: RRR Abd: Gravid, NT, Fundus appropriate with dates, soft, relaxed, non-tender uterus  No CVAT Pelvic: deferred Extr- neg c/c/e Skin: no lesions Neuro : intact  CBC    Component Value Date/Time   WBC 9.6 08/28/2014 0805   RBC 4.41 08/28/2014 0805   HGB 13.7 08/28/2014 0805   HCT 40.6 08/28/2014 0805   PLT 219 08/28/2014 0805   MCV 92.1 08/28/2014 0805   MCH 31.1 08/28/2014 0805   MCHC 33.7 08/28/2014 0805   RDW 13.5 08/28/2014 0805   LYMPHSABS 2.8 08/28/2014 0805   MONOABS 0.7 08/28/2014 0805   EOSABS 0.2 08/28/2014 0805   BASOSABS 0.0 08/28/2014 0805     FHTs 140-160 Reactive / category I and No contractions, no decels  GBS neg 3/13, pending 4/20 Sono 4/4 c/w AGA, vtx, nl AFI- Nl AFI (4/20) Sono 4/4 c/w 2cm CL with funneling to cerclage Normal 3gtt 3/31 BMZ 3/8, 3/9- booster 4/14  A: 30 wk 6d IUP with AGA, low normal but stable AFI PPROM - no evidence of infection  Cervical Insufficiency s/p rescue cerclage Remote history of Epilepsy- no meds ? CHTN - stable BP, no s/s PEC No s/s chorio   P: Rpt GBS done Latency abx completed No tocolysis if labors BMZ complete Mg neuroprophylaxis if labors before 32 weeks Weekly 17P until 34 wks Weekly AFI Bedrest with BRP NICU consult done Continue NST qd Continue inpatient management

## 2014-08-29 MED ORDER — PANTOPRAZOLE SODIUM 40 MG PO TBEC
40.0000 mg | DELAYED_RELEASE_TABLET | Freq: Every day | ORAL | Status: DC
  Administered 2014-08-29 – 2014-09-21 (×24): 40 mg via ORAL
  Filled 2014-08-29 (×24): qty 1

## 2014-08-29 NOTE — Progress Notes (Signed)
Pt complaining of severe heartburn unrelieved by pepcid that was given this morning. Dt. Billy Coastaavon called and orders changed to protonix.

## 2014-08-29 NOTE — Progress Notes (Signed)
Patient ID: [name], female   DOB: Jul 23, 1985, 29 y.o.   MRN: [id number] HD #50 Cervical Insufficiency 31wks 0d with Rescue cerclage PPROM with stable AFI  S: No complaints. No bleeding or foul odor per vagina. Continues to have fluid leakage.  Feels occ pressure. Good FM. No CP or SOB or HA   O: BP 129/94 mmHg  Pulse 117  Temp(Src) 98.2 F (36.8 C) (Oral)  Resp 20  Ht 5\' 5"  (1.651 m)  Wt 104.191 kg (229 lb 11.2 oz)  BMI 38.22 kg/m2  SpO2 96%  LMP 12/12/2013 (LMP Unknown)  Tm 98.5  A&Ox3 pleasant. NAD Lungs: CTA bilateral  CV: RRR Abd: Gravid, NT, Fundus appropriate with dates, soft, relaxed, non-tender uterus  No CVAT Pelvic: deferred Extr- neg c/c/e Skin: no lesions Neuro : intact  CBC    Component Value Date/Time   WBC 9.6 08/28/2014 0805   RBC 4.41 08/28/2014 0805   HGB 13.7 08/28/2014 0805   HCT 40.6 08/28/2014 0805   PLT 219 08/28/2014 0805   MCV 92.1 08/28/2014 0805   MCH 31.1 08/28/2014 0805   MCHC 33.7 08/28/2014 0805   RDW 13.5 08/28/2014 0805   LYMPHSABS 2.8 08/28/2014 0805   MONOABS 0.7 08/28/2014 0805   EOSABS 0.2 08/28/2014 0805   BASOSABS 0.0 08/28/2014 0805     FHTs 140-160 Reactive / category I and No contractions, no decels  GBS neg  4/20 Sono 4/4 c/w AGA, vtx, nl AFI- Nl AFI (4/20) Sono 4/4 c/w 2cm CL with funneling to cerclage Normal 3gtt 3/31 BMZ 3/8, 3/9- booster dose 4/14  A: 31 wk 0d IUP with AGA, low normal but stable AFI PPROM - no evidence of infection  Cervical Insufficiency s/p rescue cerclage Remote history of Epilepsy- no meds ? CHTN - stable BP, no s/s PEC No s/s chorio   P: Rpt growth sono next week Latency abx completed No tocolysis if labors BMZ complete Mg neuroprophylaxis if labors before 32 weeks Weekly 17P until 34 wks- pt declined 17p this week Weekly AFI Bedrest with BRP NICU consult done Continue NST qd Continue inpatient management

## 2014-08-30 LAB — COMPREHENSIVE METABOLIC PANEL
ALT: 13 U/L (ref 0–35)
ANION GAP: 7 (ref 5–15)
AST: 18 U/L (ref 0–37)
Albumin: 3 g/dL — ABNORMAL LOW (ref 3.5–5.2)
Alkaline Phosphatase: 81 U/L (ref 39–117)
BUN: 8 mg/dL (ref 6–23)
CALCIUM: 9 mg/dL (ref 8.4–10.5)
CHLORIDE: 107 mmol/L (ref 96–112)
CO2: 21 mmol/L (ref 19–32)
Creatinine, Ser: 0.52 mg/dL (ref 0.50–1.10)
GFR calc non Af Amer: >90 mL/min (ref >90)
GLUCOSE: 178 mg/dL — AB (ref 70–99)
Potassium: 3.9 mmol/L (ref 3.5–5.1)
Sodium: 135 mmol/L (ref 135–145)
TOTAL PROTEIN: 6 g/dL (ref 6.0–8.3)
Total Bilirubin: 0.3 mg/dL (ref 0.3–1.2)

## 2014-08-30 NOTE — Progress Notes (Signed)
Patient ID: Brooke [name], female   DOB: [birth year], 29 y.o.   MRN: [id number] HD #51 Cervical Insufficiency 31wks 1d with Rescue cerclage PPROM with stable AFI  S: No complaints. No bleeding or foul odor per vagina. Continues to have fluid leakage.  Feels occ pressure. Good FM. No CP or SOB or HA   O: BP 147/80 mmHg  Pulse 99  Temp(Src) 98.7 F (37.1 C) (Oral)  Resp 18  Ht 5\' 5"  (1.651 m)  Wt 104.191 kg (229 lb 11.2 oz)  BMI 38.22 kg/m2  SpO2 96%  LMP 12/12/2013 (LMP Unknown)  Tm 98.8  A&Ox3 pleasant. NAD Lungs: CTA , No WRR CV: RRR Abd: Gravid, NT, Fundus appropriate with dates, soft, non-tender uterus  No CVAT Pelvic: deferred Extr- neg c/c/e Skin: no lesions Neuro : intact  CBC    Component Value Date/Time   WBC 9.6 08/28/2014 0805   RBC 4.41 08/28/2014 0805   HGB 13.7 08/28/2014 0805   HCT 40.6 08/28/2014 0805   PLT 219 08/28/2014 0805   MCV 92.1 08/28/2014 0805   MCH 31.1 08/28/2014 0805   MCHC 33.7 08/28/2014 0805   RDW 13.5 08/28/2014 0805   LYMPHSABS 2.8 08/28/2014 0805   MONOABS 0.7 08/28/2014 0805   EOSABS 0.2 08/28/2014 0805   BASOSABS 0.0 08/28/2014 0805     FHTs 140-160 Reactive / category I and No contractions, no decels  GBS neg  4/20 Sono 4/4 c/w AGA, vtx, nl AFI- Nl AFI (4/20) Sono 4/4 c/w 2cm CL with funneling to cerclage Normal 3gtt 3/31 BMZ 3/8, 3/9- booster dose 4/14  A: 31 wk 1d IUP with AGA, low normal but stable AFI PPROM - no evidence of infection  Cervical Insufficiency s/p rescue cerclage Remote history of Epilepsy- no meds ? CHTN - stable BP, no s/s PEC No s/s chorio Epigastric pain resolved   P: Given worsening epigastric pain and higher BP trend , will ck CMP today. Monitor s/s PEC Rpt growth sono next week Latency abx completed No tocolysis if labors BMZ complete Mg neuroprophylaxis if labors before 32 weeks Weekly 17P until 34 wks- pt declined 17p this week Weekly AFI Bedrest with BRP NICU consult  done Continue NST qd Continue inpatient management

## 2014-08-31 LAB — TYPE AND SCREEN
ABO/RH(D): B POS
Antibody Screen: NEGATIVE

## 2014-08-31 NOTE — Progress Notes (Signed)
Patient ID: Brooke [name], female   DOB: 10-25-1985, 29 y.o.   MRN: [id number] HD #[id number] Cervical Insufficiency 31wks 2d with Rescue cerclage PPROM with stable AFI  S: No complaints. No bleeding or foul odor per vagina. Continues to have fluid leakage.  Feels occ pressure. Good FM. No CP or SOB or HA \ Mild right fundal tenderness, No epigastric pain  O: BP 134/71 mmHg  Pulse 92  Temp(Src) 98.4 F (36.9 C) (Oral)  Resp 18  Ht 5\' 5"  (1.651 m)  Wt 104.191 kg (229 lb 11.2 oz)  BMI 38.22 kg/m2  SpO2 96%  LMP 12/12/2013 (LMP Unknown)  Tm 98.8  A&Ox3 pleasant. NAD Lungs: CTA , No WRR CV: RRR Abd: Gravid, NT, Fundus appropriate with dates, soft, non-tender uterus, small focus of tenderness c/w fetal positional change No CVAT Pelvic: deferred Extr- neg c/c/e Skin: no lesions Neuro : intact  CBC    Component Value Date/Time   WBC 9.6 08/28/2014 0805   RBC 4.41 08/28/2014 0805   HGB 13.7 08/28/2014 0805   HCT 40.6 08/28/2014 0805   PLT 219 08/28/2014 0805   MCV 92.1 08/28/2014 0805   MCH 31.1 08/28/2014 0805   MCHC 33.7 08/28/2014 0805   RDW 13.5 08/28/2014 0805   LYMPHSABS 2.8 08/28/2014 0805   MONOABS 0.7 08/28/2014 0805   EOSABS 0.2 08/28/2014 0805   BASOSABS 0.0 08/28/2014 0805   CMP     Component Value Date/Time   NA 135 08/30/2014 1142   K 3.9 08/30/2014 1142   CL 107 08/30/2014 1142   CO2 21 08/30/2014 1142   GLUCOSE 178* 08/30/2014 1142   BUN 8 08/30/2014 1142   CREATININE 0.52 08/30/2014 1142   CALCIUM 9.0 08/30/2014 1142   PROT 6.0 08/30/2014 1142   ALBUMIN 3.0* 08/30/2014 1142   AST 18 08/30/2014 1142   ALT 13 08/30/2014 1142   ALKPHOS 81 08/30/2014 1142   BILITOT 0.3 08/30/2014 1142   GFRNONAA >90 08/30/2014 1142   GFRAA >90 08/30/2014 1142      FHTs 140-160 Reactive / category I and No contractions, no decels  GBS neg  4/20 Sono 4/4 c/w AGA, vtx, nl AFI- Nl AFI (4/20) Sono 4/4 c/w 2cm CL with funneling to cerclage Normal 3gtt  3/31 BMZ 3/8, 3/9- booster dose 4/14  A: 31 wk 2d IUP with AGA, low normal but stable AFI PPROM - no evidence of chorio Cervical Insufficiency s/p rescue cerclage Remote history of Epilepsy- no meds ? CHTN - stable BP, no s/s PEC Epigastric pain resolved-nl CMP  P: Monitor s/s PEC Rpt growth sono next week Latency abx completed No tocolysis if labors BMZ complete Mg neuroprophylaxis if labors before 32 weeks Weekly 17P until 34 wks- pt declined 17p this week Weekly AFI Bedrest with BRP NICU consult done Continue NST qd Continue inpatient management

## 2014-09-01 ENCOUNTER — Inpatient Hospital Stay (HOSPITAL_COMMUNITY): Payer: 59

## 2014-09-01 NOTE — Progress Notes (Signed)
Inpatient Diabetes Program Recommendations  ADA Standards of Care 2012:  Dabetes in Pregnancy Target Ranges: Fasting - 60-90 mg/dL  Preprandial- 40-98160-105 mg/dL Postprandial < 191140 mg/dL (7.8 mmol/L)     Noted one abnormal glucose lab value of 178 mg/dL on 4/78/294/23/16 and noted only one abnormal glucose during the 3 hr glucose tolerance test. Pt on Metformin originally for PCOS and continues to be taking at this time. Please consider checking a cbg at different times of the day for a few days, i.e. Fasting one day, an HS another day,a mid-day before lunch to assess fasting and post-prandial needs if any.   Thank you Lenor CoffinAnn Naomia Lenderman, RN, MSN, CDE  Diabetes Inpatient Program Office: [phone] Pager: [phone] 8:00 am to 5:00 pm

## 2014-09-01 NOTE — Progress Notes (Signed)
Patient ID: Brooke [name], female   DOB: 04/07/1986, 29 y.o.   MRN: [id number] HD #[id number] Cervical Insufficiency 31wks 3d with Rescue cerclage PPROM with stable AFI  S: No complaints. No bleeding or foul odor per vagina. Continues to have fluid leakage.  Feels occ pressure. Good FM. No CP or SOB or HA  Mild right fundal tenderness now improved, No epigastric pain  O: BP 136/85 mmHg  Pulse 88  Temp(Src) 98.3 F (36.8 C) (Oral)  Resp 18  Ht 5\' 5"  (1.651 m)  Wt 104.191 kg (229 lb 11.2 oz)  BMI 38.22 kg/m2  SpO2 96%  LMP 12/12/2013 (LMP Unknown)  Tm 98.8  A&Ox3 pleasant. NAD Lungs: CTA , No WRR CV: RRR Abd: Gravid, NT, Fundus appropriate with dates, soft, non-tender uterus,  No CVAT Pelvic: deferred Extr- neg c/c/e Skin: no lesions Neuro : intact  CBC    Component Value Date/Time   WBC 9.6 08/28/2014 0805   RBC 4.41 08/28/2014 0805   HGB 13.7 08/28/2014 0805   HCT 40.6 08/28/2014 0805   PLT 219 08/28/2014 0805   MCV 92.1 08/28/2014 0805   MCH 31.1 08/28/2014 0805   MCHC 33.7 08/28/2014 0805   RDW 13.5 08/28/2014 0805   LYMPHSABS 2.8 08/28/2014 0805   MONOABS 0.7 08/28/2014 0805   EOSABS 0.2 08/28/2014 0805   BASOSABS 0.0 08/28/2014 0805   CMP     Component Value Date/Time   NA 135 08/30/2014 1142   K 3.9 08/30/2014 1142   CL 107 08/30/2014 1142   CO2 21 08/30/2014 1142   GLUCOSE 178* 08/30/2014 1142   BUN 8 08/30/2014 1142   CREATININE 0.52 08/30/2014 1142   CALCIUM 9.0 08/30/2014 1142   PROT 6.0 08/30/2014 1142   ALBUMIN 3.0* 08/30/2014 1142   AST 18 08/30/2014 1142   ALT 13 08/30/2014 1142   ALKPHOS 81 08/30/2014 1142   BILITOT 0.3 08/30/2014 1142   GFRNONAA >90 08/30/2014 1142   GFRAA >90 08/30/2014 1142      FHTs 140-160 Reactive / category I and No contractions, no decels  GBS neg  4/20 Sono 4/25 c/w AGA, vtx, nl AFI Normal 3gtt 3/31 BS on CMP 178 BMZ 3/8, 3/9- booster dose 4/14  A: 31 wk 3d IUP with AGA, low normal but stable  AFI PPROM - no evidence of chorio Cervical Insufficiency s/p rescue cerclage Remote history of Epilepsy- no meds ? CHTN - stable BP, no s/s PEC History of one abnl on 3gtt, Random BS on CMP 178  P: Monitor s/s PEC Ck FBS and random PP BS Latency abx completed No tocolysis if labors BMZ complete Mg neuroprophylaxis if labors before 32 weeks Weekly 17P until 34 wks- pt declined 17p this week Weekly AFI Bedrest with BRP NICU consult done Continue NST qd Continue inpatient management

## 2014-09-01 NOTE — Progress Notes (Signed)
Ur chart review completed.  

## 2014-09-02 LAB — GLUCOSE, CAPILLARY: Glucose-Capillary: 92 mg/dL (ref 70–99)

## 2014-09-02 MED ORDER — BUTALBITAL-APAP-CAFFEINE 50-325-40 MG PO TABS
2.0000 | ORAL_TABLET | Freq: Once | ORAL | Status: AC
  Administered 2014-09-02: 2 via ORAL
  Filled 2014-09-02: qty 1
  Filled 2014-09-02: qty 2

## 2014-09-02 MED ORDER — BUTALBITAL-APAP-CAFFEINE 50-325-40 MG PO TABS
2.0000 | ORAL_TABLET | Freq: Once | ORAL | Status: AC
  Administered 2014-09-02: 2 via ORAL
  Filled 2014-09-02: qty 2

## 2014-09-02 NOTE — Progress Notes (Signed)
Patient ID: Brooke [name], female   DOB: Jan 21, 1986, 29 y.o.   MRN: [id number] HD #[id number] Cervical Insufficiency 31wks 4d with Rescue cerclage PPROM with stable AFI  S: No complaints. No bleeding or foul odor per vagina. Continues to have fluid leakage.  Feels occ pressure. Good FM. No CP or SOB or HA  Mild right fundal tenderness now improved, No epigastric pain  O: BP 130/81 mmHg  Pulse 103  Temp(Src) 98.6 F (37 C) (Oral)  Resp 20  Ht 5\' 5"  (1.651 m)  Wt 104.191 kg (229 lb 11.2 oz)  BMI 38.22 kg/m2  SpO2 96%  LMP 12/12/2013 (LMP Unknown)  Tm 98.8  A&Ox3 pleasant. NAD Lungs: CTA , No WRR CV: RRR Abd: Gravid, NT, Fundus appropriate with dates, soft, non-tender uterus,  No CVAT Pelvic: deferred Extr- neg c/c/e Skin: no lesions Neuro : intact  CBC    Component Value Date/Time   WBC 9.6 08/28/2014 0805   RBC 4.41 08/28/2014 0805   HGB 13.7 08/28/2014 0805   HCT 40.6 08/28/2014 0805   PLT 219 08/28/2014 0805   MCV 92.1 08/28/2014 0805   MCH 31.1 08/28/2014 0805   MCHC 33.7 08/28/2014 0805   RDW 13.5 08/28/2014 0805   LYMPHSABS 2.8 08/28/2014 0805   MONOABS 0.7 08/28/2014 0805   EOSABS 0.2 08/28/2014 0805   BASOSABS 0.0 08/28/2014 0805   CMP     Component Value Date/Time   NA 135 08/30/2014 1142   K 3.9 08/30/2014 1142   CL 107 08/30/2014 1142   CO2 21 08/30/2014 1142   GLUCOSE 178* 08/30/2014 1142   BUN 8 08/30/2014 1142   CREATININE 0.52 08/30/2014 1142   CALCIUM 9.0 08/30/2014 1142   PROT 6.0 08/30/2014 1142   ALBUMIN 3.0* 08/30/2014 1142   AST 18 08/30/2014 1142   ALT 13 08/30/2014 1142   ALKPHOS 81 08/30/2014 1142   BILITOT 0.3 08/30/2014 1142   GFRNONAA >90 08/30/2014 1142   GFRAA >90 08/30/2014 1142      FHTs 140-160 Reactive / category I and No contractions, no decels  GBS neg  4/20 Sono 4/25 c/w AGA, vtx, nl AFI Normal 3gtt 3/31 BS on CMP 178 BMZ 3/8, 3/9- booster dose 4/14  A: 31 wk 4d IUP with AGA, low normal but stable  AFI PPROM - no evidence of chorio Cervical Insufficiency s/p rescue cerclage Remote history of Epilepsy- no meds ? CHTN - stable BP, no s/s PEC History of one abnl on 3gtt, Random BS on CMP 178 Fasting 91  P: Monitor s/s PEC Ck FBS and random PP BS Latency abx completed No tocolysis if labors BMZ complete Mg neuroprophylaxis if labors before 32 weeks Weekly 17P until 34 wks- pt declined 17p this week Weekly AFI Bedrest with BRP NICU consult done Continue NST qd Continue inpatient management

## 2014-09-02 NOTE — Progress Notes (Signed)
Pt grandmother in to see her and wants to hear the baby. EFM placed per request

## 2014-09-02 NOTE — Progress Notes (Signed)
Pt off the monitor after reassuring FHR  

## 2014-09-03 LAB — TYPE AND SCREEN
ABO/RH(D): B POS
Antibody Screen: NEGATIVE

## 2014-09-03 LAB — GLUCOSE, CAPILLARY: Glucose-Capillary: 165 mg/dL — ABNORMAL HIGH (ref 70–99)

## 2014-09-03 NOTE — Progress Notes (Signed)
Pt reports intestinal cramping, not uterine

## 2014-09-03 NOTE — Progress Notes (Signed)
Pt states she is going to sleep and request not to be disturbed while sleeping. Pt states she will call out if she has any needs.

## 2014-09-03 NOTE — Progress Notes (Signed)
Patient ID: Brooke [name], female   DOB: 12-Feb-1986, 29 y.o.   MRN: [id number] HD #[id number] Cervical Insufficiency 31wks 5d with Rescue cerclage PPROM with stable AFI  S: No complaints. No bleeding or foul odor per vagina. Continues to have fluid leakage.  Feels occ pressure. Good FM. No CP or SOB or HA  Mild right fundal tenderness now improved, No epigastric pain Mild frontal HA now resolved after Fioricet  O: BP 130/67 mmHg  Pulse 93  Temp(Src) 98.1 F (36.7 C) (Oral)  Resp 20  Ht 5\' 5"  (1.651 m)  Wt 104.191 kg (229 lb 11.2 oz)  BMI 38.22 kg/m2  SpO2 96%  LMP 12/12/2013 (LMP Unknown)  Tm 98.6  A&Ox3 pleasant. NAD Lungs: CTA , No WRR CV: RRR Abd: Gravid, NT, Fundus appropriate with dates, soft, non-tender uterus,  No CVAT Pelvic: deferred Extr- neg c/c/e Skin: no lesions Neuro : intact  CBC    Component Value Date/Time   WBC 9.6 08/28/2014 0805   RBC 4.41 08/28/2014 0805   HGB 13.7 08/28/2014 0805   HCT 40.6 08/28/2014 0805   PLT 219 08/28/2014 0805   MCV 92.1 08/28/2014 0805   MCH 31.1 08/28/2014 0805   MCHC 33.7 08/28/2014 0805   RDW 13.5 08/28/2014 0805   LYMPHSABS 2.8 08/28/2014 0805   MONOABS 0.7 08/28/2014 0805   EOSABS 0.2 08/28/2014 0805   BASOSABS 0.0 08/28/2014 0805   CMP     Component Value Date/Time   NA 135 08/30/2014 1142   K 3.9 08/30/2014 1142   CL 107 08/30/2014 1142   CO2 21 08/30/2014 1142   GLUCOSE 178* 08/30/2014 1142   BUN 8 08/30/2014 1142   CREATININE 0.52 08/30/2014 1142   CALCIUM 9.0 08/30/2014 1142   PROT 6.0 08/30/2014 1142   ALBUMIN 3.0* 08/30/2014 1142   AST 18 08/30/2014 1142   ALT 13 08/30/2014 1142   ALKPHOS 81 08/30/2014 1142   BILITOT 0.3 08/30/2014 1142   GFRNONAA >90 08/30/2014 1142   GFRAA >90 08/30/2014 1142      FHTs 140-160 Reactive / category I and No contractions, no decels  GBS neg  4/20 Sono 4/25 c/w AGA, vtx, nl AFI Normal 3gtt 3/31 BS on CMP 178 BMZ 3/8, 3/9- booster dose 4/14  A: 31  wk 5d IUP with AGA, low normal but stable AFI PPROM - no evidence of chorio Cervical Insufficiency s/p rescue cerclage Remote history of Epilepsy- no meds ? CHTN - stable BP, no s/s PEC History of one abnl on 3gtt, Random BS on CMP 178 Fasting 91  P: Monitor s/s PEC Breakfast PP BS today Latency abx completed No tocolysis if labors BMZ complete Mg neuroprophylaxis if labors before 32 weeks Weekly 17P until 34 wks- pt declined 17p this week Weekly AFI Bedrest with BRP NICU consult done Continue NST qd Continue inpatient management

## 2014-09-04 LAB — GLUCOSE, CAPILLARY: GLUCOSE-CAPILLARY: 117 mg/dL — AB (ref 70–99)

## 2014-09-04 NOTE — Progress Notes (Signed)
Patient ID: Brooke [name], female   DOB: 12/01/1985, 29 y.o.   MRN: [id number] HD #[id number] Cervical Insufficiency 31wks 6d with Rescue cerclage PPROM with stable AFI  S: No complaints. No bleeding or foul odor per vagina. Continues to have fluid leakage.  Feels occ pressure. Good FM. No CP or SOB or HA  Mild right fundal tenderness now improved, No epigastric pain Mild frontal HA now resolved after Fioricet  O: BP 157/87 mmHg  Pulse 96  Temp(Src) 98.5 F (36.9 C) (Oral)  Resp 18  Ht 5\' 5"  (1.651 m)  Wt 232 lb 11.2 oz (105.552 kg)  BMI 38.72 kg/m2  SpO2 96%  LMP 12/12/2013 (LMP Unknown)  Tm 98.6  A&Ox3 pleasant. NAD Lungs: CTA , No WRR CV: RRR Abd: Gravid, NT, Fundus appropriate with dates, soft, non-tender uterus,  No CVAT Pelvic: deferred Extr- neg c/c/e Skin: no lesions Neuro : intact  CBC    Component Value Date/Time   WBC 9.6 08/28/2014 0805   RBC 4.41 08/28/2014 0805   HGB 13.7 08/28/2014 0805   HCT 40.6 08/28/2014 0805   PLT 219 08/28/2014 0805   MCV 92.1 08/28/2014 0805   MCH 31.1 08/28/2014 0805   MCHC 33.7 08/28/2014 0805   RDW 13.5 08/28/2014 0805   LYMPHSABS 2.8 08/28/2014 0805   MONOABS 0.7 08/28/2014 0805   EOSABS 0.2 08/28/2014 0805   BASOSABS 0.0 08/28/2014 0805   CMP     Component Value Date/Time   NA 135 08/30/2014 1142   K 3.9 08/30/2014 1142   CL 107 08/30/2014 1142   CO2 21 08/30/2014 1142   GLUCOSE 178* 08/30/2014 1142   BUN 8 08/30/2014 1142   CREATININE 0.52 08/30/2014 1142   CALCIUM 9.0 08/30/2014 1142   PROT 6.0 08/30/2014 1142   ALBUMIN 3.0* 08/30/2014 1142   AST 18 08/30/2014 1142   ALT 13 08/30/2014 1142   ALKPHOS 81 08/30/2014 1142   BILITOT 0.3 08/30/2014 1142   GFRNONAA >90 08/30/2014 1142   GFRAA >90 08/30/2014 1142      FHTs 130-140 Reactive / category I and No contractions, no decels  GBS neg  4/20 Sono 4/25 c/w AGA, vtx, nl AFI Normal 3gtt 3/31 BS on CMP 178 BMZ 3/8, 3/9- booster dose 4/14  A: 31  wk 6d IUP with AGA, low normal but stable AFI PPROM - no evidence of chorio Cervical Insufficiency s/p rescue cerclage Remote history of Epilepsy- no meds ? CHTN - stable BP, no s/s PEC History of one abnl on 3gtt, Random BS on CMP 178 Fasting 91  P: Monitor s/s PEC Breakfast PP BS today Latency abx completed No tocolysis if labors BMZ complete Mg neuroprophylaxis if labors before 32 weeks Weekly 17P until 34 wks- pt declined 17p this week Weekly AFI Bedrest with BRP NICU consult done Continue NST qd Continue inpatient management

## 2014-09-05 LAB — GLUCOSE, CAPILLARY: GLUCOSE-CAPILLARY: 110 mg/dL — AB (ref 70–99)

## 2014-09-05 NOTE — Progress Notes (Signed)
Patient ID: Brooke [name], female   DOB: [birth year], 29 y.o.   MRN: [id number] HD #[id number] Cervical Insufficiency 32wks 0d with Rescue cerclage PPROM with stable AFI  S: No complaints. No bleeding or foul odor per vagina. Continues to have fluid leakage.  Feels occ pressure. Good FM. No CP or SOB or HA  Mild right fundal tenderness now improved, No epigastric pain Mild frontal HA now resolved after Fioricet  O: BP 152/83 mmHg  Pulse 89  Temp(Src) 98.1 F (36.7 C) (Oral)  Resp 18  Ht 5\' 5"  (1.651 m)  Wt 105.552 kg (232 lb 11.2 oz)  BMI 38.72 kg/m2  SpO2 96%  LMP 12/12/2013 (LMP Unknown)  Tm 98.6  A&Ox3 pleasant. NAD Lungs: CTA , No WRR CV: RRR Abd: Gravid, NT, Fundus appropriate with dates, soft, non-tender uterus,  No CVAT Pelvic: deferred Extr- neg c/c/e Skin: no lesions Neuro : intact  CBC    Component Value Date/Time   WBC 9.6 08/28/2014 0805   RBC 4.41 08/28/2014 0805   HGB 13.7 08/28/2014 0805   HCT 40.6 08/28/2014 0805   PLT 219 08/28/2014 0805   MCV 92.1 08/28/2014 0805   MCH 31.1 08/28/2014 0805   MCHC 33.7 08/28/2014 0805   RDW 13.5 08/28/2014 0805   LYMPHSABS 2.8 08/28/2014 0805   MONOABS 0.7 08/28/2014 0805   EOSABS 0.2 08/28/2014 0805   BASOSABS 0.0 08/28/2014 0805   CMP     Component Value Date/Time   NA 135 08/30/2014 1142   K 3.9 08/30/2014 1142   CL 107 08/30/2014 1142   CO2 21 08/30/2014 1142   GLUCOSE 178* 08/30/2014 1142   BUN 8 08/30/2014 1142   CREATININE 0.52 08/30/2014 1142   CALCIUM 9.0 08/30/2014 1142   PROT 6.0 08/30/2014 1142   ALBUMIN 3.0* 08/30/2014 1142   AST 18 08/30/2014 1142   ALT 13 08/30/2014 1142   ALKPHOS 81 08/30/2014 1142   BILITOT 0.3 08/30/2014 1142   GFRNONAA >90 08/30/2014 1142   GFRAA >90 08/30/2014 1142      FHTs 130-140 Reactive / category I and No contractions, no decels  GBS neg  4/20 Sono 4/25 c/w AGA, vtx, nl AFI Normal 3gtt 3/31 BS on CMP 178 BMZ 3/8, 3/9- booster dose 4/14  A: 32  wk 0d IUP with AGA, low normal but stable AFI PPROM - no evidence of chorio Cervical Insufficiency s/p rescue cerclage Remote history of Epilepsy- no meds ? CHTN - stable BP, no s/s PEC History of one abnl on 3gtt, Random BS on CMP 178 Fasting 91, pp bs 117  P: Monitor s/s PEC PP BS today Latency abx completed No tocolysis if labors BMZ complete Weekly 17P until 34 wks- pt declined 17p this week Weekly AFI Bedrest with BRP NICU consult done Continue NST qd Continue inpatient management

## 2014-09-06 NOTE — Progress Notes (Signed)
Patient ID: Brooke [name], female   DOB: 08-09-1985, 29 y.o.   MRN: [id number] HD #[id number] Cervical Insufficiency 32wks 1d with Rescue cerclage PPROM with stable AFI  S: No complaints. No bleeding or foul odor per vagina. Continues to have fluid leakage.  Feels occ pressure. Good FM. No CP or SOB or HA  Mild right fundal tenderness now improved, No epigastric pain Occ cramping, no contractions noted  O: BP 134/85 mmHg  Pulse 97  Temp(Src) 98.6 F (37 C) (Oral)  Resp 20  Ht 5\' 5"  (1.651 m)  Wt 105.552 kg (232 lb 11.2 oz)  BMI 38.72 kg/m2  SpO2 96%  LMP 12/12/2013 (LMP Unknown)  Tm 98.5  A&Ox3 WDWN. NAD Lungs: CTA , No WRR CV: RRR Abd: Gravid, NT, Fundus appropriate with dates, soft, non-tender uterus,  No CVAT Pelvic: deferred Extr- neg c/c/e Skin: no lesions Neuro : intact  CBC    Component Value Date/Time   WBC 9.6 08/28/2014 0805   RBC 4.41 08/28/2014 0805   HGB 13.7 08/28/2014 0805   HCT 40.6 08/28/2014 0805   PLT 219 08/28/2014 0805   MCV 92.1 08/28/2014 0805   MCH 31.1 08/28/2014 0805   MCHC 33.7 08/28/2014 0805   RDW 13.5 08/28/2014 0805   LYMPHSABS 2.8 08/28/2014 0805   MONOABS 0.7 08/28/2014 0805   EOSABS 0.2 08/28/2014 0805   BASOSABS 0.0 08/28/2014 0805   CMP     Component Value Date/Time   NA 135 08/30/2014 1142   K 3.9 08/30/2014 1142   CL 107 08/30/2014 1142   CO2 21 08/30/2014 1142   GLUCOSE 178* 08/30/2014 1142   BUN 8 08/30/2014 1142   CREATININE 0.52 08/30/2014 1142   CALCIUM 9.0 08/30/2014 1142   PROT 6.0 08/30/2014 1142   ALBUMIN 3.0* 08/30/2014 1142   AST 18 08/30/2014 1142   ALT 13 08/30/2014 1142   ALKPHOS 81 08/30/2014 1142   BILITOT 0.3 08/30/2014 1142   GFRNONAA >90 08/30/2014 1142   GFRAA >90 08/30/2014 1142      FHTs 130-140 Reactive / category I and No contractions, no decels  GBS neg  4/20 Sono 4/25 c/w AGA, vtx, nl AFI Normal 3gtt 3/31 BS on CMP 178 BMZ 3/8, 3/9- booster dose 4/14 2pp < 120 ( one  elevated)  A: 32 wk 1d IUP with AGA, low normal but stable AFI PPROM - no evidence of chorio Cervical Insufficiency s/p rescue cerclage Remote history of Epilepsy- no meds ? CHTN - stable BP, no s/s PEC History of one abnl on 3gtt, Random BS stable.  P: Monitor s/s PEC Latency abx completed No tocolysis if labors BMZ complete Weekly 17P until 34 wks- pt declined 17P Weekly AFI Bedrest with BRP, SCDs NICU consult done Continue NST qd Continue inpatient management

## 2014-09-07 DIAGNOSIS — G40909 Epilepsy, unspecified, not intractable, without status epilepticus: Secondary | ICD-10-CM

## 2014-09-07 HISTORY — DX: Epilepsy, unspecified, not intractable, without status epilepticus: G40.909

## 2014-09-07 NOTE — Progress Notes (Signed)
Patient ID: Brooke [name], female   DOB: [birth year], 29 y.o.   MRN: [id number] HD #[id number] Cervical Insufficiency 32wks 2d with Rescue cerclage PPROM with stable AFI  S: No complaints. No bleeding or foul odor per vagina. Continues to have fluid leakage.  Feels occ pressure. Good FM. No CP or SOB or HA  Mild right fundal tenderness now improved, No epigastric pain Occ cramping, no contractions noted  O: BP 122/75 mmHg  Pulse 92  Temp(Src) 98.4 F (36.9 C) (Oral)  Resp 18  Ht 5\' 5"  (1.651 m)  Wt 105.552 kg (232 lb 11.2 oz)  BMI 38.72 kg/m2  SpO2 96%  LMP 12/12/2013 (LMP Unknown)  Tm 98.6  A&Ox3 WDWN. NAD Lungs: CTA , No WRR CV: RRR Abd: Gravid, NT, Fundus appropriate with dates, soft, non-tender uterus,  No CVAT Pelvic: deferred Extr- neg c/c/e, SCDs intact Skin: no lesions Neuro : intact  CBC    Component Value Date/Time   WBC 9.6 08/28/2014 0805   RBC 4.41 08/28/2014 0805   HGB 13.7 08/28/2014 0805   HCT 40.6 08/28/2014 0805   PLT 219 08/28/2014 0805   MCV 92.1 08/28/2014 0805   MCH 31.1 08/28/2014 0805   MCHC 33.7 08/28/2014 0805   RDW 13.5 08/28/2014 0805   LYMPHSABS 2.8 08/28/2014 0805   MONOABS 0.7 08/28/2014 0805   EOSABS 0.2 08/28/2014 0805   BASOSABS 0.0 08/28/2014 0805   CMP     Component Value Date/Time   NA 135 08/30/2014 1142   K 3.9 08/30/2014 1142   CL 107 08/30/2014 1142   CO2 21 08/30/2014 1142   GLUCOSE 178* 08/30/2014 1142   BUN 8 08/30/2014 1142   CREATININE 0.52 08/30/2014 1142   CALCIUM 9.0 08/30/2014 1142   PROT 6.0 08/30/2014 1142   ALBUMIN 3.0* 08/30/2014 1142   AST 18 08/30/2014 1142   ALT 13 08/30/2014 1142   ALKPHOS 81 08/30/2014 1142   BILITOT 0.3 08/30/2014 1142   GFRNONAA >90 08/30/2014 1142   GFRAA >90 08/30/2014 1142      FHTs 130-140 Reactive / category I and No contractions, no decels  GBS neg  4/20 Sono 4/25 c/w AGA, vtx, nl AFI Normal 3gtt 3/31 BS on CMP 178 BMZ 3/8, 3/9- booster dose 4/14 2pp < 120  ( one elevated)  A: 32 wk 2d IUP with AGA, low normal but stable AFI PPROM - no evidence of chorio, rpt  GBS neg Cervical Insufficiency s/p rescue cerclage Remote history of Epilepsy- no meds ? CHTN - stable BP, no s/s PEC History of one abnl on 3gtt, Random BS stable.  P: Monitor s/s PEC Latency abx completed No tocolysis if labors BMZ complete Weekly 17P recommended until 34 wks- pt declined  Weekly AFI Bedrest with BRP, SCDs NICU consult done Continue NST qd Continue inpatient management

## 2014-09-08 ENCOUNTER — Inpatient Hospital Stay (HOSPITAL_COMMUNITY): Payer: 59

## 2014-09-08 MED ORDER — ONDANSETRON HCL 4 MG PO TABS
4.0000 mg | ORAL_TABLET | Freq: Once | ORAL | Status: AC
  Administered 2014-09-08: 4 mg via ORAL
  Filled 2014-09-08: qty 1

## 2014-09-08 NOTE — Progress Notes (Signed)
Ur chart review completed.  

## 2014-09-08 NOTE — Progress Notes (Signed)
Patient ID: Brooke [name], female   DOB: Dec 01, 1985, 29 y.o.   MRN: [id number] HD #[id number] Cervical Insufficiency 32wks 3d with Rescue cerclage PPROM with stable AFI  S: No complaints. No bleeding or foul odor per vagina. Continues to have fluid leakage.  Feels occ pressure. Good FM. No CP or SOB or HA  Mild right fundal tenderness now improved, No epigastric pain Occ cramping, no contractions noted  O: BP 120/64 mmHg  Pulse 87  Temp(Src) 98.3 F (36.8 C) (Oral)  Resp 20  Ht 5\' 5"  (1.651 m)  Wt 105.552 kg (232 lb 11.2 oz)  BMI 38.72 kg/m2  SpO2 96%  LMP 12/12/2013 (LMP Unknown)  Tm 98.8  A&Ox3 WDWN. NAD Lungs: CTA , No WRR CV: RRR Abd: Gravid, NT, Fundus appropriate with dates, soft, non-tender uterus,  No CVAT Pelvic: deferred Extr- neg c/c/e, SCDs intact Skin: no lesions Neuro : intact  CBC    Component Value Date/Time   WBC 9.6 08/28/2014 0805   RBC 4.41 08/28/2014 0805   HGB 13.7 08/28/2014 0805   HCT 40.6 08/28/2014 0805   PLT 219 08/28/2014 0805   MCV 92.1 08/28/2014 0805   MCH 31.1 08/28/2014 0805   MCHC 33.7 08/28/2014 0805   RDW 13.5 08/28/2014 0805   LYMPHSABS 2.8 08/28/2014 0805   MONOABS 0.7 08/28/2014 0805   EOSABS 0.2 08/28/2014 0805   BASOSABS 0.0 08/28/2014 0805   CMP     Component Value Date/Time   NA 135 08/30/2014 1142   K 3.9 08/30/2014 1142   CL 107 08/30/2014 1142   CO2 21 08/30/2014 1142   GLUCOSE 178* 08/30/2014 1142   BUN 8 08/30/2014 1142   CREATININE 0.52 08/30/2014 1142   CALCIUM 9.0 08/30/2014 1142   PROT 6.0 08/30/2014 1142   ALBUMIN 3.0* 08/30/2014 1142   AST 18 08/30/2014 1142   ALT 13 08/30/2014 1142   ALKPHOS 81 08/30/2014 1142   BILITOT 0.3 08/30/2014 1142   GFRNONAA >90 08/30/2014 1142   GFRAA >90 08/30/2014 1142      FHTs 120-130 Reactive / category I and No contractions, no decels  GBS neg  4/20 Sono 4/25 c/w AGA, vtx, nl AFI Nl AFI 5/2, cephalic Normal 3gtt 3/31 BS on CMP 178 BMZ 3/8, 3/9-  booster dose 4/14 2pp < 120 ( one elevated), FBS 78  A: 32 wk 3d IUP with AGA, low normal but stable AFI PPROM - no evidence of chorio, rpt  GBS neg Cervical Insufficiency s/p rescue cerclage Remote history of Epilepsy- no meds ? CHTN - stable BP, no s/s PEC History of one abnl on 3gtt, Random BS stable.  P: Monitor s/s PEC Latency abx completed No tocolysis if labors BMZ complete Weekly 17P recommended until 34 wks- pt declined  Weekly AFI Bedrest with BRP, SCDs NICU consult done Continue NST qd Continue inpatient management

## 2014-09-09 LAB — TYPE AND SCREEN
ABO/RH(D): B POS
ABO/RH(D): B POS
Antibody Screen: NEGATIVE
Antibody Screen: NEGATIVE

## 2014-09-09 NOTE — Progress Notes (Signed)
Patient ID: Brooke [name], female   DOB: March 28, 1986, 29 y.o.   MRN: [id number] HD #[id number] Cervical Insufficiency 32wks 4d with Rescue cerclage PPROM with stable AFI  S: No complaints. No bleeding or foul odor per vagina. Continues to have fluid leakage.  Feels occ pressure. Good FM. No CP or SOB or HA  Mild right fundal tenderness now improved, No epigastric pain Occ cramping, no contractions noted  O: BP 127/79 mmHg  Pulse 86  Temp(Src) 98.5 F (36.9 C) (Oral)  Resp 18  Ht 5\' 5"  (1.651 m)  Wt 105.552 kg (232 lb 11.2 oz)  BMI 38.72 kg/m2  SpO2 96%  LMP 12/12/2013 (LMP Unknown)  Tm 98.5  A&Ox3 WDWN. NAD Lungs: CTA , No WRR CV: RRR Abd: Gravid, NT, Fundus appropriate with dates, soft, non-tender uterus,  No CVAT Pelvic: deferred Extr- neg c/c/e, SCDs intact Skin: no lesions Neuro : intact  CBC    Component Value Date/Time   WBC 9.6 08/28/2014 0805   RBC 4.41 08/28/2014 0805   HGB 13.7 08/28/2014 0805   HCT 40.6 08/28/2014 0805   PLT 219 08/28/2014 0805   MCV 92.1 08/28/2014 0805   MCH 31.1 08/28/2014 0805   MCHC 33.7 08/28/2014 0805   RDW 13.5 08/28/2014 0805   LYMPHSABS 2.8 08/28/2014 0805   MONOABS 0.7 08/28/2014 0805   EOSABS 0.2 08/28/2014 0805   BASOSABS 0.0 08/28/2014 0805   CMP     Component Value Date/Time   NA 135 08/30/2014 1142   K 3.9 08/30/2014 1142   CL 107 08/30/2014 1142   CO2 21 08/30/2014 1142   GLUCOSE 178* 08/30/2014 1142   BUN 8 08/30/2014 1142   CREATININE 0.52 08/30/2014 1142   CALCIUM 9.0 08/30/2014 1142   PROT 6.0 08/30/2014 1142   ALBUMIN 3.0* 08/30/2014 1142   AST 18 08/30/2014 1142   ALT 13 08/30/2014 1142   ALKPHOS 81 08/30/2014 1142   BILITOT 0.3 08/30/2014 1142   GFRNONAA >90 08/30/2014 1142   GFRAA >90 08/30/2014 1142      FHTs 120-130 Reactive / category I and No contractions, no decels  GBS neg  4/20 Sono 4/25 c/w AGA, vtx, nl AFI Nl AFI 5/2, cephalic Normal 3gtt 3/31 BS on CMP 178 BMZ 3/8, 3/9-  booster dose 4/14 2pp < 120 ( one elevated), FBS 78  A: 32 wk 4d IUP with AGA, low normal but stable AFI PPROM - no evidence of chorio, rpt  GBS neg Cervical Insufficiency s/p rescue cerclage Remote history of Epilepsy- no meds ? CHTN - stable BP, no s/s PEC History of one abnl on 3gtt, Random BS stable.  P: Monitor s/s PEC Latency abx completed No tocolysis if labors BMZ complete Weekly 17P recommended until 34 wks- pt declined  Weekly AFI Bedrest with BRP, SCDs NICU consult done Continue NST qd Continue inpatient management

## 2014-09-09 NOTE — Progress Notes (Signed)
Pt refused full 1 hr FHR monitoring - RN was able to monitor for 45 min. Pt desired monitors to be removed early due to feeling "crampy".

## 2014-09-10 NOTE — Progress Notes (Signed)
Patient ID: Brooke [name], female   DOB: 1986/02/27, 29 y.o.   MRN: [id number] HD #[id number] Cervical Insufficiency 32wks 5d with Rescue cerclage PPROM with stable AFI  S: No complaints. No bleeding or foul odor per vagina. Continues to have fluid leakage.  Feels occ pressure. Good FM. No CP or SOB or HA  Mild right fundal tenderness now improved, No epigastric pain Occ cramping, no contractions noted  O: BP 133/76 mmHg  Pulse 91  Temp(Src) 98.5 F (36.9 C) (Oral)  Resp 18  Ht 5\' 5"  (1.651 m)  Wt 105.552 kg (232 lb 11.2 oz)  BMI 38.72 kg/m2  SpO2 96%  LMP 12/12/2013 (LMP Unknown)  Tm 98.5  A&Ox3 WDWN. NAD Lungs: CTA , No WRR CV: RRR Abd: Gravid, NT, Fundus appropriate with dates, soft, non-tender uterus,  No CVAT Pelvic: deferred Extr- neg c/c/e, SCDs intact Skin: no lesions Neuro : intact  CBC    Component Value Date/Time   WBC 9.6 08/28/2014 0805   RBC 4.41 08/28/2014 0805   HGB 13.7 08/28/2014 0805   HCT 40.6 08/28/2014 0805   PLT 219 08/28/2014 0805   MCV 92.1 08/28/2014 0805   MCH 31.1 08/28/2014 0805   MCHC 33.7 08/28/2014 0805   RDW 13.5 08/28/2014 0805   LYMPHSABS 2.8 08/28/2014 0805   MONOABS 0.7 08/28/2014 0805   EOSABS 0.2 08/28/2014 0805   BASOSABS 0.0 08/28/2014 0805   CMP     Component Value Date/Time   NA 135 08/30/2014 1142   K 3.9 08/30/2014 1142   CL 107 08/30/2014 1142   CO2 21 08/30/2014 1142   GLUCOSE 178* 08/30/2014 1142   BUN 8 08/30/2014 1142   CREATININE 0.52 08/30/2014 1142   CALCIUM 9.0 08/30/2014 1142   PROT 6.0 08/30/2014 1142   ALBUMIN 3.0* 08/30/2014 1142   AST 18 08/30/2014 1142   ALT 13 08/30/2014 1142   ALKPHOS 81 08/30/2014 1142   BILITOT 0.3 08/30/2014 1142   GFRNONAA >90 08/30/2014 1142   GFRAA >90 08/30/2014 1142      FHTs 120-130 Reactive / category I and No contractions, no decels  GBS neg  4/20 Sono 4/25 c/w AGA, vtx, nl AFI Nl AFI 5/2, cephalic Normal 3gtt 3/31 BS on CMP 178 BMZ 3/8, 3/9-  booster dose 4/14 2pp < 120 ( one elevated), FBS 78  A: 32 wk 5d IUP with AGA, low normal but stable AFI PPROM - no evidence of chorio, rpt  GBS neg Cervical Insufficiency s/p rescue cerclage Remote history of Epilepsy- no meds ? CHTN - stable BP, no s/s PEC History of one abnl on 3gtt, Random BS stable.  P: Monitor s/s PEC Latency abx completed No tocolysis if labors BMZ complete Weekly 17P recommended until 34 wks- pt declined  Weekly AFI Bedrest with BRP, SCDs NICU consult done Continue NST qd Continue inpatient management

## 2014-09-11 NOTE — Progress Notes (Signed)
Ur chart review completed.  

## 2014-09-11 NOTE — Progress Notes (Signed)
Patient ID: Brooke [name], female   DOB: March 21, 1986, 29 y.o.   MRN: [id number] HD #[id number] Cervical Insufficiency 32wks 6d with Rescue cerclage PPROM with stable AFI  S: No complaints. No bleeding or foul odor per vagina. Continues to have fluid leakage.  Feels occ pressure. Good FM. No CP or SOB or HA  Mild right fundal tenderness now improved, No epigastric pain Occ cramping, no contractions noted  O: BP 139/83 mmHg  Pulse 92  Temp(Src) 97.9 F (36.6 C) (Oral)  Resp 18  Ht 5\' 5"  (1.651 m)  Wt 107.956 kg (238 lb)  BMI 39.61 kg/m2  SpO2 96%  LMP 12/12/2013 (LMP Unknown)  Tm 98.5  A&Ox3 WDWN. NAD Lungs: CTA , No WRR CV: RRR Abd: Gravid, NT, Fundus appropriate with dates, soft, non-tender uterus,  No CVAT Pelvic: deferred Extr- neg c/c/e, SCDs intact Skin: no lesions Neuro : intact  CBC    Component Value Date/Time   WBC 9.6 08/28/2014 0805   RBC 4.41 08/28/2014 0805   HGB 13.7 08/28/2014 0805   HCT 40.6 08/28/2014 0805   PLT 219 08/28/2014 0805   MCV 92.1 08/28/2014 0805   MCH 31.1 08/28/2014 0805   MCHC 33.7 08/28/2014 0805   RDW 13.5 08/28/2014 0805   LYMPHSABS 2.8 08/28/2014 0805   MONOABS 0.7 08/28/2014 0805   EOSABS 0.2 08/28/2014 0805   BASOSABS 0.0 08/28/2014 0805   CMP     Component Value Date/Time   NA 135 08/30/2014 1142   K 3.9 08/30/2014 1142   CL 107 08/30/2014 1142   CO2 21 08/30/2014 1142   GLUCOSE 178* 08/30/2014 1142   BUN 8 08/30/2014 1142   CREATININE 0.52 08/30/2014 1142   CALCIUM 9.0 08/30/2014 1142   PROT 6.0 08/30/2014 1142   ALBUMIN 3.0* 08/30/2014 1142   AST 18 08/30/2014 1142   ALT 13 08/30/2014 1142   ALKPHOS 81 08/30/2014 1142   BILITOT 0.3 08/30/2014 1142   GFRNONAA >90 08/30/2014 1142   GFRAA >90 08/30/2014 1142      FHTs 120-130 Reactive / category I and No contractions, no decels  GBS neg  4/20 Sono 4/25 c/w AGA, vtx, nl AFI Nl AFI 5/2, cephalic Normal 3gtt 3/31 BS on CMP 178 BMZ 3/8, 3/9- booster dose  4/14 2pp < 120 ( one elevated), FBS 78  A: 32 wk 6d IUP with AGA, low normal but stable AFI PPROM - no evidence of chorio, rpt  GBS neg Cervical Insufficiency s/p rescue cerclage Remote history of Epilepsy- no meds ? CHTN - stable BP, no s/s PEC History of one abnl on 3gtt, Random BS stable.  P: Monitor s/s PEC Latency abx completed No tocolysis if labors BMZ complete Weekly 17P recommended until 34 wks- pt declined  Weekly AFI Bedrest with BRP, SCDs NICU consult done Continue NST qd Continue inpatient management

## 2014-09-12 LAB — CBC
HCT: 37.3 % (ref 36.0–46.0)
Hemoglobin: 12.9 g/dL (ref 12.0–15.0)
MCH: 31.4 pg (ref 26.0–34.0)
MCHC: 34.6 g/dL (ref 30.0–36.0)
MCV: 90.8 fL (ref 78.0–100.0)
PLATELETS: 198 10*3/uL (ref 150–400)
RBC: 4.11 MIL/uL (ref 3.87–5.11)
RDW: 13.7 % (ref 11.5–15.5)
WBC: 9.3 10*3/uL (ref 4.0–10.5)

## 2014-09-12 NOTE — Progress Notes (Signed)
Patient ID: Brooke [name], female   DOB: 31-Jan-1986, 29 y.o.   MRN: [id number] HD #[id number] Cervical Insufficiency 33wks 0d with Rescue cerclage PPROM with stable AFI  S: No complaints. No bleeding or foul odor per vagina. Continues to have fluid leakage.  Feels occ pressure. Good FM. No CP or SOB or HA  Mild right fundal tenderness now improved, No epigastric pain Occ cramping, no contractions noted  O: BP 153/92 mmHg  Pulse 91  Temp(Src) 98.4 F (36.9 C) (Oral)  Resp 20  Ht 5\' 5"  (1.651 m)  Wt 107.956 kg (238 lb)  BMI 39.61 kg/m2  SpO2 96%  LMP 12/12/2013 (LMP Unknown)  Tm 98.5  A&Ox3 WDWN. NAD Lungs: CTA , No WRR CV: RRR Abd: Gravid, NT, Fundus appropriate with dates, soft, non-tender uterus,  No CVAT Pelvic: deferred Extr- neg c/c/e, SCDs intact Skin: no lesions Neuro : intact  CBC    Component Value Date/Time   WBC 9.6 08/28/2014 0805   RBC 4.41 08/28/2014 0805   HGB 13.7 08/28/2014 0805   HCT 40.6 08/28/2014 0805   PLT 219 08/28/2014 0805   MCV 92.1 08/28/2014 0805   MCH 31.1 08/28/2014 0805   MCHC 33.7 08/28/2014 0805   RDW 13.5 08/28/2014 0805   LYMPHSABS 2.8 08/28/2014 0805   MONOABS 0.7 08/28/2014 0805   EOSABS 0.2 08/28/2014 0805   BASOSABS 0.0 08/28/2014 0805   CMP     Component Value Date/Time   NA 135 08/30/2014 1142   K 3.9 08/30/2014 1142   CL 107 08/30/2014 1142   CO2 21 08/30/2014 1142   GLUCOSE 178* 08/30/2014 1142   BUN 8 08/30/2014 1142   CREATININE 0.52 08/30/2014 1142   CALCIUM 9.0 08/30/2014 1142   PROT 6.0 08/30/2014 1142   ALBUMIN 3.0* 08/30/2014 1142   AST 18 08/30/2014 1142   ALT 13 08/30/2014 1142   ALKPHOS 81 08/30/2014 1142   BILITOT 0.3 08/30/2014 1142   GFRNONAA >90 08/30/2014 1142   GFRAA >90 08/30/2014 1142      FHTs 120-130 Reactive / category I and No contractions, no decels  GBS neg  4/20 Sono 4/25 c/w AGA, vtx, nl AFI Nl AFI 5/2, cephalic Normal 3gtt 3/31 BS on CMP 178 BMZ 3/8, 3/9- booster dose  4/14 2pp < 120 ( one elevated), FBS 78  A: 33 wk 0d IUP with AGA, low normal but stable AFI PPROM - no evidence of chorio, rpt  GBS neg Cervical Insufficiency s/p rescue cerclage Remote history of Epilepsy- no meds ? CHTN - stable BP, no s/s PEC History of one abnl on 3gtt, Random BS stable.  P: Monitor s/s PEC Latency abx completed No tocolysis if labors BMZ complete Weekly 17P recommended until 34 wks- pt declined  Weekly AFI Bedrest with BRP, SCDs NICU consult done Continue NST qd Continue inpatient management

## 2014-09-13 NOTE — Progress Notes (Signed)
Patient ID: Brooke [name], female   DOB: 13-Oct-1985, 29 y.o.   MRN: [id number] HD #[id number] Cervical Insufficiency 33wks 1d with Rescue cerclage PPROM with stable AFI  S: No complaints. No bleeding or foul odor per vagina. Continues to have fluid leakage.  Feels occ pressure. Good FM. No CP or SOB or HA  Mild right fundal tenderness now improved, No epigastric pain Occ cramping, no contractions noted  O: BP 151/87 mmHg  Pulse 91  Temp(Src) 98.2 F (36.8 C) (Oral)  Resp 20  Ht 5\' 5"  (1.651 m)  Wt 107.956 kg (238 lb)  BMI 39.61 kg/m2  SpO2 96%  LMP 12/12/2013 (LMP Unknown)  Tm 98.5  A&Ox3 WDWN. NAD Lungs: CTA , No WRR CV: RRR Abd: Gravid, NT, Fundus appropriate with dates, soft, non-tender uterus,  No CVAT Pelvic: deferred Extr- neg c/c/e, SCDs intact Skin: no lesions Neuro : intact  CBC    Component Value Date/Time   WBC 9.3 09/12/2014 0830   RBC 4.11 09/12/2014 0830   HGB 12.9 09/12/2014 0830   HCT 37.3 09/12/2014 0830   PLT 198 09/12/2014 0830   MCV 90.8 09/12/2014 0830   MCH 31.4 09/12/2014 0830   MCHC 34.6 09/12/2014 0830   RDW 13.7 09/12/2014 0830   LYMPHSABS 2.8 08/28/2014 0805   MONOABS 0.7 08/28/2014 0805   EOSABS 0.2 08/28/2014 0805   BASOSABS 0.0 08/28/2014 0805   CMP     Component Value Date/Time   NA 135 08/30/2014 1142   K 3.9 08/30/2014 1142   CL 107 08/30/2014 1142   CO2 21 08/30/2014 1142   GLUCOSE 178* 08/30/2014 1142   BUN 8 08/30/2014 1142   CREATININE 0.52 08/30/2014 1142   CALCIUM 9.0 08/30/2014 1142   PROT 6.0 08/30/2014 1142   ALBUMIN 3.0* 08/30/2014 1142   AST 18 08/30/2014 1142   ALT 13 08/30/2014 1142   ALKPHOS 81 08/30/2014 1142   BILITOT 0.3 08/30/2014 1142   GFRNONAA >90 08/30/2014 1142   GFRAA >90 08/30/2014 1142      FHTs 120-130 Reactive / category I and No contractions, no decels  GBS neg  4/20 Sono 4/25 c/w AGA, vtx, nl AFI Nl AFI 5/2, cephalic Normal 3gtt 3/31 BS on CMP 178 BMZ 3/8, 3/9- booster dose  4/14 2pp < 120 ( one elevated), FBS 78  A: 33 wk 1d IUP with AGA, low normal but stable AFI PPROM - no evidence of chorio, rpt  GBS neg Cervical Insufficiency s/p rescue cerclage Remote history of Epilepsy- no meds ? CHTN - stable BP, no s/s PEC History of one abnl on 3gtt, Random BS stable.  P: Monitor s/s PEC Rpt AFI Monday Latency abx completed No tocolysis if labors BMZ complete Weekly 17P recommended until 34 wks- pt declined  Weekly AFI Bedrest with BRP, SCDs NICU consult done Continue NST qd Continue inpatient management

## 2014-09-14 NOTE — Progress Notes (Signed)
Patient ID: Brooke [name], female   DOB: 28-Oct-1985, 29 y.o.   MRN: [id number] HD #[id number] Cervical Insufficiency 33wks 2d with Rescue cerclage PPROM with stable AFI  S: No complaints. No bleeding or foul odor per vagina. Continues to have fluid leakage.  Feels occ pressure. Good FM. No CP or SOB or HA  Mild right fundal tenderness now improved, No epigastric pain Occ cramping, no contractions noted  O: BP 129/74 mmHg  Pulse 93  Temp(Src) 97.9 F (36.6 C) (Oral)  Resp 18  Ht 5\' 5"  (1.651 m)  Wt 107.956 kg (238 lb)  BMI 39.61 kg/m2  SpO2 96%  LMP 12/12/2013 (LMP Unknown)  Tm 98.6  A&Ox3 WDWN. NAD Lungs: CTA , No WRR CV: RRR Abd: Gravid, NT, Fundus appropriate with dates, soft, non-tender uterus,  No CVAT Pelvic: deferred Extr- neg c/c/e, SCDs intact Skin: no lesions Neuro : intact  CBC    Component Value Date/Time   WBC 9.3 09/12/2014 0830   RBC 4.11 09/12/2014 0830   HGB 12.9 09/12/2014 0830   HCT 37.3 09/12/2014 0830   PLT 198 09/12/2014 0830   MCV 90.8 09/12/2014 0830   MCH 31.4 09/12/2014 0830   MCHC 34.6 09/12/2014 0830   RDW 13.7 09/12/2014 0830   LYMPHSABS 2.8 08/28/2014 0805   MONOABS 0.7 08/28/2014 0805   EOSABS 0.2 08/28/2014 0805   BASOSABS 0.0 08/28/2014 0805   CMP     Component Value Date/Time   NA 135 08/30/2014 1142   K 3.9 08/30/2014 1142   CL 107 08/30/2014 1142   CO2 21 08/30/2014 1142   GLUCOSE 178* 08/30/2014 1142   BUN 8 08/30/2014 1142   CREATININE 0.52 08/30/2014 1142   CALCIUM 9.0 08/30/2014 1142   PROT 6.0 08/30/2014 1142   ALBUMIN 3.0* 08/30/2014 1142   AST 18 08/30/2014 1142   ALT 13 08/30/2014 1142   ALKPHOS 81 08/30/2014 1142   BILITOT 0.3 08/30/2014 1142   GFRNONAA >90 08/30/2014 1142   GFRAA >90 08/30/2014 1142      FHTs 120-130 Reactive / category I and rare contractions, no decels  GBS neg  4/20 Sono 4/25 c/w AGA, vtx, nl AFI Nl AFI 5/2, cephalic Normal 3gtt 3/31 BS on CMP 178 BMZ 3/8, 3/9- booster dose  4/14 2pp < 120 ( one elevated), FBS 78  A: 33 wk 2d IUP with AGA, low normal but stable AFI Presumed PPROM - no evidence of chorio, rpt  GBS neg, neg spec exam, pos amnisure Cervical Insufficiency s/p rescue cerclage Remote history of Epilepsy- no meds ? CHTN - stable BP, no s/s PEC History of one abnl on 3gtt, Random BS stable.  P: Monitor s/s PEC Rpt AFI Monday Latency abx completed No tocolysis if labors BMZ complete Weekly 17P recommended until 34 wks- pt declined  Bedrest with BRP, SCDs NICU consult done Continue NST qd Continue inpatient management

## 2014-09-15 ENCOUNTER — Inpatient Hospital Stay (HOSPITAL_COMMUNITY): Payer: 59

## 2014-09-15 LAB — TYPE AND SCREEN
ABO/RH(D): B POS
ABO/RH(D): B POS
ANTIBODY SCREEN: NEGATIVE
Antibody Screen: NEGATIVE

## 2014-09-15 NOTE — Progress Notes (Signed)
Ur chart review completed.  

## 2014-09-15 NOTE — Progress Notes (Signed)
Going on W/C ride, pt's spouse with pt.

## 2014-09-15 NOTE — Progress Notes (Signed)
Patient ID: Brooke [name], female   DOB: [birth year], 29 y.o.   MRN: [id number] HD #[id number] Cervical Insufficiency 33wks 3d with Rescue cerclage PPROM with stable AFI  S: No complaints. No bleeding or foul odor per vagina. Continues to have fluid leakage.  Feels occ pressure. Good FM. No CP or SOB or HA  Mild right fundal tenderness now improved, No epigastric pain Occ cramping, no contractions noted  O: BP 159/90 mmHg  Pulse 93  Temp(Src) 98.5 F (36.9 C) (Oral)  Resp 18  Ht 5\' 5"  (1.651 m)  Wt 238 lb (107.956 kg)  BMI 39.61 kg/m2  SpO2 96%  LMP 12/12/2013 (LMP Unknown)  Tm 98.6  A&Ox3 WDWN. NAD Lungs: CTA , No WRR CV: RRR Abd: Gravid, NT, Fundus appropriate with dates, soft, non-tender uterus,  No CVAT Pelvic: deferred Extr- neg c/c/e, SCDs intact Skin: no lesions Neuro : intact  CBC    Component Value Date/Time   WBC 9.3 09/12/2014 0830   RBC 4.11 09/12/2014 0830   HGB 12.9 09/12/2014 0830   HCT 37.3 09/12/2014 0830   PLT 198 09/12/2014 0830   MCV 90.8 09/12/2014 0830   MCH 31.4 09/12/2014 0830   MCHC 34.6 09/12/2014 0830   RDW 13.7 09/12/2014 0830   LYMPHSABS 2.8 08/28/2014 0805   MONOABS 0.7 08/28/2014 0805   EOSABS 0.2 08/28/2014 0805   BASOSABS 0.0 08/28/2014 0805   CMP     Component Value Date/Time   NA 135 08/30/2014 1142   K 3.9 08/30/2014 1142   CL 107 08/30/2014 1142   CO2 21 08/30/2014 1142   GLUCOSE 178* 08/30/2014 1142   BUN 8 08/30/2014 1142   CREATININE 0.52 08/30/2014 1142   CALCIUM 9.0 08/30/2014 1142   PROT 6.0 08/30/2014 1142   ALBUMIN 3.0* 08/30/2014 1142   AST 18 08/30/2014 1142   ALT 13 08/30/2014 1142   ALKPHOS 81 08/30/2014 1142   BILITOT 0.3 08/30/2014 1142   GFRNONAA >90 08/30/2014 1142   GFRAA >90 08/30/2014 1142      FHTs 120-130 Reactive / category I and rare contractions, no decels  GBS neg  4/20 Sono 4/25 c/w AGA, vtx, nl AFI Nl AFI(10.7) 5/9, cephalic Normal 3gtt 3/31 BS on CMP 178 BMZ 3/8, 3/9-  booster dose 4/14 2pp < 120 ( one elevated), FBS 78  A: 33 wk 3d IUP with AGA, low normal but stable AFI Presumed PPROM - no evidence of chorio, rpt  GBS neg, neg spec exam, pos amnisure Cervical Insufficiency s/p rescue cerclage Remote history of Epilepsy- no meds ? CHTN - stable BP, no s/s PEC History of one abnl on 3gtt, Random BS stable.  P: Monitor s/s PEC Latency abx completed No tocolysis if labors BMZ complete Weekly 17P recommended until 34 wks- pt declined  Bedrest with BRP, SCDs NICU consult done Continue NST qd Continue inpatient management Deliver at 34-35 weeks

## 2014-09-16 NOTE — Progress Notes (Signed)
Patient ID: Brooke [name], female   DOB: 1986-01-23, 29 y.o.   MRN: [id number] HD #[id number] Cervical Insufficiency 33wks 4d with Rescue cerclage PPROM with stable AFI  S: No complaints. No bleeding or foul odor per vagina. Continues to have minimal fluid leakage.  Feels occ pressure. Good FM. No CP or SOB or HA  Mild right fundal tenderness now resolved, No epigastric pain Occ cramping, no contractions noted  O: BP 164/94 mmHg  Pulse 100  Temp(Src) 97.2 F (36.2 C) (Oral)  Resp 20  Ht 5\' 5"  (1.651 m)  Wt 107.956 kg (238 lb)  BMI 39.61 kg/m2  SpO2 96%  LMP 12/12/2013 (LMP Unknown)  Tm 98.6  A&Ox3 WDWN. NAD Lungs: CTA , No WRR CV: RRR Abd: Gravid, NT, Fundus appropriate with dates, soft, non-tender uterus,  No CVAT Pelvic: deferred Extr- neg c/c/e, SCDs intact Skin: no lesions Neuro : intact  CBC    Component Value Date/Time   WBC 9.3 09/12/2014 0830   RBC 4.11 09/12/2014 0830   HGB 12.9 09/12/2014 0830   HCT 37.3 09/12/2014 0830   PLT 198 09/12/2014 0830   MCV 90.8 09/12/2014 0830   MCH 31.4 09/12/2014 0830   MCHC 34.6 09/12/2014 0830   RDW 13.7 09/12/2014 0830   LYMPHSABS 2.8 08/28/2014 0805   MONOABS 0.7 08/28/2014 0805   EOSABS 0.2 08/28/2014 0805   BASOSABS 0.0 08/28/2014 0805   CMP     Component Value Date/Time   NA 135 08/30/2014 1142   K 3.9 08/30/2014 1142   CL 107 08/30/2014 1142   CO2 21 08/30/2014 1142   GLUCOSE 178* 08/30/2014 1142   BUN 8 08/30/2014 1142   CREATININE 0.52 08/30/2014 1142   CALCIUM 9.0 08/30/2014 1142   PROT 6.0 08/30/2014 1142   ALBUMIN 3.0* 08/30/2014 1142   AST 18 08/30/2014 1142   ALT 13 08/30/2014 1142   ALKPHOS 81 08/30/2014 1142   BILITOT 0.3 08/30/2014 1142   GFRNONAA >90 08/30/2014 1142   GFRAA >90 08/30/2014 1142      FHTs 130 Reactive / category I and rare contractions, no decels  GBS neg  4/20 Sono 4/25 c/w AGA, vtx, nl AFI Nl AFI(10.7) 5/9, cephalic Normal 3gtt 3/31 BS on CMP 178 BMZ 3/8, 3/9-  booster dose 4/14 2pp < 120 ( one elevated), FBS 78  A: 33 wk 4d IUP with AGA, low normal but stable AFI Presumed PPROM - no evidence of chorio, rpt  GBS neg, neg spec exam, pos amnisure Cervical Insufficiency s/p rescue cerclage Remote history of Epilepsy- no meds ? CHTN - stable BP, no s/s PEC History of one abnl on 3gtt, Random BS stable.  P: Monitor s/s PEC Latency abx completed No tocolysis if labors BMZ complete Weekly 17P recommended until 34 wks- pt declined  Bedrest with BRP, SCDs NICU consult done Continue NST qd Continue inpatient management Deliver at 34-35 weeks

## 2014-09-17 NOTE — Progress Notes (Signed)
Patient ID: Brooke [name], female   DOB: 29-Oct-1985, [age] y.o.   MRN: [id number] HD #[id number] Cervical Insufficiency 33wks 5d with Rescue cerclage PPROM with stable AFI  S: No complaints. No bleeding or foul odor per vagina. Continues to have minimal fluid leakage.  Feels occ pressure. Good FM. No CP or SOB or HA  Mild right fundal tenderness now resolved, No epigastric pain Occ cramping, no contractions noted  O: BP 147/86 mmHg  Pulse 108  Temp(Src) 98.4 F (36.9 C) (Oral)  Resp 18  Ht 5\' 5"  (1.651 m)  Wt 107.956 kg (238 lb)  BMI 39.61 kg/m2  SpO2 96%  LMP 12/12/2013 (LMP Unknown)  Tm 98.6  A&Ox3 WDWN. NAD Lungs: CTA , No WRR CV: RRR Abd: Gravid, NT, Fundus appropriate with dates, soft, non-tender uterus,  No CVAT Pelvic: deferred Extr- neg c/c/e, SCDs intact Skin: no lesions Neuro : intact  CBC    Component Value Date/Time   WBC 9.3 09/12/2014 0830   RBC 4.11 09/12/2014 0830   HGB 12.9 09/12/2014 0830   HCT 37.3 09/12/2014 0830   PLT 198 09/12/2014 0830   MCV 90.8 09/12/2014 0830   MCH 31.4 09/12/2014 0830   MCHC 34.6 09/12/2014 0830   RDW 13.7 09/12/2014 0830   LYMPHSABS 2.8 08/28/2014 0805   MONOABS 0.7 08/28/2014 0805   EOSABS 0.2 08/28/2014 0805   BASOSABS 0.0 08/28/2014 0805   CMP     Component Value Date/Time   NA 135 08/30/2014 1142   K 3.9 08/30/2014 1142   CL 107 08/30/2014 1142   CO2 21 08/30/2014 1142   GLUCOSE 178* 08/30/2014 1142   BUN 8 08/30/2014 1142   CREATININE 0.52 08/30/2014 1142   CALCIUM 9.0 08/30/2014 1142   PROT 6.0 08/30/2014 1142   ALBUMIN 3.0* 08/30/2014 1142   AST 18 08/30/2014 1142   ALT 13 08/30/2014 1142   ALKPHOS 81 08/30/2014 1142   BILITOT 0.3 08/30/2014 1142   GFRNONAA >90 08/30/2014 1142   GFRAA >90 08/30/2014 1142      FHTs 130 Reactive / category I and rare contractions, no decels  GBS neg  4/20 Son[facility] 4/25 c/w AGA, vtx, nl AFI Nl AFI(10.7) 5/9, cephalic Normal 3gtt 3/31 BS on CMP 178 BMZ 3/8, 3/9-  booster dose 4/14 2pp < 120 ( one elevated), FBS 78  A: 33 wk 5d IUP with AGA, low normal but stable AFI Presumed PPROM - no evidence of chorio, rpt  GBS neg, neg spec exam, pos amnisure Cervical Insufficiency s/p rescue cerclage Remote history of Epilepsy- no meds ? CHTN - stable BP, no s/s PEC History of one abnl on 3gtt, Random BS stable.  P: Monitor s/s PEC- rpt labs tomorrow Latency abx completed No tocolysis if labors BMZ complete Weekly 17P recommended until 34 wks- pt declined  Bedrest with BRP, SCDs NICU consult done Continue NST qd Continue inpatient management IOL 5/16 am

## 2014-09-18 LAB — CBC
HCT: 40 % (ref 36.0–46.0)
Hemoglobin: 14.2 g/dL (ref 12.0–15.0)
MCH: 31.6 pg (ref 26.0–34.0)
MCHC: 35.5 g/dL (ref 30.0–36.0)
MCV: 88.9 fL (ref 78.0–100.0)
PLATELETS: 208 10*3/uL (ref 150–400)
RBC: 4.5 MIL/uL (ref 3.87–5.11)
RDW: 13.3 % (ref 11.5–15.5)
WBC: 9.3 10*3/uL (ref 4.0–10.5)

## 2014-09-18 LAB — COMPREHENSIVE METABOLIC PANEL
ALBUMIN: 3.3 g/dL — AB (ref 3.5–5.0)
ALK PHOS: 99 U/L (ref 38–126)
ALT: 12 U/L — AB (ref 14–54)
ANION GAP: 10 (ref 5–15)
AST: 18 U/L (ref 15–41)
BUN: 7 mg/dL (ref 6–20)
CO2: 20 mmol/L — ABNORMAL LOW (ref 22–32)
Calcium: 9.2 mg/dL (ref 8.9–10.3)
Chloride: 107 mmol/L (ref 101–111)
Creatinine, Ser: 0.54 mg/dL (ref 0.44–1.00)
GFR calc Af Amer: >60 mL/min (ref >60)
GFR calc non Af Amer: >60 mL/min (ref >60)
GLUCOSE: 91 mg/dL (ref 65–99)
POTASSIUM: 3.7 mmol/L (ref 3.5–5.1)
Sodium: 137 mmol/L (ref 135–145)
TOTAL PROTEIN: 6.4 g/dL — AB (ref 6.5–8.1)
Total Bilirubin: 0.3 mg/dL (ref 0.3–1.2)

## 2014-09-18 LAB — TYPE AND SCREEN
ABO/RH(D): B POS
ANTIBODY SCREEN: NEGATIVE

## 2014-09-18 NOTE — Progress Notes (Signed)
Patient ID: Brooke [name], female   DOB: [birth year], 29 y.o.   MRN: [id number] HD #[id number] Cervical Insufficiency 33wks 6d with Rescue cerclage PPROM with stable AFI  S: No complaints. No bleeding or foul odor per vagina. Continues to have minimal fluid leakage.  Feels occ pressure. Good FM. No CP or SOB or HA  Mild right fundal tenderness now resolved, No epigastric pain Occ cramping, no contractions noted  O: BP 135/72 mmHg  Pulse 91  Temp(Src) 98.5 F (36.9 C) (Oral)  Resp 20  Ht 5\' 5"  (1.651 m)  Wt 108.41 kg (239 lb)  BMI 39.77 kg/m2  SpO2 96%  LMP 12/12/2013 (LMP Unknown)  Tm 98.6  A&Ox3 WDWN. NAD Lungs: CTA , No WRR CV: RRR Abd: Gravid, NT, Fundus appropriate with dates, soft, non-tender uterus,  No CVAT Pelvic: deferred Extr- neg c/c/e, SCDs intact Skin: no lesions Neuro : intact  CBC    Component Value Date/Time   WBC 9.3 09/12/2014 0830   RBC 4.11 09/12/2014 0830   HGB 12.9 09/12/2014 0830   HCT 37.3 09/12/2014 0830   PLT 198 09/12/2014 0830   MCV 90.8 09/12/2014 0830   MCH 31.4 09/12/2014 0830   MCHC 34.6 09/12/2014 0830   RDW 13.7 09/12/2014 0830   LYMPHSABS 2.8 08/28/2014 0805   MONOABS 0.7 08/28/2014 0805   EOSABS 0.2 08/28/2014 0805   BASOSABS 0.0 08/28/2014 0805   CMP     Component Value Date/Time   NA 135 08/30/2014 1142   K 3.9 08/30/2014 1142   CL 107 08/30/2014 1142   CO2 21 08/30/2014 1142   GLUCOSE 178* 08/30/2014 1142   BUN 8 08/30/2014 1142   CREATININE 0.52 08/30/2014 1142   CALCIUM 9.0 08/30/2014 1142   PROT 6.0 08/30/2014 1142   ALBUMIN 3.0* 08/30/2014 1142   AST 18 08/30/2014 1142   ALT 13 08/30/2014 1142   ALKPHOS 81 08/30/2014 1142   BILITOT 0.3 08/30/2014 1142   GFRNONAA >90 08/30/2014 1142   GFRAA >90 08/30/2014 1142      FHTs 130 Reactive / category I and rare contractions, no decels  GBS neg  4/20 Sono 4/25 c/w AGA, vtx, nl AFI Nl AFI(10.7) 5/9, cephalic Normal 3gtt 3/31 BS on CMP 178 BMZ 3/8, 3/9-  booster dose 4/14 2pp < 120 ( one elevated), FBS 78  A: 33 wk 6d IUP with AGA, low normal but stable AFI Presumed PPROM - no evidence of chorio, rpt  GBS neg, neg spec exam, pos amnisure Cervical Insufficiency s/p rescue cerclage Remote history of Epilepsy- no meds ? CHTN - stable BP, no s/s PEC History of one abnl on 3gtt, Random BS stable.  P: Monitor s/s PEC- rpt labs tomorrow Latency abx completed No tocolysis if labors BMZ complete Weekly 17P recommended until 34 wks- pt declined  Bedrest with BRP, SCDs NICU consult done Continue NST qd Continue inpatient management IOL 5/16 am

## 2014-09-18 NOTE — Progress Notes (Signed)
Ur chart review completed.  

## 2014-09-19 NOTE — Progress Notes (Signed)
PPROM  Cerclage  S: no complaint Active fetus Leaking fluid. Denies ctx O:  Filed Vitals:   09/18/14 1623 09/18/14 2055 09/19/14 0904 09/19/14 1207  BP: 146/79 164/84 144/87 141/92  Pulse: 107 96 108 92  Temp: 98.5 F (36.9 C) 99.2 F (37.3 C) 98.6 F (37 C) 98.5 F (36.9 C)  TempSrc: Oral Oral Oral Oral  Resp: 20 20 18 20   Height:      Weight:      SpO2:      lungs clear to A Cor RRR Abd gravid soft nontender Pad clear fluid extr no edema or calf tenderness  Tracing: baseline 125  (+) accel no ctx   CBC Latest Ref Rng 09/18/2014 09/12/2014 08/28/2014  WBC 4.0 - 10.5 K/uL 9.3 9.3 9.6  Hemoglobin 12.0 - 15.0 g/dL 09.814.2 11.912.9 14.713.7  Hematocrit 36.0 - 46.0 % 40.0 37.3 40.6  Platelets 150 - 400 K/uL 208 198 219   IMP: PPROM @ 34 week w/ rescue cerclage. BMZ complete. No evidence of chorio P) cont present mgmt. IOl sched 5/16

## 2014-09-20 NOTE — Progress Notes (Signed)
Patient ID: Brooke [name], female   DOB: 1986-02-03, 29 y.o.   MRN: [id number] HD #[id number] Cervical Insufficiency 34'1 with Rescue cerclage PPROM with stable AFI  S: No complaints. No bleeding or foul odor per vagina. Continues to have minimal fluid leakage.  Feels occ pressure. Good FM. No CP or SOB or HA  Mild right fundal tenderness now resolved, No epigastric pain Occ cramping, no contractions noted  O: Filed Vitals:   09/19/14 1604 09/19/14 1959 09/19/14 2208 09/20/14 0908  BP: 136/81 147/75 123/76 153/103  Pulse: 92 90 91 98  Temp: 98.2 F (36.8 C) 98.5 F (36.9 C) 98.5 F (36.9 C) 98.6 F (37 C)  TempSrc: Oral Oral Oral Oral  Resp: 18 18 18 18   Height:      Weight:      SpO2:         A&Ox3 WDWN. NAD Abd: Gravid, NT, Fundus appropriate with dates, soft, non-tender uterus,  No CVAT Pelvic: deferred Extr- neg c/c/e, 2+ DTR, no clonus Skin: no lesions Neuro : intact  CBC    Component Value Date/Time   WBC 9.3 09/18/2014 0805   RBC 4.50 09/18/2014 0805   HGB 14.2 09/18/2014 0805   HCT 40.0 09/18/2014 0805   PLT 208 09/18/2014 0805   MCV 88.9 09/18/2014 0805   MCH 31.6 09/18/2014 0805   MCHC 35.5 09/18/2014 0805   RDW 13.3 09/18/2014 0805   LYMPHSABS 2.8 08/28/2014 0805   MONOABS 0.7 08/28/2014 0805   EOSABS 0.2 08/28/2014 0805   BASOSABS 0.0 08/28/2014 0805   CMP     Component Value Date/Time   NA 137 09/18/2014 0805   K 3.7 09/18/2014 0805   CL 107 09/18/2014 0805   CO2 20* 09/18/2014 0805   GLUCOSE 91 09/18/2014 0805   BUN 7 09/18/2014 0805   CREATININE 0.54 09/18/2014 0805   CALCIUM 9.2 09/18/2014 0805   PROT 6.4* 09/18/2014 0805   ALBUMIN 3.3* 09/18/2014 0805   AST 18 09/18/2014 0805   ALT 12* 09/18/2014 0805   ALKPHOS 99 09/18/2014 0805   BILITOT 0.3 09/18/2014 0805   GFRNONAA >60 09/18/2014 0805   GFRAA >60 09/18/2014 0805        GBS neg  4/20 Sono 4/25 c/w AGA, vtx, nl AFI Nl AFI(10.7) 5/9, cephalic Normal 3gtt 3/31 BS on  CMP 178 BMZ 3/8, 3/9- booster dose 4/14 2pp < 120 ( one elevated), FBS 78  A: 34'1  IUP with AGA, low normal but stable AFI Presumed PPROM - no evidence of chorio, rpt  GBS neg, neg spec exam, pos amnisure Cervical Insufficiency s/p rescue cerclage Remote history of Epilepsy- no meds ? CHTN - elevating bp's, no s/s PEC, nl labs 5/12, cont to watch closely History of one abnl on 3gtt, Random BS stable.  P: Monitor s/s PEC- Latency abx completed No tocolysis if labors BMZ complete Weekly 17P recommended until 34 wks- pt declined  Bedrest with BRP, SCDs NICU consult done Continue NST qd Continue inpatient management IOL 5/16 am PP contraception d/w pt- encouraged IUD.   [name] A. 09/20/2014 12:50 PM

## 2014-09-21 LAB — COMPREHENSIVE METABOLIC PANEL
ALBUMIN: 2.9 g/dL — AB (ref 3.5–5.0)
ALT: 12 U/L — ABNORMAL LOW (ref 14–54)
ANION GAP: 8 (ref 5–15)
AST: 17 U/L (ref 15–41)
Alkaline Phosphatase: 94 U/L (ref 38–126)
BUN: 7 mg/dL (ref 6–20)
CHLORIDE: 105 mmol/L (ref 101–111)
CO2: 24 mmol/L (ref 22–32)
Calcium: 9 mg/dL (ref 8.9–10.3)
Creatinine, Ser: 0.54 mg/dL (ref 0.44–1.00)
GFR calc Af Amer: >60 mL/min (ref >60)
Glucose, Bld: 101 mg/dL — ABNORMAL HIGH (ref 65–99)
Potassium: 4 mmol/L (ref 3.5–5.1)
SODIUM: 137 mmol/L (ref 135–145)
TOTAL PROTEIN: 5.9 g/dL — AB (ref 6.5–8.1)
Total Bilirubin: 0.2 mg/dL — ABNORMAL LOW (ref 0.3–1.2)

## 2014-09-21 LAB — CBC
HCT: 36.6 % (ref 36.0–46.0)
Hemoglobin: 12.6 g/dL (ref 12.0–15.0)
MCH: 31 pg (ref 26.0–34.0)
MCHC: 34.4 g/dL (ref 30.0–36.0)
MCV: 90.1 fL (ref 78.0–100.0)
Platelets: 164 10*3/uL (ref 150–400)
RBC: 4.06 MIL/uL (ref 3.87–5.11)
RDW: 13.4 % (ref 11.5–15.5)
WBC: 7.3 10*3/uL (ref 4.0–10.5)

## 2014-09-21 LAB — TYPE AND SCREEN
ABO/RH(D): B POS
Antibody Screen: NEGATIVE

## 2014-09-21 MED ORDER — POLYETHYLENE GLYCOL 3350 17 G PO PACK: 17.0000 g | PACK | Freq: Every day | ORAL | Status: DC

## 2014-09-21 MED ORDER — ONDANSETRON HCL 4 MG PO TABS
4.0000 mg | ORAL_TABLET | Freq: Once | ORAL | Status: AC
  Administered 2014-09-21: 4 mg via ORAL
  Filled 2014-09-21: qty 1

## 2014-09-21 NOTE — Progress Notes (Signed)
Patient ID: Brooke [name], female   DOB: Apr 21, 1986, 29 y.o.   MRN: [id number] HD #[id number] Cervical Insufficiency 34'2 with Rescue cerclage PPROM with stable AFI  S: Pt with increased cramps and nausea this am, responded to oral Zofran and currently denies nausea, emesis, diarrhea. No HA. Pt notes poor sleep due to swelling of hands. No bleeding or foul odor per vagina. Continues to have fluid leakage, slightly heavier than prior. No CP or SOB or HA   O: Filed Vitals:   09/20/14 1603 09/20/14 2029 09/21/14 0806 09/21/14 1210  BP: 142/86 125/66 125/81 160/86  Pulse: 98 87 93 104  Temp: 98.4 F (36.9 C) 98.5 F (36.9 C) 98.6 F (37 C) 98.4 F (36.9 C)  TempSrc: Oral Oral Oral Oral  Resp: 18 18 18 18   Height:      Weight:      SpO2:         A&Ox3 WDWN. NAD Abd: Gravid, NT, soft, non-tender uterus, No RUQ pain Back: No CVAT Pelvic: deferred Extr- neg c/c/e, 2+ DTR, no clonus Skin: no lesions Neuro : intact Toco: none FH: 130's, + accels, no decels, 10 beat var  CBC    Component Value Date/Time   WBC 7.3 09/21/2014 1223   RBC 4.06 09/21/2014 1223   HGB 12.6 09/21/2014 1223   HCT 36.6 09/21/2014 1223   PLT 164 09/21/2014 1223   MCV 90.1 09/21/2014 1223   MCH 31.0 09/21/2014 1223   MCHC 34.4 09/21/2014 1223   RDW 13.4 09/21/2014 1223   LYMPHSABS 2.8 08/28/2014 0805   MONOABS 0.7 08/28/2014 0805   EOSABS 0.2 08/28/2014 0805   BASOSABS 0.0 08/28/2014 0805   CMP     Component Value Date/Time   NA 137 09/21/2014 1223   K 4.0 09/21/2014 1223   CL 105 09/21/2014 1223   CO2 24 09/21/2014 1223   GLUCOSE 101* 09/21/2014 1223   BUN 7 09/21/2014 1223   CREATININE 0.54 09/21/2014 1223   CALCIUM 9.0 09/21/2014 1223   PROT 5.9* 09/21/2014 1223   ALBUMIN 2.9* 09/21/2014 1223   AST 17 09/21/2014 1223   ALT 12* 09/21/2014 1223   ALKPHOS 94 09/21/2014 1223   BILITOT 0.2* 09/21/2014 1223   GFRNONAA >60 09/21/2014 1223   GFRAA >60 09/21/2014 1223        GBS neg   4/20 Sono 4/25 c/w AGA, vtx, nl AFI Nl AFI(10.7) 5/9, cephalic Normal 3gtt 3/31 BS on CMP 178 BMZ 3/8, 3/9- booster dose 4/14   A: 34'2  IUP with AGA, low normal but stable AFI Presumed PPROM - no evidence of chorio, rpt  GBS neg, neg spec exam, pos amnisure Cervical Insufficiency s/p rescue cerclage Remote history of Epilepsy- no meds ? CHTN - elevating bp's, no s/s PEC, nl labs today, cont to watch closely GI symptoms today- no elevated WBC,no evidence PEC, sx improved with Zofran  P: Monitor s/s PEC- Latency abx completed No tocolysis if labors BMZ complete Weekly 17P recommended until 34 wks- pt declined  Bedrest with BRP, SCDs NICU consult done Continue NST qd Continue inpatient management IOL 5/16 am PP contraception d/w pt- encouraged IUD.   Malley Hauter A. 09/21/2014 3:45 PM

## 2014-09-21 NOTE — Progress Notes (Signed)
Patient ID: Brooke [name], female   DOB: 11/20/1985, 29 y.o.   MRN: [id number] HD #[id number] Cervical Insufficiency 34wks 2d with Rescue cerclage PPROM with stable AFI  S: No complaints. No bleeding or foul odor per vagina. Continues to have minimal fluid leakage.  Feels occ pressure. Good FM. No CP or SOB or HA  Mild right fundal tenderness now resolved, No epigastric pain Occ cramping, no contractions noted  O: BP 125/81 mmHg  Pulse 93  Temp(Src) 98.6 F (37 C) (Oral)  Resp 18  Ht 5\' 5"  (1.651 m)  Wt 108.41 kg (239 lb)  BMI 39.77 kg/m2  SpO2 96%  LMP 12/12/2013 (LMP Unknown)  Tm 98.6  A&Ox3 WDWN. NAD Lungs: CTA , No WRR CV: RRR Abd: Gravid, NT, Fundus appropriate with dates, soft, non-tender uterus,  No CVAT Pelvic: deferred Extr- neg c/c/e, SCDs intact Skin: no lesions Neuro : intact  CBC    Component Value Date/Time   WBC 7.3 09/21/2014 1223   RBC 4.06 09/21/2014 1223   HGB 12.6 09/21/2014 1223   HCT 36.6 09/21/2014 1223   PLT 164 09/21/2014 1223   MCV 90.1 09/21/2014 1223   MCH 31.0 09/21/2014 1223   MCHC 34.4 09/21/2014 1223   RDW 13.4 09/21/2014 1223   LYMPHSABS 2.8 08/28/2014 0805   MONOABS 0.7 08/28/2014 0805   EOSABS 0.2 08/28/2014 0805   BASOSABS 0.0 08/28/2014 0805     CMP     Component Value Date/Time   NA 137 09/21/2014 1223   K 4.0 09/21/2014 1223   CL 105 09/21/2014 1223   CO2 24 09/21/2014 1223   GLUCOSE 101* 09/21/2014 1223   BUN 7 09/21/2014 1223   CREATININE 0.54 09/21/2014 1223   CALCIUM 9.0 09/21/2014 1223   PROT 5.9* 09/21/2014 1223   ALBUMIN 2.9* 09/21/2014 1223   AST 17 09/21/2014 1223   ALT 12* 09/21/2014 1223   ALKPHOS 94 09/21/2014 1223   BILITOT 0.2* 09/21/2014 1223   GFRNONAA >60 09/21/2014 1223   GFRAA >60 09/21/2014 1223         FHTs 130 Reactive / category I and rare contractions, no decels  GBS neg  4/20 Sono 4/25 c/w AGA, vtx, nl AFI Nl AFI(10.7) 5/9, cephalic Normal 3gtt 3/31 BS on CMP 178 BMZ  3/8, 3/9- booster dose 4/14 2pp < 120 ( one elevated), FBS 78  A: 34 wk 2d IUP with AGA, low normal but stable AFI Presumed PPROM - no evidence of chorio, rpt  GBS neg, neg spec exam, pos amnisure Cervical Insufficiency s/p rescue cerclage Remote history of Epilepsy- no meds ? CHTN - stable BP, no s/s PEC History of one abnl on 3gtt, Random BS stable.  P: Monitor s/s PEC- rpt labs tomorrow Latency abx completed No tocolysis if labors BMZ complete Bedrest with BRP, SCDs NICU consult done Continue NST qd Continue inpatient management IOL 5/16 am with cerclage removal

## 2014-09-22 ENCOUNTER — Encounter (HOSPITAL_COMMUNITY): Payer: Self-pay | Admitting: *Deleted

## 2014-09-22 ENCOUNTER — Inpatient Hospital Stay (HOSPITAL_COMMUNITY)
Admit: 2014-09-22 | Discharge: 2014-09-22 | Disposition: A | Discharge status: Home / Self Care | Payer: 59 | Attending: Obstetrics and Gynecology | Admitting: Obstetrics and Gynecology

## 2014-09-22 ENCOUNTER — Inpatient Hospital Stay (HOSPITAL_COMMUNITY): Payer: 59 | Admitting: Anesthesiology

## 2014-09-22 LAB — CBC
HCT: 39.6 % (ref 36.0–46.0)
Hemoglobin: 13.8 g/dL (ref 12.0–15.0)
MCH: 31.2 pg (ref 26.0–34.0)
MCHC: 34.8 g/dL (ref 30.0–36.0)
MCV: 89.6 fL (ref 78.0–100.0)
PLATELETS: 194 10*3/uL (ref 150–400)
RBC: 4.42 MIL/uL (ref 3.87–5.11)
RDW: 13.4 % (ref 11.5–15.5)
WBC: 8.6 10*3/uL (ref 4.0–10.5)

## 2014-09-22 LAB — COMPREHENSIVE METABOLIC PANEL
ALK PHOS: 104 U/L (ref 38–126)
ALT: 13 U/L — AB (ref 14–54)
AST: 19 U/L (ref 15–41)
Albumin: 3.4 g/dL — ABNORMAL LOW (ref 3.5–5.0)
Anion gap: 12 (ref 5–15)
BUN: 7 mg/dL (ref 6–20)
CALCIUM: 9.3 mg/dL (ref 8.9–10.3)
CHLORIDE: 104 mmol/L (ref 101–111)
CO2: 21 mmol/L — AB (ref 22–32)
Creatinine, Ser: 0.56 mg/dL (ref 0.44–1.00)
GFR calc Af Amer: >60 mL/min (ref >60)
GLUCOSE: 91 mg/dL (ref 65–99)
POTASSIUM: 4 mmol/L (ref 3.5–5.1)
Sodium: 137 mmol/L (ref 135–145)
Total Bilirubin: 0.2 mg/dL — ABNORMAL LOW (ref 0.3–1.2)
Total Protein: 6.4 g/dL — ABNORMAL LOW (ref 6.5–8.1)

## 2014-09-22 MED ORDER — LACTATED RINGERS IV SOLN
INTRAVENOUS | Status: DC
  Administered 2014-09-22 (×2): via INTRAVENOUS

## 2014-09-22 MED ORDER — BUTALBITAL-APAP-CAFFEINE 50-325-40 MG PO TABS
2.0000 | ORAL_TABLET | Freq: Once | ORAL | Status: AC
  Administered 2014-09-22: 2 via ORAL
  Filled 2014-09-22: qty 2

## 2014-09-22 MED ORDER — ZOLPIDEM TARTRATE 5 MG PO TABS: 5.0000 mg | ORAL_TABLET | Freq: Every evening | ORAL | Status: DC | PRN

## 2014-09-22 MED ORDER — LACTATED RINGERS IV SOLN: 500.0000 mL | INTRAVENOUS | Status: DC | PRN

## 2014-09-22 MED ORDER — CITRIC ACID-SODIUM CITRATE 334-500 MG/5ML PO SOLN: 30.0000 mL | ORAL | Status: DC | PRN

## 2014-09-22 MED ORDER — OXYTOCIN 40 UNITS IN LACTATED RINGERS INFUSION - SIMPLE MED: 62.5000 mL/h | INTRAVENOUS | Status: DC

## 2014-09-22 MED ORDER — DIPHENHYDRAMINE HCL 50 MG/ML IJ SOLN: 12.5000 mg | INTRAMUSCULAR | Status: DC | PRN

## 2014-09-22 MED ORDER — ACETAMINOPHEN 325 MG PO TABS: 650.0000 mg | ORAL_TABLET | ORAL | Status: DC | PRN

## 2014-09-22 MED ORDER — FLEET ENEMA 7-19 GM/118ML RE ENEM: 1.0000 | ENEMA | RECTAL | Status: DC | PRN

## 2014-09-22 MED ORDER — EPHEDRINE 5 MG/ML INJ
10.0000 mg | INTRAVENOUS | Status: DC | PRN
  Filled 2014-09-22: qty 2

## 2014-09-22 MED ORDER — LIDOCAINE HCL (PF) 1 % IJ SOLN
INTRAMUSCULAR | Status: AC
  Filled 2014-09-22: qty 30

## 2014-09-22 MED ORDER — OXYTOCIN 40 UNITS IN LACTATED RINGERS INFUSION - SIMPLE MED
1.0000 m[IU]/min | INTRAVENOUS | Status: DC
  Administered 2014-09-22: 2 m[IU]/min via INTRAVENOUS
  Filled 2014-09-22: qty 1000

## 2014-09-22 MED ORDER — BUTORPHANOL TARTRATE 1 MG/ML IJ SOLN
1.0000 mg | Freq: Once | INTRAMUSCULAR | Status: AC
  Administered 2014-09-22: 1 mg via INTRAVENOUS
  Filled 2014-09-22: qty 1

## 2014-09-22 MED ORDER — ONDANSETRON HCL 4 MG/2ML IJ SOLN: 4.0000 mg | Freq: Four times a day (QID) | INTRAMUSCULAR | Status: DC | PRN

## 2014-09-22 MED ORDER — FENTANYL 2.5 MCG/ML BUPIVACAINE 1/10 % EPIDURAL INFUSION (WH - ANES): 14.0000 mL/h | INTRAMUSCULAR | Status: DC | PRN

## 2014-09-22 MED ORDER — OXYCODONE-ACETAMINOPHEN 5-325 MG PO TABS: 1.0000 | ORAL_TABLET | ORAL | Status: DC | PRN

## 2014-09-22 MED ORDER — OXYTOCIN BOLUS FROM INFUSION: 500.0000 mL | INTRAVENOUS | Status: DC

## 2014-09-22 MED ORDER — LIDOCAINE HCL (PF) 1 % IJ SOLN
30.0000 mL | INTRAMUSCULAR | Status: AC | PRN
  Administered 2014-09-22: 6 mL via SUBCUTANEOUS

## 2014-09-22 MED ORDER — FENTANYL 2.5 MCG/ML BUPIVACAINE 1/10 % EPIDURAL INFUSION (WH - ANES)
14.0000 mL/h | INTRAMUSCULAR | Status: DC | PRN
  Administered 2014-09-22 (×3): 14 mL/h via EPIDURAL
  Filled 2014-09-22 (×2): qty 125

## 2014-09-22 MED ORDER — PHENYLEPHRINE 40 MCG/ML (10ML) SYRINGE FOR IV PUSH (FOR BLOOD PRESSURE SUPPORT)
80.0000 ug | PREFILLED_SYRINGE | INTRAVENOUS | Status: DC | PRN
  Filled 2014-09-22: qty 2
  Filled 2014-09-22: qty 20

## 2014-09-22 MED ORDER — SODIUM BICARBONATE 8.4 % IV SOLN
INTRAVENOUS | Status: DC | PRN
  Administered 2014-09-22: 4 mL via EPIDURAL

## 2014-09-22 MED ORDER — TERBUTALINE SULFATE 1 MG/ML IJ SOLN: 0.2500 mg | Freq: Once | INTRAMUSCULAR | Status: AC | PRN

## 2014-09-22 MED ORDER — OXYCODONE-ACETAMINOPHEN 5-325 MG PO TABS: 2.0000 | ORAL_TABLET | ORAL | Status: DC | PRN

## 2014-09-22 NOTE — Progress Notes (Signed)
RN notified MD of pt's elevated BPs - pt very anxious. Per MD, RN to administer 1 mg stadol to help w/anxiety. RN to update MD after stadol administration.

## 2014-09-22 NOTE — Progress Notes (Signed)
Patient ID: Brooke [name], female   DOB: [birth year], 29 y.o.   MRN: [id number]  Procedure note dictated.

## 2014-09-22 NOTE — Progress Notes (Signed)
MD at bedside -pt's cerclage clipped.

## 2014-09-22 NOTE — Progress Notes (Signed)
Brooke [name] is a 29 y.o. G2P0 at 6376w3d by LMP admitted for induction of labor due to PPROM.  Subjective: Comfortable  Objective: BP 149/92 mmHg  Pulse 88  Temp(Src) 99.2 F (37.3 C) (Oral)  Resp 18  Ht 5\' 5"  (1.651 m)  Wt 108.41 kg (239 lb)  BMI 39.77 kg/m2  SpO2 100%  LMP 12/12/2013 (LMP Unknown)      FHT:  FHR: 145 bpm, variability: moderate,  accelerations:  Present,  decelerations:  Absent UC:   irregular, every 2-5 minutes SVE:   Dilation: 3.5 Effacement (%): 80 Station: 0 Exam by:: Dr. Billy Coastaavon AROM- clear  Labs: Lab Results  Component Value Date   WBC 8.6 09/22/2014   HGB 13.8 09/22/2014   HCT 39.6 09/22/2014   MCV 89.6 09/22/2014   PLT 194 09/22/2014    Assessment / Plan: Induction of labor due to PROM,  progressing well on pitocin (PPROM) HTN- stable. No s/s PEC  Labor: Progressing normally Preeclampsia:  no signs or symptoms of toxicity Fetal Wellbeing:  Category I Pain Control:  Epidural I/D:  n/a Anticipated MOD:  NSVD  Ikram Riebe J 09/22/2014, 1:50 PM

## 2014-09-22 NOTE — Progress Notes (Signed)
RN notified MD of pt's BP after administration of stadol. BP now 149/92.

## 2014-09-22 NOTE — Progress Notes (Signed)
Brooke [name] is a 29 y.o. G2P0 at 5580w3d by LMP admitted for induction of labor due to PPROM.  Subjective: Feels pressure  Objective: BP 148/99 mmHg  Pulse 98  Temp(Src) 99.3 F (37.4 C) (Oral)  Resp 20  Ht 5\' 5"  (1.651 m)  Wt 108.41 kg (239 lb)  BMI 39.77 kg/m2  SpO2 95%  LMP 12/12/2013 (LMP Unknown)      FHT:  FHR: 145 bpm, variability: moderate,  accelerations:  Present,  decelerations:  Absent UC:   regular, every 2-3 minutes SVE:   Dilation: 5 Effacement (%): 90 Station: 0 Exam by:: Dr. Billy Coastaavon  Labs: Lab Results  Component Value Date   WBC 8.6 09/22/2014   HGB 13.8 09/22/2014   HCT 39.6 09/22/2014   MCV 89.6 09/22/2014   PLT 194 09/22/2014    Assessment / Plan: Induction of labor due to PROM,  progressing well on pitocin(PPROM) HTN stable  Labor: Progressing normally Preeclampsia:  no signs or symptoms of toxicity Fetal Wellbeing:  Category I Pain Control:  Epidural I/D:  n/a Anticipated MOD:  NSVD  Daphne Karrer J 09/22/2014, 4:03 PM

## 2014-09-22 NOTE — Anesthesia Preprocedure Evaluation (Signed)
Anesthesia Evaluation  Patient identified by MRN, date of birth, ID band Patient awake    Reviewed: Allergy & Precautions, H&P , Patient's Chart, lab work & pertinent test results  Airway Mallampati: II  TM Distance: >3 FB Neck ROM: full    Dental  (+) Teeth Intact   Pulmonary  breath sounds clear to auscultation        Cardiovascular hypertension, Rhythm:regular Rate:Normal     Neuro/Psych Seizures - (Not generalized. Myoclonic only;  No meds),     GI/Hepatic   Endo/Other  Morbid obesity  Renal/GU      Musculoskeletal   Abdominal   Peds  Hematology   Anesthesia Other Findings       Reproductive/Obstetrics (+) Pregnancy                             Anesthesia Physical Anesthesia Plan  ASA: III  Anesthesia Plan: Epidural   Post-op Pain Management:    Induction:   Airway Management Planned:   Additional Equipment:   Intra-op Plan:   Post-operative Plan:   Informed Consent: I have reviewed the patients History and Physical, chart, labs and discussed the procedure including the risks, benefits and alternatives for the proposed anesthesia with the patient or authorized representative who has indicated his/her understanding and acceptance.   Dental Advisory Given  Plan Discussed with:   Anesthesia Plan Comments: (Labs checked- platelets confirmed with RN in room. Fetal heart tracing, per RN, reported to be stable enough for sitting procedure. Discussed epidural, and patient consents to the procedure:  included risk of possible headache,backache, failed block, allergic reaction, and nerve injury. This patient was asked if she had any questions or concerns before the procedure started.)        Anesthesia Quick Evaluation

## 2014-09-22 NOTE — Progress Notes (Signed)
Ur chart review completed.  

## 2014-09-22 NOTE — Progress Notes (Signed)
Brooke [name] is a 29 y.o. G2P0 at 1961w3d by LMP admitted for PPROM  Subjective: Occ pressure  Objective: BP 145/78 mmHg  Pulse 92  Temp(Src) 98.9 F (37.2 C) (Oral)  Resp 18  Ht 5\' 5"  (1.651 m)  Wt 108.41 kg (239 lb)  BMI 39.77 kg/m2  SpO2 95%  LMP 12/12/2013 (LMP Unknown)      FHT:  FHR: 145 bpm, variability: moderate,  accelerations:  Present,  decelerations:  Absent UC:   irregular, every 1-3 minutes SVE:   7+/90/0 IUPC placed  Labs: Lab Results  Component Value Date   WBC 8.6 09/22/2014   HGB 13.8 09/22/2014   HCT 39.6 09/22/2014   MCV 89.6 09/22/2014   PLT 194 09/22/2014    Assessment / Plan: Induction of labor due to PPROM,  progressing well on pitocin  Gestational HTN- stable  Labor: Progressing normally Preeclampsia:  no signs or symptoms of toxicity, intake and ouput balanced and labs stable Fetal Wellbeing:  Category I Pain Control:  Epidural I/D:  n/a Anticipated MOD:  NSVD  Nicholus Chandran J 09/22/2014, 7:09 PM

## 2014-09-22 NOTE — Anesthesia Procedure Notes (Addendum)
Epidural Patient location during procedure: OB  Preanesthetic Checklist Completed: patient identified, site marked, surgical consent, pre-op evaluation, timeout performed, IV checked, risks and benefits discussed and monitors and equipment checked  Epidural Patient position: sitting Prep: site prepped and draped and DuraPrep Patient monitoring: continuous pulse ox and blood pressure Approach: midline Location: L3-L4 Injection technique: LOR air  Needle:  Needle type: Tuohy  Needle gauge: 17 G Needle length: 9 cm and 9 Needle insertion depth: 7 cm Catheter type: closed end flexible Catheter size: 19 Gauge Catheter at skin depth: 15 cm Test dose: negative  Assessment Events: blood not aspirated, injection not painful, no injection resistance, negative IV test and no paresthesia  Additional Notes Dosing of Epidural:  1st dose, through catheter .............................................  Xylocaine 40 mg  2nd dose, through catheter, after waiting 3 minutes.........Xylocaine 60 mg    ( 1% Xylo charted as a single dose in Epic Meds for ease of charting; actual dosing was fractionated as above, for saftey's sake)  As each dose occurred, patient was free of IV sx; and patient exhibited no evidence of SA injection.  Patient is more comfortable after epidural dosed. Please see RN's note for documentation of vital signs,and FHR which are stable.  Patient reminded not to try to ambulate with numb legs, and that an RN must be present when she attempts to get up.       

## 2014-09-23 ENCOUNTER — Encounter (HOSPITAL_COMMUNITY): Payer: Self-pay

## 2014-09-23 ENCOUNTER — Encounter (HOSPITAL_COMMUNITY): Payer: Self-pay | Admitting: Anesthesiology

## 2014-09-23 LAB — CBC
HEMATOCRIT: 36.7 % (ref 36.0–46.0)
Hemoglobin: 12.5 g/dL (ref 12.0–15.0)
MCH: 30.6 pg (ref 26.0–34.0)
MCHC: 34.1 g/dL (ref 30.0–36.0)
MCV: 89.7 fL (ref 78.0–100.0)
PLATELETS: 169 10*3/uL (ref 150–400)
RBC: 4.09 MIL/uL (ref 3.87–5.11)
RDW: 13.6 % (ref 11.5–15.5)
WBC: 12.5 10*3/uL — ABNORMAL HIGH (ref 4.0–10.5)

## 2014-09-23 LAB — RPR
RPR Ser Ql: NONREACTIVE
RPR: NONREACTIVE

## 2014-09-23 MED ORDER — OXYCODONE-ACETAMINOPHEN 5-325 MG PO TABS
2.0000 | ORAL_TABLET | ORAL | Status: DC | PRN
  Filled 2014-09-23: qty 2

## 2014-09-23 MED ORDER — ONDANSETRON HCL 4 MG/2ML IJ SOLN: 4.0000 mg | INTRAMUSCULAR | Status: DC | PRN

## 2014-09-23 MED ORDER — IBUPROFEN 600 MG PO TABS
600.0000 mg | ORAL_TABLET | Freq: Four times a day (QID) | ORAL | Status: DC
  Administered 2014-09-23 – 2014-09-24 (×5): 600 mg via ORAL
  Filled 2014-09-23 (×6): qty 1

## 2014-09-23 MED ORDER — ZOLPIDEM TARTRATE 5 MG PO TABS: 5.0000 mg | ORAL_TABLET | Freq: Every evening | ORAL | Status: DC | PRN

## 2014-09-23 MED ORDER — METHYLERGONOVINE MALEATE 0.2 MG/ML IJ SOLN: 0.2000 mg | INTRAMUSCULAR | Status: DC | PRN

## 2014-09-23 MED ORDER — LANOLIN HYDROUS EX OINT: TOPICAL_OINTMENT | CUTANEOUS | Status: DC | PRN

## 2014-09-23 MED ORDER — PRENATAL MULTIVITAMIN CH
1.0000 | ORAL_TABLET | Freq: Every day | ORAL | Status: DC
  Administered 2014-09-23 – 2014-09-24 (×2): 1 via ORAL
  Filled 2014-09-23 (×2): qty 1

## 2014-09-23 MED ORDER — WITCH HAZEL-GLYCERIN EX PADS
1.0000 "application " | MEDICATED_PAD | CUTANEOUS | Status: DC | PRN
  Administered 2014-09-23: 1 via TOPICAL

## 2014-09-23 MED ORDER — ONDANSETRON HCL 4 MG PO TABS
4.0000 mg | ORAL_TABLET | ORAL | Status: DC | PRN
Start: 2014-09-23 — End: 2014-09-24

## 2014-09-23 MED ORDER — METHYLERGONOVINE MALEATE 0.2 MG PO TABS: 0.2000 mg | ORAL_TABLET | ORAL | Status: DC | PRN

## 2014-09-23 MED ORDER — ACETAMINOPHEN 325 MG PO TABS: 650.0000 mg | ORAL_TABLET | ORAL | Status: DC | PRN

## 2014-09-23 MED ORDER — TETANUS-DIPHTH-ACELL PERTUSSIS 5-2.5-18.5 LF-MCG/0.5 IM SUSP
0.5000 mL | Freq: Once | INTRAMUSCULAR | Status: DC
  Filled 2014-09-23: qty 0.5

## 2014-09-23 MED ORDER — BENZOCAINE-MENTHOL 20-0.5 % EX AERO
1.0000 "application " | INHALATION_SPRAY | CUTANEOUS | Status: DC | PRN
  Administered 2014-09-23: 1 via TOPICAL
  Filled 2014-09-23: qty 56

## 2014-09-23 MED ORDER — DIPHENHYDRAMINE HCL 25 MG PO CAPS: 25.0000 mg | ORAL_CAPSULE | Freq: Four times a day (QID) | ORAL | Status: DC | PRN

## 2014-09-23 MED ORDER — OXYCODONE-ACETAMINOPHEN 5-325 MG PO TABS
1.0000 | ORAL_TABLET | ORAL | Status: DC | PRN
  Administered 2014-09-23 (×2): 1 via ORAL
  Filled 2014-09-23: qty 1

## 2014-09-23 MED ORDER — SENNOSIDES-DOCUSATE SODIUM 8.6-50 MG PO TABS
2.0000 | ORAL_TABLET | ORAL | Status: DC
  Administered 2014-09-23 (×2): 2 via ORAL
  Filled 2014-09-23 (×2): qty 2

## 2014-09-23 MED ORDER — SIMETHICONE 80 MG PO CHEW: 80.0000 mg | CHEWABLE_TABLET | ORAL | Status: DC | PRN

## 2014-09-23 MED ORDER — DIBUCAINE 1 % RE OINT
1.0000 "application " | TOPICAL_OINTMENT | RECTAL | Status: DC | PRN
  Filled 2014-09-23: qty 28

## 2014-09-23 NOTE — Anesthesia Postprocedure Evaluation (Signed)
  Anesthesia Post-op Note  Patient: Brooke [name]  Procedure(s) Performed: * No procedures listed *  Patient Location: [facility]  Anesthesia Type:Epidural  Level of Consciousness: awake, alert , oriented and patient cooperative  Airway and Oxygen Therapy: Patient Spontanous Breathing  Post-op Pain: mild  Post-op Assessment: Patient's Cardiovascular Status Stable, Respiratory Function Stable, No headache, No backache, No residual numbness and No residual motor weakness  Post-op Vital Signs: stable  Last Vitals:  Filed Vitals:   09/23/14 0428  BP: 155/76  Pulse: 82  Temp: 36.9 C  Resp: 18    Complications: No apparent anesthesia complications

## 2014-09-23 NOTE — Op Note (Signed)
Brooke Masson:  [name], Brooke       ACCOUNT NO.:  [id number]  MEDICAL RECORD NO.:  [id number]  LOCATION:  9310                          FACILITY:  [facility]  PHYSICIAN:  Lenoard Adenichard J. Minh Roanhorse, M.D.DATE OF BIRTH:  February 20, 1986  DATE OF PROCEDURE: DATE OF DISCHARGE:                              OPERATIVE REPORT   PREOPERATIVE DIAGNOSIS:  Preterm premature rupture of membranes at 34 weeks with McDonald cerclage.  POSTOPERATIVE DIAGNOSIS:  Preterm premature rupture of membranes at 34 weeks with McDonald cerclage.  PROCEDURE:  McDonald cerclage removal.  SURGEON:  Lenoard Adenichard J. Khilee Hendricksen, M.D.  ASSISTANT:  None.  ANESTHESIA:  Epidural.  ESTIMATED BLOOD LOSS:  Minimal.  COMPLICATIONS:  None.  DRAINS:  None.  DISPOSITION:  The patient was recovering in good condition.  DESCRIPTION OF PROCEDURE:  After being apprised of risks and benefits of surgery to include injury to surrounding organs with possible need for repair, bleeding and infection, bivalve speculum was placed.  Cervical cerclage was identified at 1 o'clock.  The Prolene tag was pulled cephalad using tension and a ring forceps.  The knot of the Ethibond suture was identified and cut.  The suture was removed otherwise intact, good hemostasis was noted.  Cervical dilatation to 4 cm was noted.  The patient tolerated the procedure well and was recovering in good condition.     Lenoard Adenichard J. Alen Matheson, M.D.     RJT/MEDQ  D:  09/22/2014  T:  09/23/2014  Job:  [id number]

## 2014-09-23 NOTE — Lactation Note (Signed)
This note was copied from the chart of Brooke Nico Riggins-Bogue. Lactation Consultation Note  Patient Name: Brooke Chyrel MassonCharity Riggins-Bogue ZOXWR'UToday's Date: 09/23/2014 Reason for consult: Initial assessment;NICU baby NICU baby 17 hours of life. Mom just finishing pumping with DEBP when LC entered room. Mom states that this is the second time she has used DEBP. Mom has been at baby's bedside in NICU for a big part of the day. Mom states that she has PCOS, and her breasts are v-shaped and widely spaced apart. Mom reports that she leaked colostrum during pregnancy, and has been able to hand express and see a little colostrum on nipple, but not enough to collect. Mom return-demonstrated hand expression without colostrum present. Discussed with mom that colostrum can appear, then ebb, then flow again. Enc mom to massage and hand express prior to and after use of DEBP. Enc mom to use DEBP every 3 hours for 15 minutes. Mom given NICU booklet with review and small bottles with stickers and instructions for labeling. Mom also given handouts for Fenugreek, Moringa, and lactation cookies. Mom aware of OP/BFSG and LC phone line assistance after D/C. Enc mom to call for assistance as needed. Enc mom to visit baby often and provide STS/Kangaroo care as able.  Maternal Data Has patient been taught Hand Expression?: Yes Does the patient have breastfeeding experience prior to this delivery?: No  Feeding    LATCH Score/Interventions                      Lactation Tools Discussed/Used Pump Review: Setup, frequency, and cleaning;Milk Storage Initiated by:: Bedside nurse. Date initiated:: 09/23/14   Consult Status Consult Status: Follow-up Date: 09/24/14 Follow-up type: In-patient    Geralynn OchsWILLIARD, Merlon Alcorta 09/23/2014, 4:37 PM

## 2014-09-24 LAB — RUBELLA SCREEN: Rubella: 2.16 index (ref >0.99)

## 2014-09-24 LAB — GLUCOSE, CAPILLARY: GLUCOSE-CAPILLARY: 94 mg/dL (ref 65–99)

## 2014-09-24 MED ORDER — PANTOPRAZOLE SODIUM 20 MG PO TBEC
20.0000 mg | DELAYED_RELEASE_TABLET | Freq: Every day | ORAL | Status: DC
  Administered 2014-09-24: 20 mg via ORAL
  Filled 2014-09-24 (×2): qty 1

## 2014-09-24 MED ORDER — PANTOPRAZOLE SODIUM 20 MG PO TBEC: 20.0000 mg | DELAYED_RELEASE_TABLET | Freq: Every day | ORAL | Status: DC

## 2014-09-24 MED ORDER — IBUPROFEN 600 MG PO TABS: 600.0000 mg | ORAL_TABLET | Freq: Four times a day (QID) | ORAL | Status: DC

## 2014-09-24 MED ORDER — METFORMIN HCL ER 750 MG PO TB24
750.0000 mg | ORAL_TABLET | Freq: Every day | ORAL | Status: DC
Start: 2014-09-24 — End: 2015-07-15

## 2014-09-24 NOTE — Progress Notes (Signed)
Patient ID: Brooke [name], female   DOB: [birth year], 29 y.o.   MRN: [id number] PPD # 2 SVD / PPROM / Cerclage / Cervical Incompetence  S:  Reports feeling well             Tolerating po/ No nausea or vomiting             Bleeding is light             Pain controlled with ibuprofen (OTC)             Up ad lib / ambulatory / voiding without difficulties    Newborn  Information for the patient's newborn:  Donnal DebarRiggins-Bogue, Boy Dakayla [[id number]][[id number]]  female  tube and bottle feeding  / Circumcision infant in NICU   O:  A & O x 3, in no apparent distress              VS:  Filed Vitals:   09/23/14 1741 09/23/14 2128 09/24/14 0120 09/24/14 0646  BP: 152/72 133/79 118/60 134/67  Pulse: 91 92 98 80  Temp: 98.4 F (36.9 C) 99 F (37.2 C) 98 F (36.7 C) 98.5 F (36.9 C)  TempSrc: Oral Oral Oral Oral  Resp: 18 16 16 16   Height:      Weight:      SpO2: 99% 97% 100% 100%    LABS:  Recent Labs  09/22/14 0821 09/23/14 0745  WBC 8.6 12.5*  HGB 13.8 12.5  HCT 39.6 36.7  PLT 194 169    Blood type: B POS (05/15 0800)  Rubella: 2.16 (05/17 0745)     Lungs: Clear and unlabored  Heart: regular rate and rhythm / no murmurs  Abdomen: soft, non-tender, non-distended              Fundus: firm, non-tender, U-3  Perineum: 2nd degree repair healing well, no edema  Lochia: minimal  Extremities: no edema, no calf pain or tenderness, no Homans    A/P: PPD # 2  28 y.o., [id number]   Principal Problem:    Postpartum care following vaginal delivery (5/16) with 2nd degree laceration  Active Problems:    Cervical incompetence during pregnancy in third trimester    Amniotic fluid leaking    Elevated blood pressure - stable    Doing well - stable status  Routine post partum orders  Discharge home today  Blood pressure check at [facility] in 1 week   Raelyn MoraAWSON, Latria Mccarron, M, MSN, CNM 09/24/2014, 11:11 AM

## 2014-09-24 NOTE — Discharge Instructions (Signed)
Breast Pumping Tips °If you are breastfeeding, there may be times when you cannot feed your baby directly. Returning to work or going on a trip are examples. Pumping allows you to store breast milk and feed it to your baby later.  °You may not get much milk when you first start to pump. Your breasts should start to make more after a few days. If you pump at the times you usually feed your baby, you may be able to keep making enough milk to feed your baby without also using formula. The more often you pump, the more milk your body will make. °WHEN SHOULD I PUMP?  °· You can start to pump soon after you have your baby. Ask your doctor what is right for you and your baby. °· If you are going back to work, start pumping a few weeks before. This gives you time to learn how to pump and to store a supply of milk. °· When you are with your baby, feed your baby when he or she is hungry. Pump after each feeding. °· When you are away from your baby for many hours, pump for about 15 minutes every 2-3 hours. Pump both breasts at the same time if you can. °· If your baby has a formula feeding, make sure to pump close to the same time. °· If you drink any alcohol, wait 2 hours before pumping. °HOW DO I GET READY TO PUMP? °Your let-down reflex is your body's natural reaction that makes your breast milk flow. It is easier to make your breast milk flow when you are relaxed. Try these things to help you relax: °· Smell one of your baby's blankets or an item of clothing. °· Look at a picture or video of your baby. °· Sit in a quiet, private space. °· Massage the breast you plan to pump. °· Place soothing warmth on the breast. °· Play relaxing music. °WHAT ARE SOME BREAST PUMPING TIPS? °· Wash your hands before you pump. You do not need to wash your nipples or breasts. °· There are three ways to pump. You can: °¨ Use your hand to massage and squeeze your breast. °¨ Use a handheld manual pump. °¨ Use an electric pump. °· Make sure the  suction cup on the breast pump is the right size. Place the suction cup directly over the nipple. It can be painful or hurt your nipple if it is the wrong size or placed wrong. °· Put a small amount of purified or modified lanolin on your nipple and areola if you are sore. °· If you are using an electric pump, change the speed and suction power to be more comfortable. °· You may need a different type of pump if pumping hurts or you do not get a lot of milk. Your doctor can help you pick what type of pump to use. °· Keep a full water bottle near you always. Drinking lots of fluid helps you make more milk. °· You can store your milk to use later. Pumped breast milk can be stored in a sealable, sterile container or plastic bag. Always put the date you pumped it on the container. °¨ Milk can stay out at room temperature for up to 8 hours. °¨ You can store your milk in the refrigerator for up to 8 days. °¨ You can store your milk in the freezer for 3 months. Thaw frozen milk using warm water. Do not put it in the microwave. °· Do not smoke.   Ask your doctor for help. WHEN SHOULD I CALL MY DOCTOR?  You have a hard time pumping.  You are worried you do not make enough milk.  You have nipple pain, soreness, or redness.  You want to take birth control pills. Document Released: 10/12/2007 Document Revised: 04/30/2013 Document Reviewed: 02/15/2013 [facility] Patient Information 2015 [place], [place]. This information is not intended to replace advice given to you by your health care provider. Make sure you discuss any questions you have with your health care provider. Nutrition for the New Mother  A new mother needs good health and nutrition so she can have energy to take care of a new baby. Whether a mother breastfeeds or formula feeds the baby, it is important to have a well-balanced diet. Foods from all the food groups should be chosen to meet the new mother's energy needs and to give her the nutrients needed for  repair and healing.  A HEALTHY EATING PLAN The My Pyramid plan for Moms outlines what you should eat to help you and your baby stay healthy. The energy and amount of food you need depends on whether or not you are breastfeeding. If you are breastfeeding you will need more nutrients. If you choose not to breastfeed, your nutrition goal should be to return to a healthy weight. Limiting calories may be needed if you are not breastfeeding.  HOME CARE INSTRUCTIONS   For a personal plan based on your unique needs, see your Registered Dietitian or visit [email or web address].  Eat a variety of foods. The plan below will help guide you. The following chart has a suggested daily meal plan from the My Pyramid for Moms.  Eat a variety of fruits and vegetables.  Eat more dark green and orange vegetables and cooked dried beans.  Make half your grains whole grains. Choose whole instead of refined grains.  Choose low-fat or lean meats and poultry.  Choose low-fat or fat-free dairy products like milk, cheese, or yogurt. Fruits  Breastfeeding: 2 cups  Non-Breastfeeding: 2 cups  What Counts as a serving?  1 cup of fruit or juice.   cup dried fruit. Vegetables  Breastfeeding: 3 cups  Non-Breastfeeding: 2  cups  What Counts as a serving?  1 cup raw or cooked vegetables.  Juice or 2 cups raw leafy vegetables. Grains  Breastfeeding: 8 oz  Non-Breastfeeding: 6 oz  What Counts as a serving?  1 slice bread.  1 oz ready-to-eat cereal.   cup cooked pasta, rice, or cereal. Meat and Beans  Breastfeeding: 6  oz  Non-Breastfeeding: 5  oz  What Counts as a serving?  1 oz lean meat, poultry, or fish   cup cooked dry beans   oz nuts or 1 egg  1 tbs peanut butter Milk  Breastfeeding: 3 cups  Non-Breastfeeding: 3 cups  What Counts as a serving?  1 cup milk.  8 oz yogurt.  1  oz cheese.  2 oz processed cheese. TIPS FOR THE BREASTFEEDING MOM  Rapid weight loss is  not suggested when you are breastfeeding. By simply breastfeeding, you will be able to lose the weight gained during your pregnancy. Your caregiver can keep track of your weight and tell you if your weight loss is appropriate.  Be sure to drink fluids. You may notice that you are thirstier than usual. A suggestion is to drink a glass of water or other beverage whenever you breastfeed.  Avoid alcohol as it can be passed into your breast milk.  Limit caffeine  drinks to no more than 2 to 3 cups per day.  You may need to keep taking your prenatal vitamin while you are breastfeeding. Talk with your caregiver about taking a vitamin or supplement. RETURING TO A HEALTHY WEIGHT  The My Pyramid Plan for Moms will help you return to a healthy weight. It will also provide the nutrients you need.  You may need to limit "empty" calories. These include:  High fat foods like fried foods, fatty meats, fast food, butter, and mayonnaise.  High sugar foods like sodas, jelly, candy, and sweets.  Be physically active. Include 30 minutes of exercise or more each day. Choose an activity you like such as walking, swimming, biking, or aerobics. Check with your caregiver before you start to exercise. Document Released: 08/02/2007 Document Revised: 07/18/2011 Document Reviewed: 08/02/2007 [facility] Patient Information 2015 [place]. This information is not intended to replace advice given to you by your health care provider. Make sure you discuss any questions you have with your health care provider. Postpartum Depression and Baby Blues The postpartum period begins right after the birth of a baby. During this time, there is often a great amount of joy and excitement. It is also a time of many changes in the life of the parents. Regardless of how many times a mother gives birth, each child brings new challenges and dynamics to the family. It is not unusual to have feelings of excitement along with confusing shifts  in moods, emotions, and thoughts. All mothers are at risk of developing postpartum depression or the "baby blues." These mood changes can occur right after giving birth, or they may occur many months after giving birth. The baby blues or postpartum depression can be mild or severe. Additionally, postpartum depression can go away rather quickly, or it can be a long-term condition.  CAUSES Raised hormone levels and the rapid drop in those levels are thought to be a main cause of postpartum depression and the baby blues. A number of hormones change during and after pregnancy. Estrogen and progesterone usually decrease right after the delivery of your baby. The levels of thyroid hormone and various cortisol steroids also rapidly drop. Other factors that play a role in these mood changes include major life events and genetics.  RISK FACTORS If you have any of the following risks for the baby blues or postpartum depression, know what symptoms to watch out for during the postpartum period. Risk factors that may increase the likelihood of getting the baby blues or postpartum depression include:  Having a personal or family history of depression.   Having depression while being pregnant.   Having premenstrual mood issues or mood issues related to oral contraceptives.  Having a lot of life stress.   Having marital conflict.   Lacking a social support network.   Having a baby with special needs.   Having health problems, such as diabetes.  SIGNS AND SYMPTOMS Symptoms of baby blues include:  Brief changes in mood, such as going from extreme happiness to sadness.  Decreased concentration.   Difficulty sleeping.   Crying spells, tearfulness.   Irritability.   Anxiety.  Symptoms of postpartum depression typically begin within the first month after giving birth. These symptoms include:  Difficulty sleeping or excessive sleepiness.   Marked weight loss.   Agitation.    Feelings of worthlessness.   Lack of interest in activity or food.  Postpartum psychosis is a very serious condition and can be dangerous. Fortunately, it is rare. Displaying  any of the following symptoms is cause for immediate medical attention. Symptoms of postpartum psychosis include:   Hallucinations and delusions.   Bizarre or disorganized behavior.   Confusion or disorientation.  DIAGNOSIS  A diagnosis is made by an evaluation of your symptoms. There are no medical or lab tests that lead to a diagnosis, but there are various questionnaires that a health care provider may use to identify those with the baby blues, postpartum depression, or psychosis. Often, a screening tool called the [place] Postnatal Depression Scale is used to diagnose depression in the postpartum period.  TREATMENT The baby blues usually goes away on its own in 1-2 weeks. Social support is often all that is needed. You will be encouraged to get adequate sleep and rest. Occasionally, you may be given medicines to help you sleep.  Postpartum depression requires treatment because it can last several months or longer if it is not treated. Treatment may include individual or group therapy, medicine, or both to address any social, physiological, and psychological factors that may play a role in the depression. Regular exercise, a healthy diet, rest, and social support may also be strongly recommended.  Postpartum psychosis is more serious and needs treatment right away. Hospitalization is often needed. HOME CARE INSTRUCTIONS  Get as much rest as you can. Nap when the baby sleeps.   Exercise regularly. Some women find yoga and walking to be beneficial.   Eat a balanced and nourishing diet.   Do little things that you enjoy. Have a cup of tea, take a bubble bath, read your favorite magazine, or listen to your favorite music.  Avoid alcohol.   Ask for help with household chores, cooking, grocery shopping,  or running errands as needed. Do not try to do everything.   Talk to people close to you about how you are feeling. Get support from your partner, family members, friends, or other new moms.  Try to stay positive in how you think. Think about the things you are grateful for.   Do not spend a lot of time alone.   Only take over-the-counter or prescription medicine as directed by your health care provider.  Keep all your postpartum appointments.   Let your health care provider know if you have any concerns.  SEEK MEDICAL CARE IF: You are having a reaction to or problems with your medicine. SEEK IMMEDIATE MEDICAL CARE IF:  You have suicidal feelings.   You think you may harm the baby or someone else. MAKE SURE YOU:  Understand these instructions.  Will watch your condition.  Will get help right away if you are not doing well or get worse. Document Released: 01/28/2004 Document Revised: 04/30/2013 Document Reviewed: 02/04/2013 [facility] Patient Information 2015 [place], [place]. This information is not intended to replace advice given to you by your health care provider. Make sure you discuss any questions you have with your health care provider. Breastfeeding and Mastitis Mastitis is inflammation of the breast tissue. It can occur in women who are breastfeeding. This can make breastfeeding painful. Mastitis will sometimes go away on its own. Your health care provider will help determine if treatment is needed. CAUSES Mastitis is often associated with a blocked milk (lactiferous) duct. This can happen when too much milk builds up in the breast. Causes of excess milk in the breast can include:  Poor latch-on. If your baby is not latched onto the breast properly, she or he may not empty your breast completely while breastfeeding.  Allowing too  much time to pass between feedings.  Wearing a bra or other clothing that is too tight. This puts extra pressure on the lactiferous ducts  so milk does not flow through them as it should. Mastitis can also be caused by a bacterial infection. Bacteria may enter the breast tissue through cuts or openings in the skin. In women who are breastfeeding, this may occur because of cracked or irritated skin. Cracks in the skin are often caused when your baby does not latch on properly to the breast. SIGNS AND SYMPTOMS  Swelling, redness, tenderness, and pain in an area of the breast.  Swelling of the glands under the arm on the same side.  Fever may or may not accompany mastitis. If an infection is allowed to progress, a collection of pus (abscess) may develop. DIAGNOSIS  Your health care provider can usually diagnose mastitis based on your symptoms and a physical exam. Tests may be done to help confirm the diagnosis. These may include:  Removal of pus from the breast by applying pressure to the area. This pus can be examined in the lab to determine which bacteria are present. If an abscess has developed, the fluid in the abscess can be removed with a needle. This can also be used to confirm the diagnosis and determine the bacteria present. In most cases, pus will not be present.  Blood tests to determine if your body is fighting a bacterial infection.  Mammogram or ultrasound tests to rule out other problems or diseases. TREATMENT  Mastitis that occurs with breastfeeding will sometimes go away on its own. Your health care provider may choose to wait 24 hours after first seeing you to decide whether a prescription medicine is needed. If your symptoms are worse after 24 hours, your health care provider will likely prescribe an antibiotic medicine to treat the mastitis. He or she will determine which bacteria are most likely causing the infection and will then select an appropriate antibiotic medicine. This is sometimes changed based on the results of tests performed to identify the bacteria, or if there is no response to the antibiotic medicine  selected. Antibiotic medicines are usually given by mouth. You may also be given medicine for pain. HOME CARE INSTRUCTIONS  Only take over-the-counter or prescription medicines for pain, fever, or discomfort as directed by your health care provider.  If your health care provider prescribed an antibiotic medicine, take the medicine as directed. Make sure you finish it even if you start to feel better.  Do not wear a tight or underwire bra. Wear a soft, supportive bra.  Increase your fluid intake, especially if you have a fever.  Continue to empty the breast. Your health care provider can tell you whether this milk is safe for your infant or needs to be thrown out. You may be told to stop nursing until your health care provider thinks it is safe for your baby. Use a breast pump if you are advised to stop nursing.  Keep your nipples clean and dry.  Empty the first breast completely before going to the other breast. If your baby is not emptying your breasts completely for some reason, use a breast pump to empty your breasts.  If you go back to work, pump your breasts while at work to stay in time with your nursing schedule.  Avoid allowing your breasts to become overly filled with milk (engorged). SEEK MEDICAL CARE IF:  You have pus-like discharge from the breast.  Your symptoms do not  improve with the treatment prescribed by your health care provider within 2 days. SEEK IMMEDIATE MEDICAL CARE IF:  Your pain and swelling are getting worse.  You have pain that is not controlled with medicine.  You have a red line extending from the breast toward your armpit.  You have a fever or persistent symptoms for more than 2-3 days.  You have a fever and your symptoms suddenly get worse. MAKE SURE YOU:   Understand these instructions.  Will watch your condition.  Will get help right away if you are not doing well or get worse. Document Released: 08/20/2004 Document Revised: 04/30/2013  Document Reviewed: 11/29/2012 [facility] Patient Information 2015 [place], [place]. This information is not intended to replace advice given to you by your health care provider. Make sure you discuss any questions you have with your health care provider. Breastfeeding Deciding to breastfeed is one of the best choices you can make for you and your baby. A change in hormones during pregnancy causes your breast tissue to grow and increases the number and size of your milk ducts. These hormones also allow proteins, sugars, and fats from your blood supply to make breast milk in your milk-producing glands. Hormones prevent breast milk from being released before your baby is born as well as prompt milk flow after birth. Once breastfeeding has begun, thoughts of your baby, as well as his or her sucking or crying, can stimulate the release of milk from your milk-producing glands.  BENEFITS OF BREASTFEEDING For Your Baby  Your first milk (colostrum) helps your baby's digestive system function better.   There are antibodies in your milk that help your baby fight off infections.   Your baby has a lower incidence of asthma, allergies, and sudden infant death syndrome.   The nutrients in breast milk are better for your baby than infant formulas and are designed uniquely for your baby's needs.   Breast milk improves your baby's brain development.   Your baby is less likely to develop other conditions, such as childhood obesity, asthma, or type 2 diabetes mellitus.  For You   Breastfeeding helps to create a very special bond between you and your baby.   Breastfeeding is convenient. Breast milk is always available at the correct temperature and costs nothing.   Breastfeeding helps to burn calories and helps you lose the weight gained during pregnancy.   Breastfeeding makes your uterus contract to its prepregnancy size faster and slows bleeding (lochia) after you give birth.   Breastfeeding helps to  lower your risk of developing type 2 diabetes mellitus, osteoporosis, and breast or ovarian cancer later in life. SIGNS THAT YOUR BABY IS HUNGRY Early Signs of Hunger  Increased alertness or activity.  Stretching.  Movement of the head from side to side.  Movement of the head and opening of the mouth when the corner of the mouth or cheek is stroked (rooting).  Increased sucking sounds, smacking lips, cooing, sighing, or squeaking.  Hand-to-mouth movements.  Increased sucking of fingers or hands. Late Signs of Hunger  Fussing.  Intermittent crying. Extreme Signs of Hunger Signs of extreme hunger will require calming and consoling before your baby will be able to breastfeed successfully. Do not wait for the following signs of extreme hunger to occur before you initiate breastfeeding:   Restlessness.  A loud, strong cry.   Screaming. BREASTFEEDING BASICS Breastfeeding Initiation  Find a comfortable place to sit or lie down, with your neck and back well supported.  Place a  pillow or rolled up blanket under your baby to bring him or her to the level of your breast (if you are seated). Nursing pillows are specially designed to help support your arms and your baby while you breastfeed.  Make sure that your baby's abdomen is facing your abdomen.   Gently massage your breast. With your fingertips, massage from your chest wall toward your nipple in a circular motion. This encourages milk flow. You may need to continue this action during the feeding if your milk flows slowly.  Support your breast with 4 fingers underneath and your thumb above your nipple. Make sure your fingers are well away from your nipple and your baby's mouth.   Stroke your baby's lips gently with your finger or nipple.   When your baby's mouth is open wide enough, quickly bring your baby to your breast, placing your entire nipple and as much of the colored area around your nipple (areola) as possible into  your baby's mouth.   More areola should be visible above your baby's upper lip than below the lower lip.   Your baby's tongue should be between his or her lower gum and your breast.   Ensure that your baby's mouth is correctly positioned around your nipple (latched). Your baby's lips should create a seal on your breast and be turned out (everted).  It is common for your baby to suck about 2-3 minutes in order to start the flow of breast milk. Latching Teaching your baby how to latch on to your breast properly is very important. An improper latch can cause nipple pain and decreased milk supply for you and poor weight gain in your baby. Also, if your baby is not latched onto your nipple properly, he or she may swallow some air during feeding. This can make your baby fussy. Burping your baby when you switch breasts during the feeding can help to get rid of the air. However, teaching your baby to latch on properly is still the best way to prevent fussiness from swallowing air while breastfeeding. Signs that your baby has successfully latched on to your nipple:    Silent tugging or silent sucking, without causing you pain.   Swallowing heard between every 3-4 sucks.    Muscle movement above and in front of his or her ears while sucking.  Signs that your baby has not successfully latched on to nipple:   Sucking sounds or smacking sounds from your baby while breastfeeding.  Nipple pain. If you think your baby has not latched on correctly, slip your finger into the corner of your baby's mouth to break the suction and place it between your baby's gums. Attempt breastfeeding initiation again. Signs of Successful Breastfeeding Signs from your baby:   A gradual decrease in the number of sucks or complete cessation of sucking.   Falling asleep.   Relaxation of his or her body.   Retention of a small amount of milk in his or her mouth.   Letting go of your breast by himself or  herself. Signs from you:  Breasts that have increased in firmness, weight, and size 1-3 hours after feeding.   Breasts that are softer immediately after breastfeeding.  Increased milk volume, as well as a change in milk consistency and color by the fifth day of breastfeeding.   Nipples that are not sore, cracked, or bleeding. Signs That Your Pecola LeisureBaby is Getting Enough Milk  Wetting at least 3 diapers in a 24-hour period. The urine should be  clear and pale yellow by age 15 days.  At least 3 stools in a 24-hour period by age 15 days. The stool should be soft and yellow.  At least 3 stools in a 24-hour period by age 38 days. The stool should be seedy and yellow.  No loss of weight greater than 10% of birth weight during the first 10 days of age.  Average weight gain of 4-7 ounces (113-198 g) per week after age 45 days.  Consistent daily weight gain by age 15 days, without weight loss after the age of 2 weeks. After a feeding, your baby may spit up a small amount. This is common. BREASTFEEDING FREQUENCY AND DURATION Frequent feeding will help you make more milk and can prevent sore nipples and breast engorgement. Breastfeed when you feel the need to reduce the fullness of your breasts or when your baby shows signs of hunger. This is called "breastfeeding on demand." Avoid introducing a pacifier to your baby while you are working to establish breastfeeding (the first 4-6 weeks after your baby is born). After this time you may choose to use a pacifier. Research has shown that pacifier use during the first year of a baby's life decreases the risk of sudden infant death syndrome (SIDS). Allow your baby to feed on each breast as long as he or she wants. Breastfeed until your baby is finished feeding. When your baby unlatches or falls asleep while feeding from the first breast, offer the second breast. Because newborns are often sleepy in the first few weeks of life, you may need to awaken your baby to get  him or her to feed. Breastfeeding times will vary from baby to baby. However, the following rules can serve as a guide to help you ensure that your baby is properly fed:  Newborns (babies 67 weeks of age or younger) may breastfeed every 1-3 hours.  Newborns should not go longer than 3 hours during the day or 5 hours during the night without breastfeeding.  You should breastfeed your baby a minimum of 8 times in a 24-hour period until you begin to introduce solid foods to your baby at around 72 months of age. BREAST MILK PUMPING Pumping and storing breast milk allows you to ensure that your baby is exclusively fed your breast milk, even at times when you are unable to breastfeed. This is especially important if you are going back to work while you are still breastfeeding or when you are not able to be present during feedings. Your lactation consultant can give you guidelines on how long it is safe to store breast milk.  A breast pump is a machine that allows you to pump milk from your breast into a sterile bottle. The pumped breast milk can then be stored in a refrigerator or freezer. Some breast pumps are operated by hand, while others use electricity. Ask your lactation consultant which type will work best for you. Breast pumps can be purchased, but some hospitals and breastfeeding support groups lease breast pumps on a monthly basis. A lactation consultant can teach you how to hand express breast milk, if you prefer not to use a pump.  CARING FOR YOUR BREASTS WHILE YOU BREASTFEED Nipples can become dry, cracked, and sore while breastfeeding. The following recommendations can help keep your breasts moisturized and healthy:  Avoid using soap on your nipples.   Wear a supportive bra. Although not required, special nursing bras and tank tops are designed to allow access to your breasts  for breastfeeding without taking off your entire bra or top. Avoid wearing underwire-style bras or extremely tight  bras.  Air dry your nipples for 3-38minutes after each feeding.   Use only cotton bra pads to absorb leaked breast milk. Leaking of breast milk between feedings is normal.   Use lanolin on your nipples after breastfeeding. Lanolin helps to maintain your skin's normal moisture barrier. If you use pure lanolin, you do not need to wash it off before feeding your baby again. Pure lanolin is not toxic to your baby. You may also hand express a few drops of breast milk and gently massage that milk into your nipples and allow the milk to air dry. In the first few weeks after giving birth, some women experience extremely full breasts (engorgement). Engorgement can make your breasts feel heavy, warm, and tender to the touch. Engorgement peaks within 3-5 days after you give birth. The following recommendations can help ease engorgement:  Completely empty your breasts while breastfeeding or pumping. You may want to start by applying warm, moist heat (in the shower or with warm water-soaked hand towels) just before feeding or pumping. This increases circulation and helps the milk flow. If your baby does not completely empty your breasts while breastfeeding, pump any extra milk after he or she is finished.  Wear a snug bra (nursing or regular) or tank top for 1-2 days to signal your body to slightly decrease milk production.  Apply ice packs to your breasts, unless this is too uncomfortable for you.  Make sure that your baby is latched on and positioned properly while breastfeeding. If engorgement persists after 48 hours of following these recommendations, contact your health care provider or a Advertising copywriter. OVERALL HEALTH CARE RECOMMENDATIONS WHILE BREASTFEEDING  Eat healthy foods. Alternate between meals and snacks, eating 3 of each per day. Because what you eat affects your breast milk, some of the foods may make your baby more irritable than usual. Avoid eating these foods if you are sure that  they are negatively affecting your baby.  Drink milk, fruit juice, and water to satisfy your thirst (about 10 glasses a day).   Rest often, relax, and continue to take your prenatal vitamins to prevent fatigue, stress, and anemia.  Continue breast self-awareness checks.  Avoid chewing and smoking tobacco.  Avoid alcohol and drug use. Some medicines that may be harmful to your baby can pass through breast milk. It is important to ask your health care provider before taking any medicine, including all over-the-counter and prescription medicine as well as vitamin and herbal supplements. It is possible to become pregnant while breastfeeding. If birth control is desired, ask your health care provider about options that will be safe for your baby. SEEK MEDICAL CARE IF:   You feel like you want to stop breastfeeding or have become frustrated with breastfeeding.  You have painful breasts or nipples.  Your nipples are cracked or bleeding.  Your breasts are red, tender, or warm.  You have a swollen area on either breast.  You have a fever or chills.  You have nausea or vomiting.  You have drainage other than breast milk from your nipples.  Your breasts do not become full before feedings by the fifth day after you give birth.  You feel sad and depressed.  Your baby is too sleepy to eat well.  Your baby is having trouble sleeping.   Your baby is wetting less than 3 diapers in a 24-hour period.  Your  baby has less than 3 stools in a 24-hour period.  Your baby's skin or the white part of his or her eyes becomes yellow.   Your baby is not gaining weight by 59 days of age. SEEK IMMEDIATE MEDICAL CARE IF:   Your baby is overly tired (lethargic) and does not want to wake up and feed.  Your baby develops an unexplained fever. Document Released: 04/25/2005 Document Revised: 04/30/2013 Document Reviewed: 10/17/2012 [facility] Patient Information 2015 [place], [place]. This information  is not intended to replace advice given to you by your health care provider. Make sure you discuss any questions you have with your health care provider.

## 2014-09-24 NOTE — Lactation Note (Signed)
This note was copied from the chart of Brooke Roselinda [name]. Lactation Consultation Note  Follow up visit made prior to discharge.  Mom states she is obtaining drops with pumping/hand expression.  Instructed to continue pumping every 3 hours and bring her pump pieces with her to the hospital.  She has a DEBP at home.  Encouraged to call with concerns/assist prn.  Patient Name: Brooke [name] Today's Date: 09/24/2014     Maternal Data    Feeding Feeding Type: Formula Length of feed: 30 min  LATCH Score/Interventions                      Lactation Tools Discussed/Used     Consult Status      Huston FoleyMOULDEN, Khaleel Beckom S 09/24/2014, 1:02 PM

## 2014-09-24 NOTE — Progress Notes (Signed)
[facility] while rounding on the unit.  She shared with me her pregnancy and birthing story, her determination and her hope and faith that carried her through.  She is coping well but is eager to go home today.  She lives about 40 minutes away, but stated that it would not be an issue [place].    We will continue to follow up as we see her in the NICU, but please page as needs arise.  Centex CorporationChaplain Katy Elyse Prevo Pager, [phone] 10:36 AM    09/24/14 1000  Clinical Encounter Type  Visited With Patient  Visit Type Spiritual support  Spiritual Encounters  Spiritual Needs Emotional

## 2014-09-24 NOTE — Progress Notes (Signed)

## 2014-09-24 NOTE — Discharge Summary (Signed)
Obstetric Discharge Summary  Reason for Admission: Pt is a G2P0101 at 2776w3d admitted at 24 wks for dynamic cervix with CL <1cm. Hospitalized 74 days.  [facility].  [facility] since 8.6 wks, with Dr. Billy Coastaavon as primary provider.  Medications on Admission: Prescriptions prior to admission  Medication Sig Dispense Refill Last Dose  . acetaminophen (TYLENOL) 500 MG tablet Take 1,000 mg by mouth every 4 (four) hours as needed for mild pain or moderate pain.   07/09/2014 at Unknown time  . folic acid (FOLVITE) 400 MCG tablet Take 800 mcg by mouth at bedtime.    07/09/2014 at Unknown time  . metFORMIN (GLUCOPHAGE) 500 MG tablet Take 500 mg by mouth at bedtime.   07/09/2014 at Unknown time  . Prenat w/o A-FE-DSS-Methfol-FA (PRENATAL MULTIVITAMIN) 90-600-400 MG-MCG-MCG tablet Take 1 tablet by mouth at bedtime.    07/09/2014 at Unknown time  . progesterone (PROMETRIUM) 200 MG capsule Place 1 capsule (200 mg total) vaginally at bedtime. 30 capsule 3 07/09/2014 at Unknown time  . ranitidine (ZANTAC) 150 MG tablet Take 150 mg by mouth 2 (two) times daily as needed for heartburn.    07/09/2014 at Unknown time  . HYDROcodone-acetaminophen (NORCO/VICODIN) 5-325 MG per tablet Take 1 tablet by mouth every 4 (four) hours as needed for moderate pain. (Patient not taking: Reported on 07/10/2014) 12 tablet 0   . loratadine (CLARITIN) 10 MG tablet Take 10 mg by mouth daily as needed for allergies.   prn  . ondansetron (ZOFRAN ODT) 8 MG disintegrating tablet Take 1 tablet (8 mg total) by mouth every 8 (eight) hours as needed for nausea or vomiting. (Patient not taking: Reported on 07/10/2014) 10 tablet 0     Prenatal Labs: ABO, Rh: B POS (05/15 0800) Antibody: NEG (05/15 0800) Rubella: Immune (05/17 0745)  RPR: Non Reactive (05/17 0745)  HBsAg: Negative (12/04 0000)  HIV: Non-reactive (12/04 0000)  GTT : Abnormal 1 hr GTT / Normal 3 hr GTT GBS: Negative (02/18 0000)   Prenatal  Procedures: cerclage and ultrasound Hospital Course: Cervical insufficiency at 22 wks / admitted to hospital for rescue cerclage / hospitalized x 5 days then d/c'd home / readmitted 2 wks later with dynamic cervix, intact cerclage with CL <1cm / BMZ x 2 doses / presumed PPROM on 4/11 at 29.3 wks and normal AFI / PPROM abx protocol initiated / hospitalized x 74 days  Intrapartum Course: IOL on hospital day #75 / cerclage removed without difficulty by Dr. Billy Coastaavon / pitocin induction / rapid progression to complete dilation / SVD of preterm female with 2nd degree repair by Dr. Billy Coastaavon / immediate evaluation of infant my NICU team / NICU admission of infant / maternal postpartum complications noted: decreased diuresis d/t pt refusing to drink plenty of fluids, improved diuresis once adequate hydration was achieved Intrapartum Procedures: spontaneous vaginal delivery Postpartum Procedures: none Complications-Operative and Postpartum: 2nd degree perineal laceration  Labs: HEMOGLOBIN  Date Value Ref Range Status  09/23/2014 12.5 12.0 - 15.0 g/dL Final   HCT  Date Value Ref Range Status  09/23/2014 36.7 36.0 - 46.0 % Final   Lab Results  Component Value Date   PLT 169 09/23/2014    Newborn Data: Live born female  Birth Weight: 5 lb 6.8 oz (2460 g) APGAR: 6, 7  Infant in NICU.   Discharge Information: Date: 09/24/2014 Discharge Diagnoses:  Pt is a G2P0101 at 1276w3d S/P spontaneous vaginal of preterm infant on 09/22/2014  Condition: stable Activity: pelvic rest Diet: routine Medications:    Medication List    STOP taking these medications        metFORMIN 500 MG tablet  Commonly known as:  GLUCOPHAGE  Replaced by:  metFORMIN 750 MG 24 hr tablet     ondansetron 8 MG disintegrating tablet  Commonly known as:  ZOFRAN ODT     progesterone 200 MG capsule  Commonly known as:  PROMETRIUM     ranitidine 150 MG tablet  Commonly known as:  ZANTAC      TAKE these medications         acetaminophen 500 MG tablet  Commonly known as:  TYLENOL  Take 1,000 mg by mouth every 4 (four) hours as needed for mild pain or moderate pain.     folic acid 400 MCG tablet  Commonly known as:  FOLVITE  Take 800 mcg by mouth at bedtime.     HYDROcodone-acetaminophen 5-325 MG per tablet  Commonly known as:  NORCO/VICODIN  Take 1 tablet by mouth every 4 (four) hours as needed for moderate pain.     ibuprofen 600 MG tablet  Commonly known as:  ADVIL,MOTRIN  Take 1 tablet (600 mg total) by mouth every 6 (six) hours.     loratadine 10 MG tablet  Commonly known as:  CLARITIN  Take 10 mg by mouth daily as needed for allergies.     metFORMIN 750 MG 24 hr tablet  Commonly known as:  GLUCOPHAGE XR  Take 1 tablet (750 mg total) by mouth daily with breakfast.     pantoprazole 20 MG tablet  Commonly known as:  PROTONIX  Take 1 tablet (20 mg total) by mouth daily.     prenatal multivitamin 90-600-400 MG-MCG-MCG tablet  Take 1 tablet by mouth at bedtime.       Instructions: The [facility] instruction booklet has been given and reviewed Discharge to: home     Follow-up Information    Follow up with Lenoard AdenAAVON,RICHARD J, MD. Schedule an appointment as soon as possible for a visit in 1 week.   Specialty:  Obstetrics and Gynecology   Why:  blood pressure recheck   Contact information:   [address] [place] [address] [phone]       Follow up with Lenoard AdenAAVON,RICHARD J, MD. Schedule an appointment as soon as possible for a visit in 6 weeks.   Specialty:  Obstetrics and Gynecology   Why:  postpartum visit   Contact information:   [address] [place] [id number] [phone]       Raelyn MoraAWSON, Evanna Washinton, Judie PetitM, MSN, CNM 09/24/2014, 11:57 AM

## 2014-09-25 NOTE — Progress Notes (Signed)
Ur chart review completed.  

## 2014-09-26 ENCOUNTER — Ambulatory Visit: Payer: Self-pay

## 2014-09-26 ENCOUNTER — Telehealth: Payer: Self-pay | Admitting: Neurology

## 2014-09-26 ENCOUNTER — Emergency Department (HOSPITAL_COMMUNITY)
Admission: EM | Admit: 2014-09-26 | Discharge: 2014-09-26 | Disposition: A | Discharge status: Home / Self Care | Payer: 59 | Attending: Emergency Medicine | Admitting: Emergency Medicine

## 2014-09-26 ENCOUNTER — Encounter (HOSPITAL_COMMUNITY): Payer: Self-pay | Admitting: *Deleted

## 2014-09-26 DIAGNOSIS — I10 Essential (primary) hypertension: Secondary | ICD-10-CM | POA: Diagnosis not present

## 2014-09-26 DIAGNOSIS — Z8659 Personal history of other mental and behavioral disorders: Secondary | ICD-10-CM | POA: Diagnosis not present

## 2014-09-26 DIAGNOSIS — Z8639 Personal history of other endocrine, nutritional and metabolic disease: Secondary | ICD-10-CM | POA: Diagnosis not present

## 2014-09-26 DIAGNOSIS — Z79899 Other long term (current) drug therapy: Secondary | ICD-10-CM | POA: Diagnosis not present

## 2014-09-26 DIAGNOSIS — G43909 Migraine, unspecified, not intractable, without status migrainosus: Secondary | ICD-10-CM | POA: Diagnosis not present

## 2014-09-26 DIAGNOSIS — G40409 Other generalized epilepsy and epileptic syndromes, not intractable, without status epilepticus: Secondary | ICD-10-CM | POA: Diagnosis not present

## 2014-09-26 DIAGNOSIS — R569 Unspecified convulsions: Secondary | ICD-10-CM | POA: Diagnosis not present

## 2014-09-26 LAB — COMPREHENSIVE METABOLIC PANEL WITH GFR
ALT: 29 U/L (ref 14–54)
AST: 36 U/L (ref 15–41)
Albumin: 2.8 g/dL — ABNORMAL LOW (ref 3.5–5.0)
Alkaline Phosphatase: 71 U/L (ref 38–126)
Anion gap: 8 (ref 5–15)
BUN: <5 mg/dL — ABNORMAL LOW (ref 6–20)
CO2: 23 mmol/L (ref 22–32)
Calcium: 9 mg/dL (ref 8.9–10.3)
Chloride: 109 mmol/L (ref 101–111)
Creatinine, Ser: 0.63 mg/dL (ref 0.44–1.00)
GFR calc Af Amer: >60 mL/min
GFR calc non Af Amer: >60 mL/min
Glucose, Bld: 114 mg/dL — ABNORMAL HIGH (ref 65–99)
Potassium: 3.9 mmol/L (ref 3.5–5.1)
Sodium: 140 mmol/L (ref 135–145)
Total Bilirubin: 0.1 mg/dL — ABNORMAL LOW (ref 0.3–1.2)
Total Protein: 5.6 g/dL — ABNORMAL LOW (ref 6.5–8.1)

## 2014-09-26 LAB — CBC WITH DIFFERENTIAL/PLATELET
Basophils Absolute: 0 10*3/uL (ref 0.0–0.1)
Basophils Relative: 0 % (ref 0–1)
Eosinophils Absolute: 0.2 10*3/uL (ref 0.0–0.7)
Eosinophils Relative: 2 % (ref 0–5)
HCT: 36.1 % (ref 36.0–46.0)
Hemoglobin: 12.4 g/dL (ref 12.0–15.0)
Lymphocytes Relative: 21 % (ref 12–46)
Lymphs Abs: 2 10*3/uL (ref 0.7–4.0)
MCH: 31.2 pg (ref 26.0–34.0)
MCHC: 34.3 g/dL (ref 30.0–36.0)
MCV: 90.9 fL (ref 78.0–100.0)
Monocytes Absolute: 0.6 10*3/uL (ref 0.1–1.0)
Monocytes Relative: 7 % (ref 3–12)
Neutro Abs: 6.5 10*3/uL (ref 1.7–7.7)
Neutrophils Relative %: 70 % (ref 43–77)
Platelets: 231 10*3/uL (ref 150–400)
RBC: 3.97 MIL/uL (ref 3.87–5.11)
RDW: 13.7 % (ref 11.5–15.5)
WBC: 9.3 10*3/uL (ref 4.0–10.5)

## 2014-09-26 MED ORDER — TOPIRAMATE 50 MG PO TABS: 50.0000 mg | ORAL_TABLET | Freq: Two times a day (BID) | ORAL | Status: DC

## 2014-09-26 NOTE — Telephone Encounter (Signed)
I've been in the room all morning with patients.  The patient is currently checked into the ER.  I cannot see her today so I recommend if an emergency that she stay in the ER.  She hasn't been seen in a year and a half, and we recommended then that she stay on meds.  She decided not to, against advice, which is absolutely her right (I know she was pregnant but we still recommended she stay on med) but this is why we wanted her on med.   I'm happy to see her on Tuesday afternoon.  Please put her on my schedule in the open slot on Tuesday afternoon.

## 2014-09-26 NOTE — Lactation Note (Signed)
This note was copied from the chart of Brooke [name]. Lactation Consultation Note Follow up visit with mom in NICU. 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  [facility] and now is back on her anti seizure medication.  It was difficult for her to pump through the night.  She last pumped at 0800 (about 7 hours ago.)  She is c/o of her left breast feeling hard and painful.  On exam right breast is filling and soft but left breast is firm, slightly red and warm to touch.  No fever or flu like symptoms.  I discussed engorgement with mom and assisted her with pumping.  Firm areas massaged during pumping and she obtained total of 30 mls/20 minutes.  Left breast much softer after pumping and mom feeling better.  Ice packs given to apply to breasts every 2 hours for 30 minutes.  I also recommended she take ibuprofen as directed by MD.  Stressed importance of pumping every 3 hours until milk stops dripping.  Encouraged to call for further concerns/assist.  Patient Name: Brooke Rumi [name] Today's Date: 09/26/2014     Maternal Data    Feeding    LATCH Score/Interventions                      Lactation Tools Discussed/Used     Consult Status      Huston FoleyMOULDEN, Asante Ritacco S 09/26/2014, 4:07 PM

## 2014-09-26 NOTE — ED Notes (Signed)
Pt reports hx of seizures, has been off meds x 2 years due to pregnancy, had baby on Monday and reports having continuous seizure activity since then. No acute distress noted at triage.

## 2014-09-26 NOTE — Telephone Encounter (Signed)
Pt delivered baby on Monday. Previous [place] [facility] patient and also seen by Dr. Arbutus Leasat. Wants follow up appt today with Dr. Arbutus Leasat for sz. Please call patient [phone] / Oneita KrasSherri S.

## 2014-09-26 NOTE — Telephone Encounter (Signed)
Called patient back and it went straight to voicemail. Left message on machine making patient aware of message below. She is to call back and make follow up for Tuesday afternoon.

## 2014-09-26 NOTE — Telephone Encounter (Signed)
Please advise 

## 2014-09-26 NOTE — Discharge Instructions (Signed)

## 2014-09-26 NOTE — Consult Note (Signed)
NEURO HOSPITALIST CONSULT NOTE    Reason for Consult: seizues  HPI:                                                                                                                                          Brooke [name] is an 30 y.o. female with a past medical history significant for HTN, polycystic ovary disease, migraine, and juvenile myoclonic epilepsy since age 60 years old, comes in for evaluation of increased seizure frequency. She had a baby (born at 45 weeks) on Monday and said that " my seizures came back since yesterday'. Patient elected to go off her AED (topamax) x almost 2 years and denies having seizures during her pregnancy. She indicated that she is having " just the jerking seizures in my arms, very frequently since yesterday". Stated that Lamictal and Depakote did help to control her seizures but had side effects, and keppra " make an angry, a different person". Topamax was effective controlling her seizures but she decided not to take it during pregnancy. Denies HA, vertigo, double vision, difficulty swallowing, focal weakness, slurred speech, language or vision impairment. No recent fever, infection, or medical illnesses. Her baby is in the ICU and she said that she is sleeping well.   Past Medical History  Diagnosis Date  . Hypertension   . Epilepsy     Last seizure 3 weeks ago  . Polycystic disease, ovaries   . Migraines   . Anxiety   . PCOS (polycystic ovarian syndrome)     Past Surgical History  Procedure Laterality Date  . Tonsillectomy    . Cervical cerclage N/A 06/26/2014    Procedure: CERCLAGE CERVICAL;  Surgeon: Lovenia Kim, MD;  Location: [facility];  Service: Gynecology;  Laterality: N/A;  . Wisdom tooth extraction      Family History  Problem Relation Age of Onset  . Diabetes Mother   . Hypertension Mother   . Hypertension Father   . Alcohol abuse Maternal Grandmother   . Hearing loss Paternal Grandfather      Family History: no MS, brain tumors, or brain aneurysms.   Social History:  reports that she has never smoked. She has never used smokeless tobacco. She reports that she does not drink alcohol or use illicit drugs.  Allergies  Allergen Reactions  . Codeine Itching    MEDICATIONS:  I have reviewed the patient's current medications.   ROS:                                                                                                                                       History obtained from the patient and chart review.  General ROS: negative for - chills, fatigue, fever, night sweats, or weight loss Psychological ROS: negative for - behavioral disorder, hallucinations, memory difficulties, or suicidal ideation Ophthalmic ROS: negative for - blurry vision, double vision, eye pain or loss of vision ENT ROS: negative for - epistaxis, nasal discharge, oral lesions, sore throat, tinnitus or vertigo Allergy and Immunology ROS: negative for - hives or itchy/watery eyes Hematological and Lymphatic ROS: negative for - bleeding problems, bruising or swollen lymph nodes Endocrine ROS: negative for - galactorrhea, hair pattern changes, polydipsia/polyuria or temperature intolerance Respiratory ROS: negative for - cough, hemoptysis, shortness of breath or wheezing Cardiovascular ROS: negative for - chest pain, dyspnea on exertion, edema or irregular heartbeat Gastrointestinal ROS: negative for - abdominal pain, diarrhea, hematemesis, nausea/vomiting or stool incontinence Genito-Urinary ROS: negative for - dysuria, hematuria, incontinence or urinary frequency/urgency Musculoskeletal ROS: negative for - joint swelling or muscular weakness Neurological ROS: as noted in HPI Dermatological ROS: negative for rash and skin lesion changes    Physical exam: pleasant female in no  apparent distress.Blood pressure 147/89, pulse 106, temperature 98.4 F (36.9 C), temperature source Oral, resp. rate 31, height 5' 5" (1.651 m), weight 106.369 kg (234 lb 8 oz), last menstrual period 12/12/2013, SpO2 96 %, unknown if currently breastfeeding.  Head: normocephalic. Neck: supple, no bruits, no JVD. Cardiac: no murmurs. Lungs: clear. Abdomen: soft, no tender, no mass. Extremities: no edema. Skin: no rash  Neurologic Examination:                                                                                                      General: Mental Status: Alert, oriented, thought content appropriate.  Speech fluent without evidence of aphasia.  Able to follow 3 step commands without difficulty. Cranial Nerves: II: Discs flat bilaterally; Visual fields grossly normal, pupils equal, round, reactive to light and accommodation III,IV, VI: ptosis not present, extra-ocular motions intact bilaterally V,VII: smile symmetric, facial light touch sensation normal bilaterally VIII: hearing normal bilaterally IX,X: uvula rises symmetrically XI: bilateral shoulder shrug XII: midline tongue extension without atrophy or fasciculations  Motor: Right : Upper extremity   5/5    Left:     Upper  extremity   5/5  Lower extremity   5/5     Lower extremity   5/5 Tone and bulk:normal tone throughout; no atrophy noted Sensory: Pinprick and light touch intact throughout, bilaterally Deep Tendon Reflexes:  Right: Upper Extremity   Left: Upper extremity   biceps (C-5 to C-6) 2/4   biceps (C-5 to C-6) 2/4 tricep (C7) 2/4    triceps (C7) 2/4 Brachioradialis (C6) 2/4  Brachioradialis (C6) 2/4  Lower Extremity Lower Extremity  quadriceps (L-2 to L-4) 2/4   quadriceps (L-2 to L-4) 2/4 Achilles (S1) 2/4   Achilles (S1) 2/4  Plantars: Right: downgoing   Left: downgoing Cerebellar: normal finger-to-nose,  normal heel-to-shin test Gait:  No ataxia.    No results found for: CHOL  Results for  orders placed or performed during the hospital encounter of 09/26/14 (from the past 48 hour(s))  Comprehensive metabolic panel     Status: Abnormal   Collection Time: 09/26/14 12:34 PM  Result Value Ref Range   Sodium 140 135 - 145 mmol/L   Potassium 3.9 3.5 - 5.1 mmol/L   Chloride 109 101 - 111 mmol/L   CO2 23 22 - 32 mmol/L   Glucose, Bld 114 (H) 65 - 99 mg/dL   BUN <5 (L) 6 - 20 mg/dL   Creatinine, Ser 0.63 0.44 - 1.00 mg/dL   Calcium 9.0 8.9 - 10.3 mg/dL   Total Protein 5.6 (L) 6.5 - 8.1 g/dL   Albumin 2.8 (L) 3.5 - 5.0 g/dL   AST 36 15 - 41 U/L   ALT 29 14 - 54 U/L   Alkaline Phosphatase 71 38 - 126 U/L   Total Bilirubin 0.1 (L) 0.3 - 1.2 mg/dL   GFR calc non Af Amer >60 >60 mL/min   GFR calc Af Amer >60 >60 mL/min    Comment: (NOTE) The eGFR has been calculated using the CKD EPI equation. This calculation has not been validated in all clinical situations. eGFR's persistently <60 mL/min signify possible Chronic Kidney Disease.    Anion gap 8 5 - 15  CBC with Differential     Status: None   Collection Time: 09/26/14 12:34 PM  Result Value Ref Range   WBC 9.3 4.0 - 10.5 K/uL   RBC 3.97 3.87 - 5.11 MIL/uL   Hemoglobin 12.4 12.0 - 15.0 g/dL   HCT 36.1 36.0 - 46.0 %   MCV 90.9 78.0 - 100.0 fL   MCH 31.2 26.0 - 34.0 pg   MCHC 34.3 30.0 - 36.0 g/dL   RDW 13.7 11.5 - 15.5 %   Platelets 231 150 - 400 K/uL   Neutrophils Relative % 70 43 - 77 %   Neutro Abs 6.5 1.7 - 7.7 K/uL   Lymphocytes Relative 21 12 - 46 %   Lymphs Abs 2.0 0.7 - 4.0 K/uL   Monocytes Relative 7 3 - 12 %   Monocytes Absolute 0.6 0.1 - 1.0 K/uL   Eosinophils Relative 2 0 - 5 %   Eosinophils Absolute 0.2 0.0 - 0.7 K/uL   Basophils Relative 0 0 - 1 %   Basophils Absolute 0.0 0.0 - 0.1 K/uL    No results found.  Assessment/Plan: 29 y/o with known JME, comes in due to recurrent myoclonic seizures since yesterday. She has been off her AED (topamax 100 mg BID) for more than 1.5 years due to  pregnancy. Neuro-exam non focal. Reports good seizure control even when she was taking a lower dose of keppra. I recommended  resuming topamax at a lower dose (50 mg BID) which she said was also able to provide good seizure control before. She tells me that she is planning to breastfeed her baby and is concerned about taking topamax. I told her that this is a justifiable concern, reviewed with her the available data regarding AED use and breastfeeding, and informed patient that overall this issue has been understudied and although Topamax possibly penetrate into breast milk, at this point and time there is not solid, consistent data documenting  accumulation and detrimental effect on the newborn. It is worth noting that the current literature indicate that Lamictal is a safer drug to use during breastfeeding but unfortunately patient had had intolerable side effects to lamictal before. Further, Depakote seems to be less able to cross into the breat milk, but patient also reports side effects to Depakote.  Needs outpatient neurology follow up in 1-2 weeks to further address close monitoring of epilepsy treatment during breastfeeding.  Dorian Pod, MD 09/26/2014, 1:16 PM  [facility]

## 2014-09-26 NOTE — ED Provider Notes (Signed)
CSN: [id number]     Arrival date & time 09/26/14  1134 History   First MD Initiated Contact with Patient 09/26/14 1154     Chief Complaint  Patient presents with  . Seizures     (Consider location/radiation/quality/duration/timing/severity/associated sxs/prior Treatment) Patient is a 29 y.o. female presenting with seizures. The history is provided by the patient.  Seizures Seizure activity on arrival: no    patient with history of juvenile myoclonic seizures. Delivered a baby 6 days ago. Or last few days she's been having more frequent seizures. States there were rather rare before the recent episodes. They have been adjusting medicines on her recently and she had been off her Topamax which had been helping that she was attempting to get pregnant. No confusion and no headache. She states she has had an elevated blood pressure during the end of the pregnancy. She was on bed rest in the hospital for around 70 days.  Past Medical History  Diagnosis Date  . Hypertension   . Epilepsy     Last seizure 3 weeks ago  . Polycystic disease, ovaries   . Migraines   . Anxiety   . PCOS (polycystic ovarian syndrome)    Past Surgical History  Procedure Laterality Date  . Tonsillectomy    . Cervical cerclage N/A 06/26/2014    Procedure: CERCLAGE CERVICAL;  Surgeon: Lenoard Adenichard J Taavon, MD;  Location: [facility];  Service: Gynecology;  Laterality: N/A;  . Wisdom tooth extraction     Family History  Problem Relation Age of Onset  . Diabetes Mother   . Hypertension Mother   . Hypertension Father   . Alcohol abuse Maternal Grandmother   . Hearing loss Paternal Grandfather    History  Substance Use Topics  . Smoking status: Never Smoker   . Smokeless tobacco: Never Used  . Alcohol Use: No   OB History    Gravida Para Term Preterm AB TAB SAB Ectopic Multiple Living   2 1  1      0 1     Review of Systems  Constitutional: Negative for activity change and appetite change.  Eyes: Negative for pain.   Respiratory: Negative for chest tightness and shortness of breath.   Cardiovascular: Negative for chest pain and leg swelling.  Gastrointestinal: Negative for nausea, vomiting, abdominal pain and diarrhea.  Genitourinary: Negative for flank pain.       Breast pain  Musculoskeletal: Negative for back pain and neck stiffness.  Skin: Negative for rash.  Neurological: Positive for seizures. Negative for weakness, numbness and headaches.       Patient with myoclonic seizures and occasional confusion after.  Psychiatric/Behavioral: Negative for behavioral problems.      Allergies  Codeine  Home Medications   Prior to Admission medications   Medication Sig Start Date End Date Taking? Authorizing Provider  acetaminophen (TYLENOL) 500 MG tablet Take 1,000 mg by mouth every 4 (four) hours as needed for mild pain or moderate pain.   Yes Historical Provider, MD  folic acid (FOLVITE) 400 MCG tablet Take 800 mcg by mouth at bedtime.    Yes Historical Provider, MD  ibuprofen (ADVIL,MOTRIN) 600 MG tablet Take 1 tablet (600 mg total) by mouth every 6 (six) hours. 09/24/14  Yes Raelyn Moraolitta Dawson, CNM  loratadine (CLARITIN) 10 MG tablet Take 10 mg by mouth daily as needed for allergies.   Yes Historical Provider, MD  metFORMIN (GLUCOPHAGE XR) 750 MG 24 hr tablet Take 1 tablet (750 mg total) by mouth daily  with breakfast. 09/24/14  Yes Raelyn Moraolitta Dawson, CNM  pantoprazole (PROTONIX) 20 MG tablet Take 1 tablet (20 mg total) by mouth daily. 09/24/14  Yes Raelyn Moraolitta Dawson, CNM  Prenat w/o A-FE-DSS-Methfol-FA (PRENATAL MULTIVITAMIN) 90-600-400 MG-MCG-MCG tablet Take 1 tablet by mouth at bedtime.    Yes Historical Provider, MD  HYDROcodone-acetaminophen (NORCO/VICODIN) 5-325 MG per tablet Take 1 tablet by mouth every 4 (four) hours as needed for moderate pain. Patient not taking: Reported on 07/10/2014 07/06/13   Azalia BilisKevin Campos, MD  topiramate (TOPAMAX) 50 MG tablet Take 1 tablet (50 mg total) by mouth 2 (two) times daily.  09/26/14   Benjiman CoreNathan Hazeline Charnley, MD   BP 147/93 mmHg  Pulse 97  Temp(Src) 98.4 F (36.9 C) (Oral)  Resp 26  Ht 5\' 5"  (1.651 m)  Wt 234 lb 8 oz (106.369 kg)  BMI 39.02 kg/m2  SpO2 97%  LMP 12/12/2013 (LMP Unknown) Physical Exam  Constitutional: She appears well-developed.  HENT:  Head: Atraumatic.  Neck: Neck supple.  Cardiovascular: Normal rate.   Pulmonary/Chest: Effort normal.  Bilateral breasts engorged. Some redness particularly on the left. Does not appear to be severe infection. Recommended more breast pumping at home.  Abdominal: Soft.  Neurological: She is alert. No cranial nerve deficit.  Psychiatric: She has a normal mood and affect.    ED Course  Procedures (including critical care time) Labs Review Labs Reviewed  COMPREHENSIVE METABOLIC PANEL - Abnormal; Notable for the following:    Glucose, Bld 114 (*)    BUN <5 (*)    Total Protein 5.6 (*)    Albumin 2.8 (*)    Total Bilirubin 0.1 (*)    All other components within normal limits  CBC WITH DIFFERENTIAL/PLATELET    Imaging Review No results found.   EKG Interpretation None      MDM   Final diagnoses:  Myoclonic epilepsy    A shared with myoclonic seizures. History of same. His 6 days postpartum. Has been seen by neurology and will restart her Topamax. Os was breast pain. Does not appear to be a mastitis at this time but will need more pumping.  f  Benjiman CoreNathan Mesha Schamberger, MD 09/26/14 1344

## 2014-09-26 NOTE — Telephone Encounter (Signed)
Patient calling back to check on todays appointment????? Call back number [phone]

## 2014-09-30 ENCOUNTER — Ambulatory Visit: Payer: Self-pay

## 2014-09-30 NOTE — Lactation Note (Signed)
This note was copied from the chart of [name]. Lactation Consultation Note  Patient Name: [name] [id number]'s Date: 09/30/2014 Reason for consult: Follow-up assessment;NICU baby  NICU baby 8 days of life. Called to assist mom with pumping and to discuss milk supply. Mom is getting an ounce at most when she pumps now. Discussed that mom has not been able to pump 8 times/24 hours on a routine basis. Enc mom to pump at least 8 times a day for 15 minutes. Enc mom to use a sports bra she can convert for hands-free pumping, so that mom can use both hands to massage each breast while pumping. Discussed power-pumping in the evening, and using her phone which has pictures of her baby and audio of the baby crying. Discussed the benefits of the EBM that mom is getting to the baby and to herself. Mom given hands discussed [place], Fenugreek, and breast milk cookies recipe. Gave mom lots of encouragement, and discussed continuing to offer as much STS and nuzzling at the breast to the baby as she can. Mom is using #27 flanges and states that they are more comfortable than the #21s. Mom asked about using petroleum jelly on her nipple, and was enc to use coconut oil or extra virgin olive oil. Enc mom to call for assistance as needed.  Maternal Data    Feeding Feeding Type: Formula Length of feed: 60 min  LATCH Score/Interventions                      Lactation Tools Discussed/Used     Consult Status Consult Status: PRN    Geralynn OchsWILLIARD, Chantia Amalfitano 09/30/2014, 4:40 PM

## 2014-09-30 NOTE — Lactation Note (Signed)
This note was copied from the chart of Boy Ebone Riggins-Bogue. Lactation Consultation Note  Patient Name: [name] [id number]'s Date: 09/30/2014 Reason for consult: Follow-up assessment  NICU baby 8 days of life, 1526w4d CGA. Assisted mom to latch baby to breast. Mom attempted to latch baby in cross-cradle position and baby nuzzled, licked, and attempted to latch several times. Mom stated that she wanted to hold baby in football position, so assisted to reposition baby. Baby fussy, but settled down and nuzzled at breast. FOB assisted mom by holding baby's hands. Discussed with mom that baby doing very well at breast. Mom enc to offer STS and attempts at breast. Enc mom to call for assistance as needed.  Maternal Data    Feeding Feeding Type: Breast Fed Length of feed: 60 min  LATCH Score/Interventions Latch: Too sleepy or reluctant, no latch achieved, no sucking elicited. Intervention(s): Skin to skin;Waking techniques  Audible Swallowing: None  Type of Nipple: Flat  Comfort (Breast/Nipple): Soft / non-tender     Hold (Positioning): Assistance needed to correctly position infant at breast and maintain latch. Intervention(s): Breastfeeding basics reviewed;Support Pillows;Position options;Skin to skin  LATCH Score: 4  Lactation Tools Discussed/Used     Consult Status Consult Status: PRN    Geralynn OchsWILLIARD, Reznor Ferrando 09/30/2014, 5:50 PM

## 2014-10-02 ENCOUNTER — Encounter: Payer: Self-pay | Admitting: Neurology

## 2014-10-02 ENCOUNTER — Ambulatory Visit: Payer: Self-pay

## 2014-10-02 ENCOUNTER — Ambulatory Visit (INDEPENDENT_AMBULATORY_CARE_PROVIDER_SITE_OTHER): Payer: 59 | Admitting: Neurology

## 2014-10-02 VITALS — BP 110/70 | HR 78 | Wt 225.6 lb

## 2014-10-02 DIAGNOSIS — G5603 Carpal tunnel syndrome, bilateral upper limbs: Secondary | ICD-10-CM

## 2014-10-02 DIAGNOSIS — G40B01 Juvenile myoclonic epilepsy, not intractable, with status epilepticus: Secondary | ICD-10-CM

## 2014-10-02 DIAGNOSIS — G5602 Carpal tunnel syndrome, left upper limb: Secondary | ICD-10-CM | POA: Diagnosis not present

## 2014-10-02 DIAGNOSIS — O1002 Pre-existing essential hypertension complicating childbirth: Secondary | ICD-10-CM | POA: Diagnosis not present

## 2014-10-02 DIAGNOSIS — G5601 Carpal tunnel syndrome, right upper limb: Secondary | ICD-10-CM | POA: Diagnosis not present

## 2014-10-02 MED ORDER — TOPIRAMATE 100 MG PO TABS: 100.0000 mg | ORAL_TABLET | Freq: Two times a day (BID) | ORAL | Status: DC

## 2014-10-02 NOTE — Patient Instructions (Signed)
1. Increase Topamax as follows: take 100 mg in the morning, 50 mg at night for one week. Increase to to 100 mg in the morning and 100 mg at night. New prescription has been sent to your pharmacy.

## 2014-10-02 NOTE — Progress Notes (Signed)
she is a 29 y.o. year old female with a history of JME and headaches who I first saw in January of 2013. She developed seizures at the age of 29. She has been treated with Depakote(alopecia and weight gain), Lamictal(150/200 - ?ineffective), Keppra(agitation).  She had been off meds for several years (4) because of cost and lack of insurance.  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 Modesto CharonWong.  She was placed on Topamax, the patient states primarily because of headache.  She states that the Topamax has completely eliminated any myoclonic jerks and it has definitely helped headache.  She had some weight loss with the Topamax and enjoyed to that.  She states that even if she is tired now (which always precipitate her events in the past), she does not have the myoclonic seizures.  She attributes all of that to the Topamax.  She estimates that she has not had a myoclonic jerk in about a year.   She has a history of what sounds like migraine headaches, photophobia with throbbing pain, but much less intense. They usually are relieved by ibuprofen and Imitrex.  She has had a headache the last 3 days, but prior to that headaches were well controlled with Topamax.  She presents today because she is planning on getting pregnant.  She is getting married on 08/10/2012 and states that she may even try to get pregnant before the wedding.  She is, however, still on oral contraceptive medication.  She requests discontinuation of the Topamax.  Routine EEG did reveal poorly form generalized spike and wave discharges.  03/18/13 update:  The pt presents today for f/u.  She has JME.  Despite my recommendation otherwise, the pt wanted off of Topamax for pregnancy.  She has been off of it for 10 months.  She wants to go back on the medication as she has not gotten pregnant.  She has started on spironolactone for alopecia and Glucophage for polycystic ovarian disease, and was told that she could not get  pregnant while on these medications. She has lost weight.  She is now on adipex for weight loss.  The only seizure that she had was last Thursday.  She states that she quickly got out of the bed because she was cold and wanted to take a shower and had several recurrent episodes of arm flinging in the shower, but then she went back to bed and did fine after she woke back up.  Otherwise, she thinks that she has been seizure-free for virtually all 10 months.  She has only been on the Adipex for about 8 weeks.  She has gotten married since our last visit, and her husband accompanied her today and supplement the history.  10/02/14 update:  The patient is following up today.  Her husband accompanies her and supplements the history.  Records reviewed since last visit, although I have not seen her since November, 2014.  At that point in time, she was placed back on Topamax, 100 mg twice a day, for her JME, which manifests as myoclonus.  At that time, I recommended that she stay on it even through pregnancy.  She ended up going off of it for pregnancy and delivered a baby boy on 09/22/2014.  She delivered at 34 weeks after being in the hospital for an incompetant cervix for several months.  The baby is still in the NICU for jaundice and feeding difficulties.  Almost immediately after (3 days after  delivery) she began to have her characteristic myoclonus that was very frequent.  She states that it was constant even in sleep, which she was trying to do, as she knew that sleep usually helps.  She states that she couldn't even hold the breast pump. She called here requesting a same day appointment, which was not available, and ended up lying to the emergency room.  She saw neurology and they recommended that she restart her Topamax at 50 mg twice a day.  She states after she started it for 2 days the events greatly decreased.  Baby is currently 1/2 pump fed (breast milk) and 1/2 formula supplemented.  She also states that about  3 weeks before delivery she started to have HTN.  She is on labetolol now and it is making her sleepy.  She has only been on it for 2 days, however.  States that she didn't have eclampsia/pre-eclampsia.  She did note that throughout the end of pregnancy she began to have paresthesias in both hands.   PREVIOUS MEDICATIONS: She has been treated with Depakote(alopecia and weight gain), Lamictal(150/200 - ?ineffective), Keppra(agitation).   ALLERGIES:   Allergies  Allergen Reactions  . Codeine Itching    CURRENT MEDICATIONS:  Current Outpatient Prescriptions on File Prior to Visit  Medication Sig Dispense Refill  . acetaminophen (TYLENOL) 500 MG tablet Take 1,000 mg by mouth every 4 (four) hours as needed for mild pain or moderate pain.    . folic acid (FOLVITE) 400 MCG tablet Take 800 mcg by mouth at bedtime.     Marland Kitchen HYDROcodone-acetaminophen (NORCO/VICODIN) 5-325 MG per tablet Take 1 tablet by mouth every 4 (four) hours as needed for moderate pain. 12 tablet 0  . ibuprofen (ADVIL,MOTRIN) 600 MG tablet Take 1 tablet (600 mg total) by mouth every 6 (six) hours. 30 tablet 0  . loratadine (CLARITIN) 10 MG tablet Take 10 mg by mouth daily as needed for allergies.    . metFORMIN (GLUCOPHAGE XR) 750 MG 24 hr tablet Take 1 tablet (750 mg total) by mouth daily with breakfast. 30 tablet 3  . pantoprazole (PROTONIX) 20 MG tablet Take 1 tablet (20 mg total) by mouth daily. 30 tablet 3  . Prenat w/o A-FE-DSS-Methfol-FA (PRENATAL MULTIVITAMIN) 90-600-400 MG-MCG-MCG tablet Take 1 tablet by mouth at bedtime.     . topiramate (TOPAMAX) 50 MG tablet Take 1 tablet (50 mg total) by mouth 2 (two) times daily. 60 tablet 0   No current facility-administered medications on file prior to visit.    PAST MEDICAL HISTORY:   Past Medical History  Diagnosis Date  . Hypertension   . Epilepsy     Last seizure 3 weeks ago  . Polycystic disease, ovaries   . Migraines   . Anxiety   . PCOS (polycystic ovarian syndrome)      PAST SURGICAL HISTORY:   Past Surgical History  Procedure Laterality Date  . Tonsillectomy    . Cervical cerclage N/A 06/26/2014    Procedure: CERCLAGE CERVICAL;  Surgeon: Lenoard Aden, MD;  Location: [facility];  Service: Gynecology;  Laterality: N/A;  . Wisdom tooth extraction      SOCIAL HISTORY:   History   Social History  . Marital Status: Married    Spouse Name: N/A  . Number of Children: N/A  . Years of Education: N/A   Occupational History  . Not on file.   Social History Main Topics  . Smoking status: Never Smoker   . Smokeless tobacco: Never Used  .  Alcohol Use: No  . Drug Use: No  . Sexual Activity:    Partners: Male    Birth Control/ Protection: Pill   Other Topics Concern  . Not on file   Social History Narrative   Married: no kids   Regular exercise: not at this time   Caffeine use: no    FAMILY HISTORY:  noncontributory  ROS:  A complete 10 system review of systems was obtained and was unremarkable apart from what is mentioned above.  PHYSICAL EXAMINATION:    VITALS:   Filed Vitals:   10/02/14 0956  BP: 110/70  Pulse: 78  Weight: 225 lb 9 oz (102.314 kg)  SpO2: 98%   Wt Readings from Last 3 Encounters:  10/02/14 225 lb 9 oz (102.314 kg)  09/26/14 234 lb 8 oz (106.369 kg)  09/17/14 239 lb (108.41 kg)    GEN:  Normal appears female in no acute distress.  Appears stated age. HEENT:  Normocephalic, atraumatic. The mucous membranes are moist. The superficial temporal arteries are without ropiness or tenderness. Cardiovascular: Regular rate and rhythm. Lungs: Clear to auscultation bilaterally. Neck/Heme: There are no carotid bruits noted bilaterally.  NEUROLOGICAL: Orientation:  The patient is alert and oriented x 3.  Fund of knowledge is appropriate.  Recent and remote memory intact.  Attention span and concentration normal.  Repeats and names without difficulty. Cranial nerves: There is good facial symmetry. The pupils are equal round  and reactive to light bilaterally. Fundoscopic exam reveals clear disc margins bilaterally. Extraocular muscles are intact and visual fields are full to confrontational testing. Speech is fluent and clear. Soft palate rises symmetrically and there is no tongue deviation. Hearing is intact to conversational tone. Tone: Tone is good throughout. Sensation: Sensation is intact to light touch throughout. Coordination:  The patient has no difficulty with RAM's or FNF bilaterally. Motor: Strength is 5/5 in the bilateral upper and lower extremities.  Shoulder shrug is equal and symmetric. There is no pronator drift.  There are no fasciculations noted. DTR's: Deep tendon reflexes are 3/4 at the bilateral biceps, triceps, brachioradialis, patella and achilles.  Plantar responses are downgoing bilaterally. Gait and Station: The patient is able to ambulate without difficulty.   LABS:    Chemistry      Component Value Date/Time   NA 140 09/26/2014 1234      Component Value Date/Time   CALCIUM 9.0 09/26/2014 1234     Lab Results  Component Value Date   TSH 0.40 03/29/2013   Lab Results  Component Value Date   WBC 9.3 09/26/2014   HGB 12.4 09/26/2014   HCT 36.1 09/26/2014   MCV 90.9 09/26/2014   PLT 231 09/26/2014       Impression/Recommendations:  1.  JME manifesting as myoclonic seizures   -We are going to slowly increase the patient's Topamax, so that she ultimately goes back up to the 100 mg twice a day dosing that she was previously on.  I told her that I do not recommend d/c the medication, even when future pregnancy planning.  -I had a long discussion with the patient today regarding Topamax.  We talked about the fact that she is currently breast-feeding/pumping and potential effects on the baby.  There is very limited data on this.  We do know that Topamax passes into the breastmilk.  We talked about the fact that she should watch her baby for inadequate weight gain or weight loss,  drowsiness, diarrhea, etc.  -seizure and safety was  discussed, including [place] driving laws.  However, she has no loss or alteration of consciousness at all with her seizures, and [place] does make exceptions for these type of seizures.  Her seizures consist only of myoclonic activity in the hands.  I asked her to stop driving for 6 weeks and as long as she is in event free, she can slowly resume driving. 2.  Hand paresthesias  -Likely a result of carpal tunnel syndrome during pregnancy.  I told her that as blood volume returns to normal, this should get better.   3.    Hypertension  -This started at the end of pregnancy, but patient states that she did not have preeclampsia/eclampsia.  She was started on labetalol a few days ago and it is making her very tired.  I told her to give it a week or 2 for her to get used to it.  If not better, she should follow up with the prescribing physician. 4.  she will followup with me in in 6 months, sooner should new neurologic issues arise.  Greater than 50% of the 30 minute visit spent in counseling, as above.

## 2014-10-02 NOTE — Lactation Note (Signed)
This note was copied from the chart of Brooke Lacrecia [name]. Lactation Consultation Note  Patient Name: Brooke Chyrel MassonCharity [name] CWCBJ'SToday's Date: 10/02/2014 Reason for consult: Follow-up assessment;NICU baby  NICU baby 10 days, 5334w3d CGA. Mom reports that her supply is increasing each time that she pumps. Enc mom to offer STS and attempts at breast as able, and to [facility] needed. Maternal Data    Feeding Feeding Type: Breast Milk with Formula added Length of feed: 60 min  LATCH Score/Interventions                      Lactation Tools Discussed/Used     Consult Status Consult Status: PRN    Geralynn OchsWILLIARD, Alixandria Friedt 10/02/2014, 10:58 AM

## 2014-10-27 ENCOUNTER — Telehealth: Payer: Self-pay | Admitting: Neurology

## 2014-10-27 NOTE — Telephone Encounter (Signed)
Please review

## 2014-10-27 NOTE — Telephone Encounter (Signed)
Pt had her baby and now is staying home by herself. She states that she is having more seizures since the baby was born. She states that she needs help and wanted to talk to someone about a night nurse. Please call her at [phone]

## 2014-10-27 NOTE — Telephone Encounter (Signed)
Tiff, I think the [employer] may provide some suggestions/resources, pls have her contact them first, because I don't think visiting nurses take care of baby (I assume nurse is for baby and not patient)?

## 2014-10-27 NOTE — Telephone Encounter (Signed)
Agree with Dr. Karel Jarvis.  Please let pt know

## 2014-10-27 NOTE — Telephone Encounter (Signed)
See message below, please process on Dr. Charlyn Minerva behalf / Oneita Kras.

## 2014-10-28 ENCOUNTER — Other Ambulatory Visit: Payer: Self-pay | Admitting: *Deleted

## 2014-10-28 MED ORDER — TOPIRAMATE 50 MG PO TABS: ORAL_TABLET | ORAL | Status: DC

## 2014-10-28 NOTE — Telephone Encounter (Signed)
Unable to reach patient [facility] for patient.

## 2014-10-28 NOTE — Telephone Encounter (Signed)
Spoke with patient she has agreed to come in tomorrow for an EGG. Rx sent to the pharmacy

## 2014-10-28 NOTE — Telephone Encounter (Signed)
Patient was notified.

## 2014-10-28 NOTE — Telephone Encounter (Signed)
She has had more since our last visit when I increased her topamax?  If so, increase topamax to 150 mg bid (call in new RX) and have her come in tomorrow for EEG at 1

## 2014-10-28 NOTE — Telephone Encounter (Signed)
Patient called this morning she would like to come in and be seen she has had several seizures since the baby was born and would like to come in today please advise

## 2014-10-28 NOTE — Telephone Encounter (Signed)
Per increase in medication

## 2014-10-29 ENCOUNTER — Other Ambulatory Visit: Payer: Self-pay | Admitting: *Deleted

## 2014-10-29 ENCOUNTER — Telehealth: Payer: Self-pay | Admitting: Neurology

## 2014-10-29 ENCOUNTER — Ambulatory Visit (INDEPENDENT_AMBULATORY_CARE_PROVIDER_SITE_OTHER): Payer: 59 | Admitting: Neurology

## 2014-10-29 DIAGNOSIS — R569 Unspecified convulsions: Secondary | ICD-10-CM

## 2014-10-29 NOTE — Procedures (Signed)
ELECTROENCEPHALOGRAM REPORT  Date of Study: 10/29/2014  Patient's Name: Brooke [name] MRN: [id number] Date of Birth: 03-Aug-1985  Referring Provider: Dr. Lurena Joiner Tat  Clinical History: This is a 29 year old woman with a history of Juvenile Myoclonic Epilepsy reporting an increase in seizure frequency.  Medications: Topamax  Technical Summary: A multichannel digital EEG recording measured by the international 10-20 system with electrodes applied with paste and impedances below 5000 ohms performed in our laboratory with EKG monitoring in an awake and asleep patient.  Hyperventilation and photic stimulation were performed.  The digital EEG was referentially recorded, reformatted, and digitally filtered in a variety of bipolar and referential montages for optimal display.    Description: The patient is awake and asleep during the recording.  During maximal wakefulness, there is a symmetric, medium voltage 10.5-11 Hz posterior dominant rhythm that attenuates with eye opening.  The record is symmetric.  During drowsiness and sleep, there is an increase in theta slowing of the background.  Vertex waves and symmetric sleep spindles were seen. There were frequent 1-3 second bursts of generalized irregular 3-4 Hz spike and polyspike and wave discharges with frontal predominance seen throughout the recording. Similar brief bursts were seen during hyperventilation, photic stimulation, and sleep with no clinical changes noted. There were no electrographic seizures seen.  No myoclonic jerks reported or seen.  EKG lead was unremarkable.  Impression: This awake and asleep EEG is abnormal due to frequent 1-3 second bursts of generalized irregular 3-4 Hz spike and polyspike and wave discharges with frontal predominance.  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 There were no clinical changes reported with the generalized discharges, no  myoclonic jerks captured.    Patrcia Dolly, M.D.

## 2014-10-29 NOTE — Telephone Encounter (Signed)
Let pt know that her EEG was abnormal and please make sure she went up on her topamax.  How is she doing clinically (any seizures over last few days?)

## 2014-10-30 ENCOUNTER — Other Ambulatory Visit: Payer: 59

## 2014-10-31 NOTE — Telephone Encounter (Signed)
Patient notified she would still like to be considered for a night nurse

## 2014-10-31 NOTE — Telephone Encounter (Signed)
Pt called and said she had a call from someone this morning, she has bad cell service and did not understand the reason for the call, she did have an EEG on 10/30/2014/Dawn CB# [phone]

## 2014-10-31 NOTE — Telephone Encounter (Signed)
Is she taking 150 mg bid?  She has only been on that dose a few days i think so little leary but may need to further increase.  If still having events, then do 150 mg in AM and 200 at night.  Make sure that she is trying to get sleep

## 2014-10-31 NOTE — Telephone Encounter (Signed)
Spoke with patient she has increased on her Topamax but is still have seizures at night

## 2014-11-03 NOTE — Telephone Encounter (Signed)
I have no control over that unfortunately.   Insurance is not going to pay for a night nurse because she has a diagnosis of epilepsy with a new baby.  She would have to pay out of pocket for that service.  If she would like to do that, then happy to send referral for her

## 2014-11-03 NOTE — Telephone Encounter (Signed)
Left message for patient that she could pay out of pocket and if that is what she decides, she can call us back for a referral.

## 2014-11-04 ENCOUNTER — Telehealth: Payer: Self-pay | Admitting: *Deleted

## 2014-11-04 NOTE — Telephone Encounter (Signed)
Last office note/ EEG faxed to Dr Modesto CharonWong per patient request.

## 2014-11-04 NOTE — Telephone Encounter (Signed)
Please fax office notes and imaging to Dr. Modesto CharonWong Fax number [phone]

## 2015-03-19 ENCOUNTER — Ambulatory Visit (INDEPENDENT_AMBULATORY_CARE_PROVIDER_SITE_OTHER): Payer: 59 | Admitting: Licensed Clinical Social Worker

## 2015-03-19 DIAGNOSIS — F321 Major depressive disorder, single episode, moderate: Secondary | ICD-10-CM

## 2015-03-30 ENCOUNTER — Telehealth: Payer: Self-pay | Admitting: Neurology

## 2015-03-30 NOTE — Telephone Encounter (Signed)
Brooke [name], pt has [facility]  He has been faithfully seeing her and saw her already in Nov and has plans for f/u in 6 weeks.  She can be removed from my schedule as I doubt that she wants to be seen here any longer.  She certainly doesn't need both of us

## 2015-03-30 NOTE — Telephone Encounter (Signed)
Appt cancelled

## 2015-04-06 ENCOUNTER — Ambulatory Visit (INDEPENDENT_AMBULATORY_CARE_PROVIDER_SITE_OTHER): Payer: 59 | Admitting: Licensed Clinical Social Worker

## 2015-04-06 ENCOUNTER — Ambulatory Visit: Payer: 59 | Admitting: Neurology

## 2015-04-06 DIAGNOSIS — F321 Major depressive disorder, single episode, moderate: Secondary | ICD-10-CM | POA: Diagnosis not present

## 2015-04-19 ENCOUNTER — Encounter (HOSPITAL_COMMUNITY): Payer: Self-pay | Admitting: *Deleted

## 2015-04-19 ENCOUNTER — Inpatient Hospital Stay (HOSPITAL_COMMUNITY)
Admission: AD | Admit: 2015-04-19 | Discharge: 2015-04-19 | Disposition: A | Discharge status: Home / Self Care | Payer: 59 | Source: Ambulatory Visit | Attending: Obstetrics and Gynecology | Admitting: Obstetrics and Gynecology

## 2015-04-19 DIAGNOSIS — R42 Dizziness and giddiness: Secondary | ICD-10-CM

## 2015-04-19 DIAGNOSIS — R030 Elevated blood-pressure reading, without diagnosis of hypertension: Secondary | ICD-10-CM | POA: Insufficient documentation

## 2015-04-19 LAB — URINALYSIS, ROUTINE W REFLEX MICROSCOPIC
Bilirubin Urine: NEGATIVE
Glucose, UA: NEGATIVE mg/dL
Hgb urine dipstick: NEGATIVE
Ketones, ur: NEGATIVE mg/dL
Leukocytes, UA: NEGATIVE
Nitrite: NEGATIVE
Protein, ur: NEGATIVE mg/dL
Specific Gravity, Urine: >1.03 — ABNORMAL HIGH (ref 1.005–1.030)
pH: 6 (ref 5.0–8.0)

## 2015-04-19 NOTE — MAU Note (Signed)
Urine sent to lab 

## 2015-04-19 NOTE — MAU Note (Signed)
Pt reports she has been feeling dizzy for the last few days and was here visiting and had someone take her B/P and it was elevated. Has not had a period since September and had a negative preg test in the doctor's office.

## 2015-04-19 NOTE — MAU Provider Note (Signed)
History     CSN: [id number]  Arrival date and time: 04/19/15 1853  Call to provider from nurse - [phone] Provider here to see patient@ 2035    Chief Complaint  Patient presents with  . Dizziness  . Hypertension   HPI  Here visiting nurses on antenatal & told nurses that she has been dizzy and lightheaded for several days and asked for BP check Nurses checked BP and recommended that she be evaluated - sent to MAU  Past Medical History  Diagnosis Date  . Hypertension   . Epilepsy (HCC)     Last seizure 3 weeks ago  . Polycystic disease, ovaries   . Migraines   . Anxiety   . PCOS (polycystic ovarian syndrome)     Past Surgical History  Procedure Laterality Date  . Tonsillectomy    . Cervical cerclage N/A 06/26/2014    Procedure: CERCLAGE CERVICAL;  Surgeon: Lenoard Adenichard J Taavon, MD;  Location: [facility];  Service: Gynecology;  Laterality: N/A;  . Wisdom tooth extraction      Family History  Problem Relation Age of Onset  . Diabetes Mother   . Hypertension Mother   . Hypertension Father   . Alcohol abuse Maternal Grandmother   . Hearing loss Paternal Grandfather     Social History  Substance Use Topics  . Smoking status: Never Smoker   . Smokeless tobacco: Never Used  . Alcohol Use: No    Allergies: No Known Allergies  Prescriptions prior to admission  Medication Sig Dispense Refill Last Dose  . citalopram (CELEXA) 10 MG tablet Take 20 mg by mouth daily.    04/19/2015 at Unknown time  . clonazePAM (KLONOPIN) 0.25 MG disintegrating tablet Take 0.25 mg by mouth daily.    04/19/2015 at Unknown time  . folic acid (FOLVITE) 400 MCG tablet Take 800 mcg by mouth at bedtime.    04/18/2015 at Unknown time  . ibuprofen (ADVIL,MOTRIN) 200 MG tablet Take 800 mg by mouth every 6 (six) hours as needed for headache.   Past Month at Unknown time  . loratadine (CLARITIN) 10 MG tablet Take 10 mg by mouth daily as needed for allergies.   two weeks  . metFORMIN (GLUCOPHAGE XR) 750 MG 24  hr tablet Take 1 tablet (750 mg total) by mouth daily with breakfast. 30 tablet 3 04/19/2015 at Unknown time  . topiramate (TOPAMAX) 100 MG tablet Take 1 tablet (100 mg total) by mouth 2 (two) times daily. 60 tablet 5 04/19/2015 at Unknown time  . HYDROcodone-acetaminophen (NORCO/VICODIN) 5-325 MG per tablet Take 1 tablet by mouth every 4 (four) hours as needed for moderate pain. (Patient not taking: Reported on 04/19/2015) 12 tablet 0 Completed Course at Unknown time  . ibuprofen (ADVIL,MOTRIN) 600 MG tablet Take 1 tablet (600 mg total) by mouth every 6 (six) hours. (Patient not taking: Reported on 04/19/2015) 30 tablet 0 Not Taking at Unknown time  . norethindrone (AYGESTIN) 5 MG tablet Take 5 mg by mouth 2 (two) times daily. 10 day course  0 04/17/2015  . pantoprazole (PROTONIX) 20 MG tablet Take 1 tablet (20 mg total) by mouth daily. (Patient not taking: Reported on 04/19/2015) 30 tablet 3 Not Taking at Unknown time  . topiramate (TOPAMAX) 50 MG tablet Take three tablet ( total 150mg ) by mouth twice daily (Patient not taking: Reported on 04/19/2015) 180 tablet 1 Not Taking at Unknown time   ROS  Intermittent headaches but none at present - hx seizure with last seizure 3 weeks  ago Dizziness x 3 days - none at present time Ate about 6pm Reports elevated BP on antenatal floor Taking Aygestin for amenorrhea  Physical Exam   Blood pressure 139/77, pulse 89, temperature 98.9 F (37.2 C), temperature source Oral, resp. rate 18, height  (1.651 m), weight 104.781 kg (231 lb), last menstrual period 02/01/2015, SpO2 99 %, unknown if currently breastfeeding.  Physical Exam  Alert and oriented / NAD Heart RR with rate 90 Lungs - clear and unlabored Abdomen soft and non-tender  MAU Course  Procedures   Assessment and Plan  Elevated BP - labile BP / stable Dizziness with hx seizure disorder  Discussed should not be assessed by inpatient nursing staff when not a patient - should contact her  neurologist or PCP. States Dr Billy Coast is her PCP and takes care of all of her medications - feels like BP is up due to pills to start period (Aygestin only). Reviewed BP - stable and hx borderline BP in office in 140/80s range without current RX - unlikely pill causing any changes in BP and not associated with dizziness.   Reviewed dizziness is not common with elevated BP more likely to have headache and vision changes when BP elevated. Symptoms may be neuro related and questioned typical pre-seizure symptoms. States it is not seizure and "I do not need to to call my Neurologist". Additionally, could be related to low BS if not eating regularly with adequate protein ( PCOS on metformin) -" I don't get low blood sugars" .  Recommend evaluation at [facility] or [facility] for medical issues as [facility] evaluates for pregnancy and gynecology concerns.  Unable to evaluate her fully to determine cause for her symptoms. " I came here because I was already here & the nurses know me and take care of me here". Instructed to call prior to presentation to [facility] - we could have recommended going to [facility] or [facility]. Again - recommended not seeking medical/ nursing evaluation here with ante staff when not a patient.   Not interested in option of evaluation in ED - ok for apt tomorrow to be seen since BP stable and no current symptoms. Recommend referral to see PCP or Dr Modesto Charon (neuro at [facility]) for her symptoms. States she will just call someone as not going to call Dr Modesto Charon - "I will just get apt somewhere else". Again, recommended to contact Dr Modesto Charon or allow Dr Jorene Minors office to make a referral for her as unlikely to get new patient apt late in year and should have evaluation sooner than later. Shrugged shoulders without verbal response.  Left AMA prior to any further discussion, instructions, or recommendations.   Marlinda Mike 04/19/2015, 8:34 PM

## 2015-04-21 ENCOUNTER — Ambulatory Visit: Payer: 59 | Admitting: Licensed Clinical Social Worker

## 2015-07-15 ENCOUNTER — Ambulatory Visit (INDEPENDENT_AMBULATORY_CARE_PROVIDER_SITE_OTHER): Payer: 59 | Admitting: Podiatry

## 2015-07-15 ENCOUNTER — Ambulatory Visit (INDEPENDENT_AMBULATORY_CARE_PROVIDER_SITE_OTHER): Payer: 59

## 2015-07-15 ENCOUNTER — Encounter: Payer: Self-pay | Admitting: Podiatry

## 2015-07-15 DIAGNOSIS — M79671 Pain in right foot: Secondary | ICD-10-CM

## 2015-07-15 DIAGNOSIS — M8430XA Stress fracture, unspecified site, initial encounter for fracture: Secondary | ICD-10-CM

## 2015-07-15 DIAGNOSIS — M722 Plantar fascial fibromatosis: Secondary | ICD-10-CM

## 2015-07-15 NOTE — Patient Instructions (Signed)
Today your examination and x-ray revealed a bone stress reaction (bruise like bone on the outer portion of your right foot without any x-ray findings of fracture) Also, the outer bandage of the plantar fascia is tender, plantar fasciitis I recommended to wear lace up athletic style shoe Also, over prewrap use to image sports tape as we discussed to further support your midfoot If the pain does not improve over time return to the office for dispensing of a Cam Walker boot

## 2015-07-15 NOTE — Progress Notes (Signed)
   Subjective:    Patient ID: Brooke [name], female    DOB: 1985/08/09, 30 y.o.   MRN: [id number]  HPI N-PAINFUL   This patient presents today complaining of pain on the lateral and plantar aspect of the right foot for the past 3 months that is slowly increasing pain and discomfort with weightbearing. Symptoms are especially uncomfortable she changed from a seated to standing position and the FirstStep in the morning. Patient says that the pain began in the right foot when she wore a pair of fashion boots that were to tight. Patient said she continue to wear the fashion boot trying to stretch the boots out or him every other day until the pain in her right foot was unbearable and she discontinue wearing the boots. She says when she is at home wearing crocs the foot is less painful. She soaks her foot and ice water at times to reduce some of the symptoms.. 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 Patient states that her weight is increased more recently after pregnancy from 200 pounds to 235 pounds and she is approximately 5"5".      Review of Systems  Musculoskeletal: Positive for joint swelling.       Objective:   Physical Exam  orientated 3 Patient is a proximally 5 foot 5 inches and weighs approximately 235 pound  Vascular: DP and PT pulses 2/4 bilaterally Capillary reflex immediate bilaterally  Neurological: Sensation to 10 g monofilament wire intact 5/5 bilaterally Vibratory sensation reactive bilaterally Ankle reflex equal and reactive bilaterally  Dermatological: Texture and turgor within normal limits bilaterally No skin lesions noted bilaterally  Musculoskeletal: Patient has a C-shaped foot, bilaterally There is palpable tenderness over the lateral fifth metatarsal area without any palpable lesions, right foot There is palpable tenderness over the lateral fascial band on the  right foot without any palpable lesions, right foot Upon weight-bearing patient is limping favoring the right foot Ankle motion 90 with knee flexed extended There is no restriction or crepitus on range of motion of ankle, subtalar, midtarsal joints bilaterally   X-ray examination weightbearing right foot dated 07/15/2015  Intact bony structures without fracture and/or dislocation Metatarsus adductus Bone density and joint spaces are adequate  Radiographic impression: No acute bony abnormality noted in the right foot on x-ray dated 07/15/2015      Assessment & Plan:   Assessment: Bone stress reaction fifth right metatarsal Plantar fasciitis the lateral fascial band right foot  Plan: Today I reviewed the results of x-ray examination and physical examination. I recommended patient wear an athletic lace up style shoe at all times with a supportive dressing that I demonstrated the application of 2 inch tape over prewrap over the midfoot on the right foot daily. If the pain does not gradually reduce over time with supportive shoes and additional tape dressings I recommended she return to the office for dispensing of a Cam Walker boot  Reappoint at patient's request

## 2015-10-18 IMAGING — US US MFM OB TRANSVAGINAL
1 series · 13 of 28 positions shown · non-contrast
Comparison: none

[Series 1: us mfm ob transvaginal · 13 of 44 slices shown]
[im 2/44]
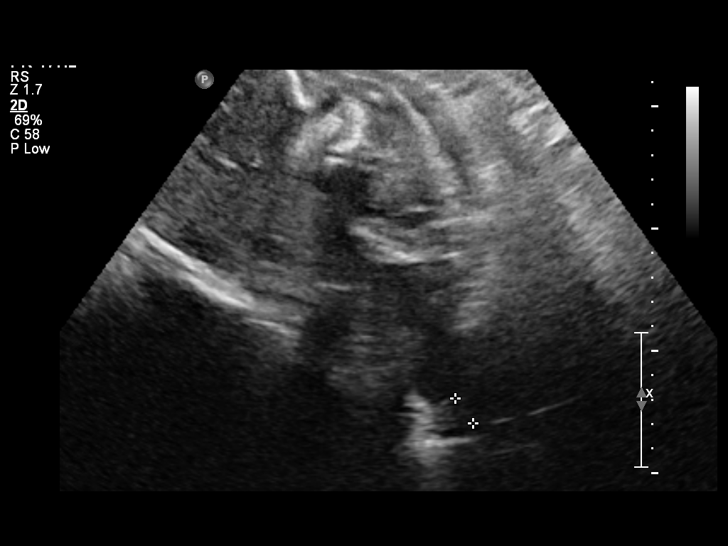
[im 5/44]
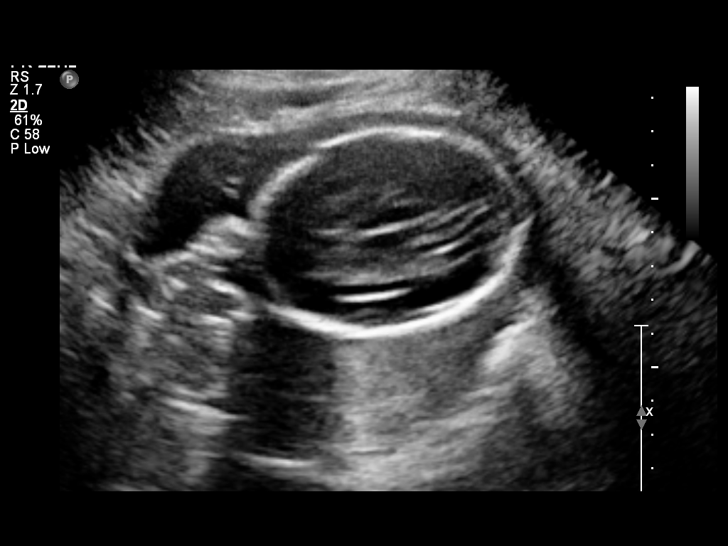
[im 8/44]
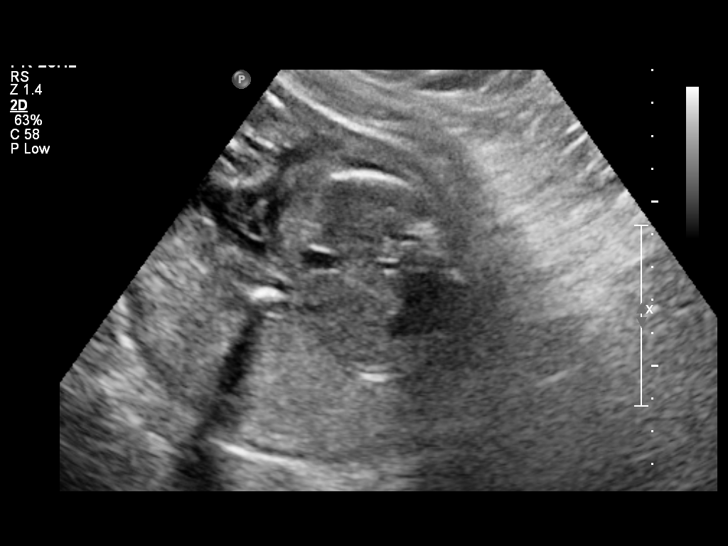
[im 12/44]
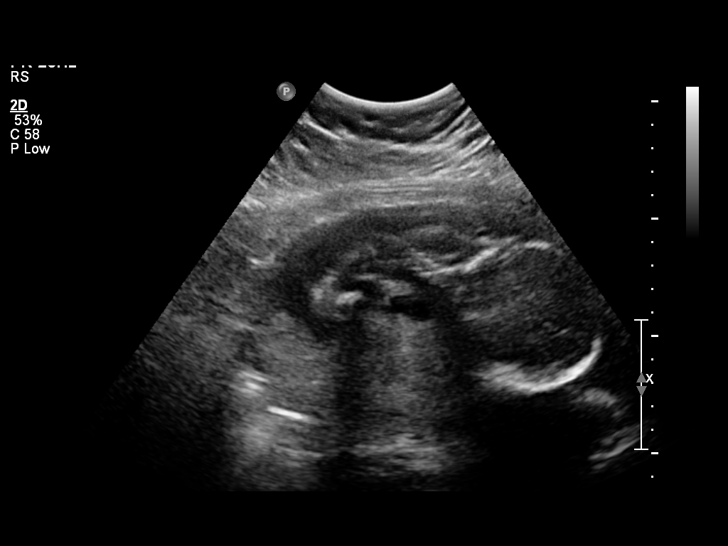
[im 15/44]
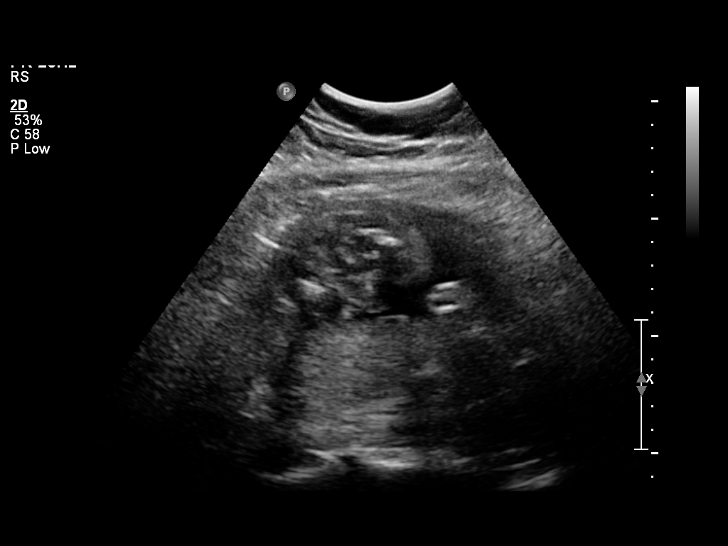
[im 18/44]
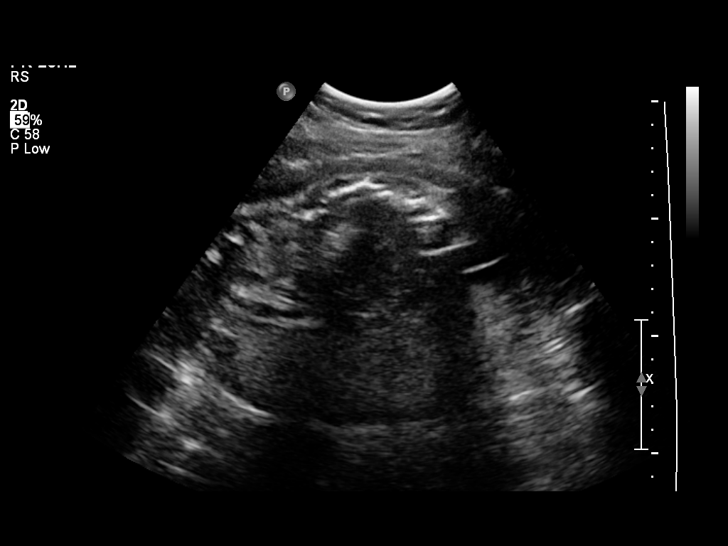
[im 23/44]
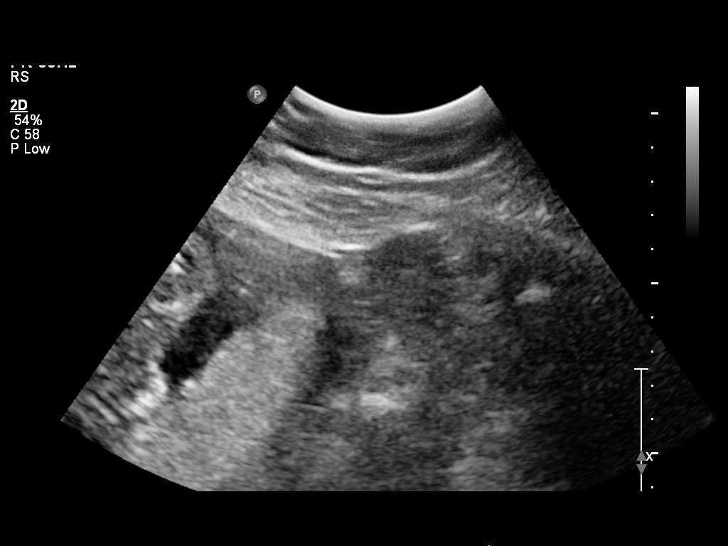
[im 26/44]
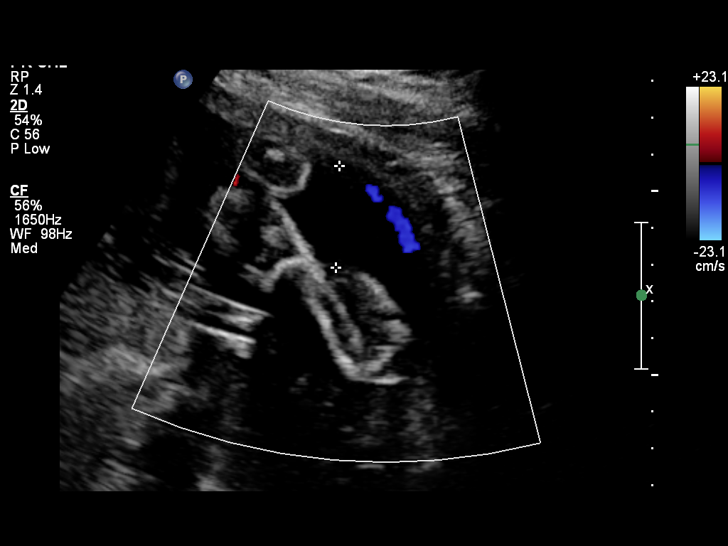
[im 29/44]
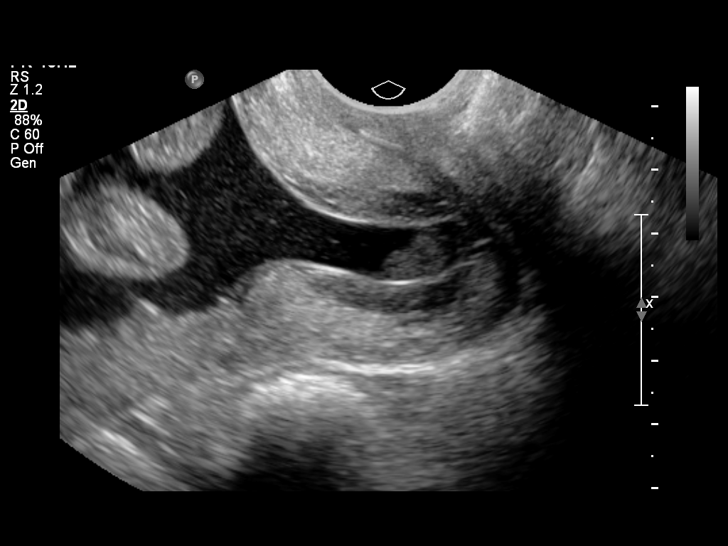
[im 32/44]
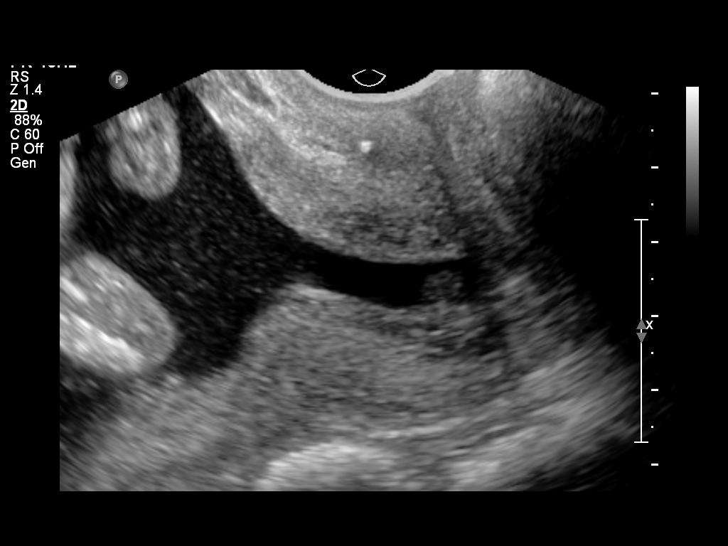
[im 36/44]
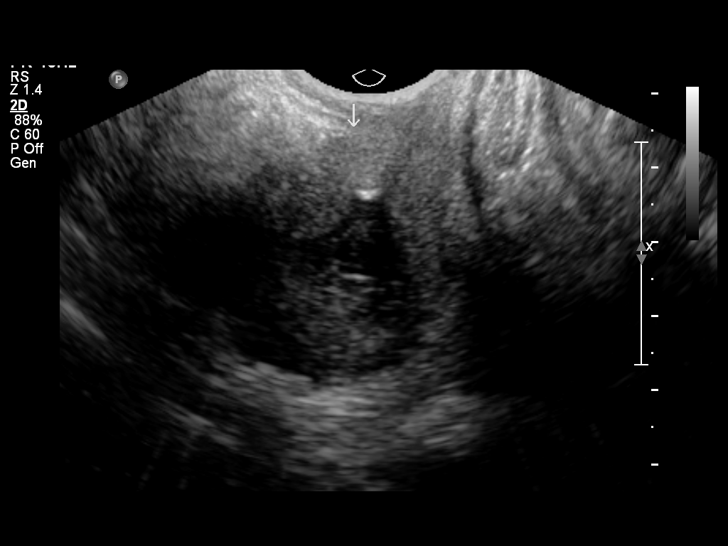
[im 39/44]
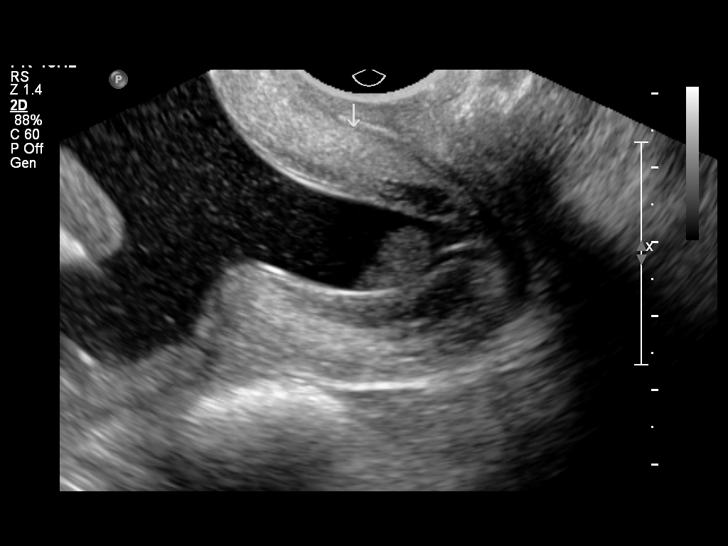
[im 42/44]
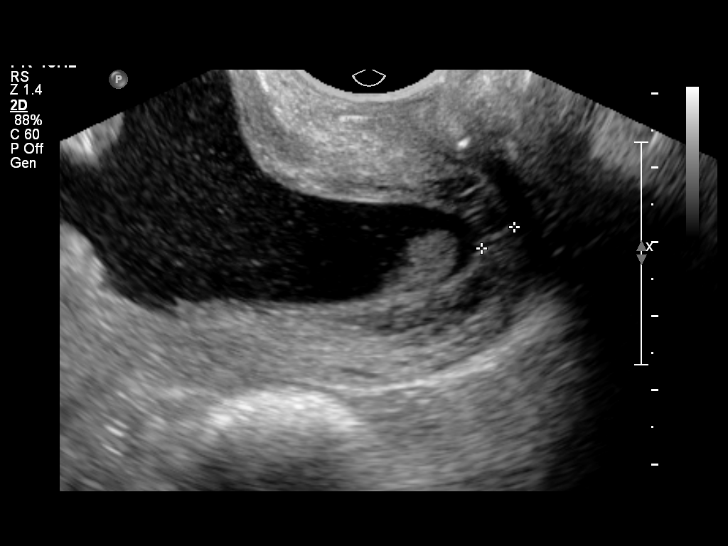

[13 of 28 positions shown; findings below may reference images not displayed]

OBSTETRICS REPORT
                      (Signed Final 07/10/2014 [DATE])

             KEATON

Service(s) Provided

 US MFM OB TRANSVAGINAL                                76817.2
Indications

 23 weeks gestation of pregnancy
 Pregnancy resulting from assisted reproductive
 technology
 Cervical shortening, 2nd; S/P cerclage [DATE]
 Premature rupture of membranes - leaking fluid
 Abdominal pain in pregnancy
Fetal Evaluation

 Num Of Fetuses:    1
 Fetal Heart Rate:  146                          bpm
 Cardiac Activity:  Observed
 Presentation:      Oblique
 Placenta:          Posterior, above cervical
                    os
 P. Cord            Previously Visualized
 Insertion:

 Amniotic Fluid
 AFI FV:      Subjectively low-normal
                                              Larg Pckt:   3.45  cm
 LUQ:   3.45    cm
Gestational Age

 LMP:           30w 0d        Date:  12/12/13                 EDD:    09/18/14
 Best:          23w 6d     Det. By:  Early Ultrasound         EDD:    10/31/14
Cervix Uterus Adnexa

 Cervical Length:    0.5       cm      Funnel Width:   1.3       cm
 Funnel Length:      2.93      cm

 Cervix:       Measured transvaginally. Funneling of internal os
               noted. Cerclage visualized.
 Uterus:       No abnormality visualized.
 Cul De Sac:   No free fluid seen.

 Left Ovary:    No adnexal mass visualized.
 Right Ovary:   No adnexal mass visualized.
 Adnexa:     No abnormality visualized.
Impression

 SIUP at 23+6 weeks
 Low normal amniotic fluid volume
 EV views of cervix: dynamic U-shaped funneling past the
 cerclage with the distal closed portion measuring 0 - 5 mms;
 with maximum funneling, extermal os appeared to be dilated

 5 mms; suture can only be visualized anteriorly
Recommendations

 Follow-up as clinically indicated

 BOUZOUMITA with us.  Please do not hesitate to contact

## 2015-11-05 IMAGING — US US MFM OB TRANSVAGINAL
1 series · 13 of 22 positions shown · non-contrast
Comparison: none

[Series 1: us mfm ob transvaginal · 0.23mm/px · 13 of 22 slices shown]
[im 1/22]
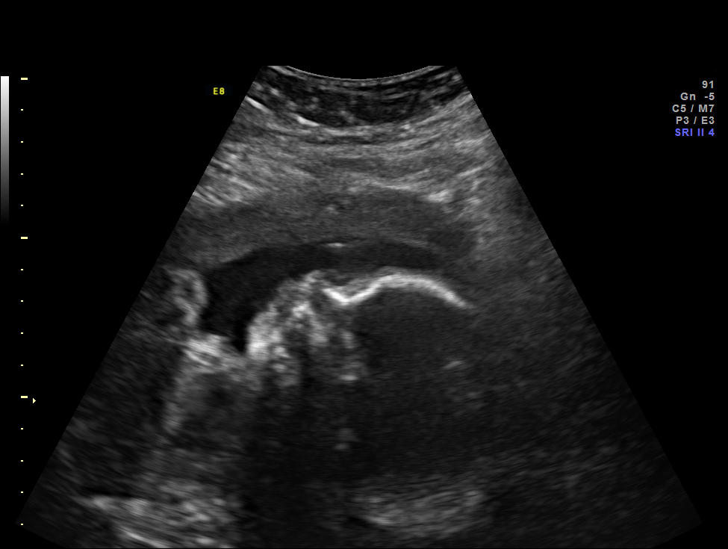
[im 3/22]
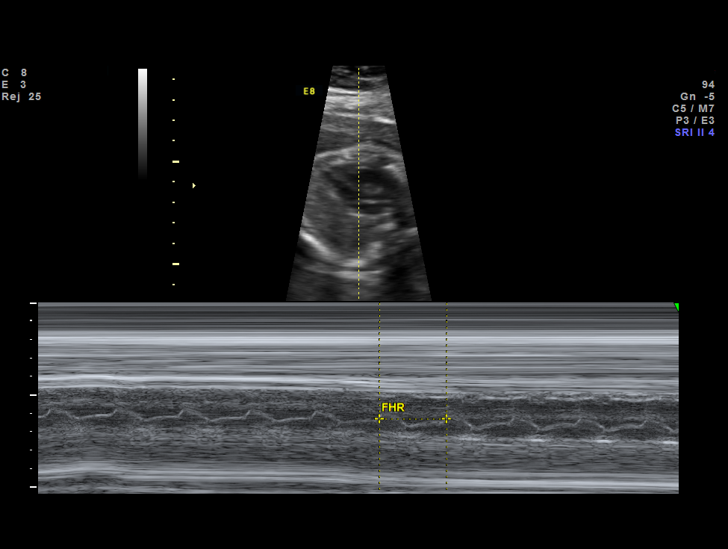
[im 5/22]
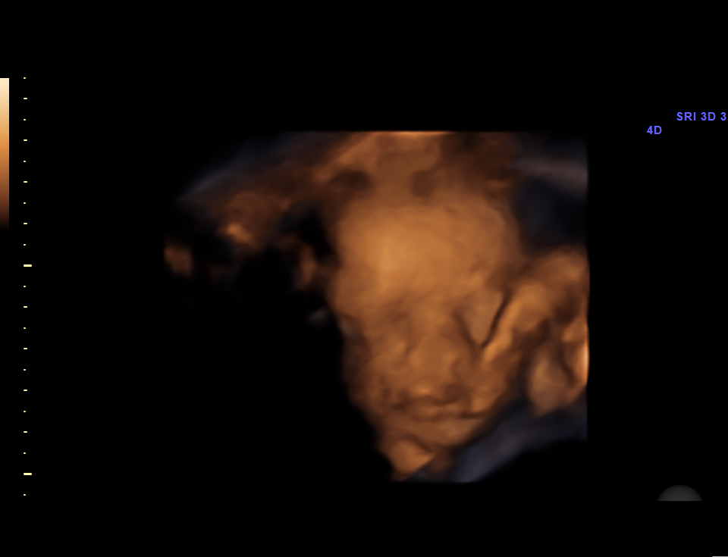
[im 6/22]
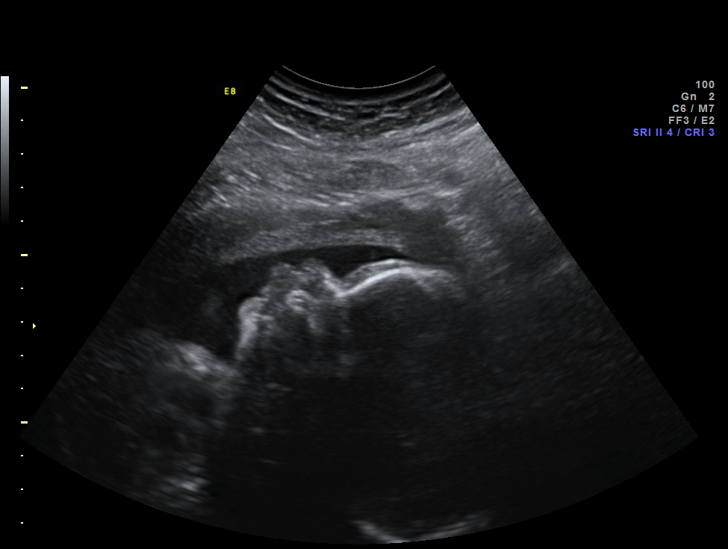
[im 8/22]
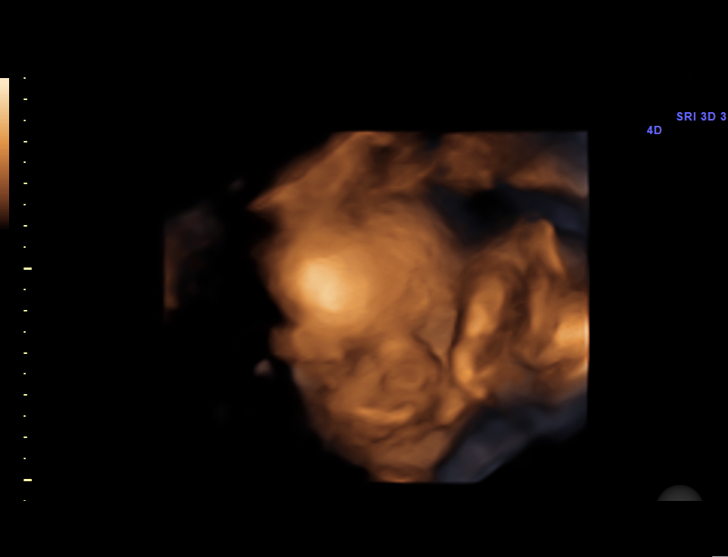
[im 10/22]
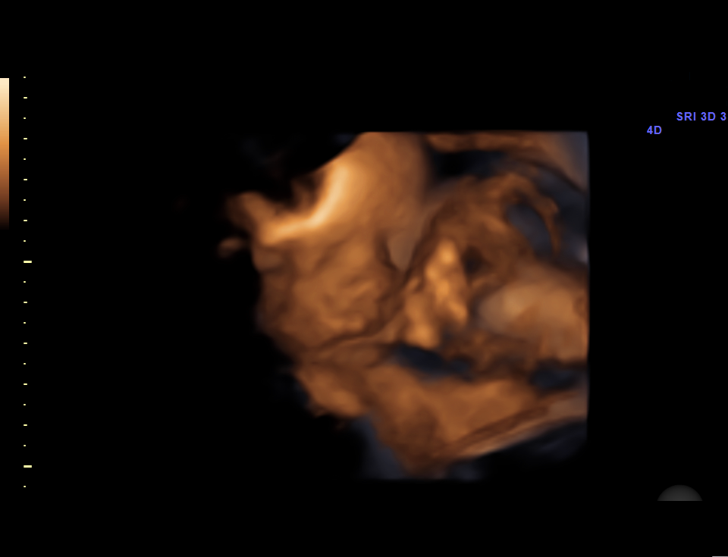
[im 12/22]
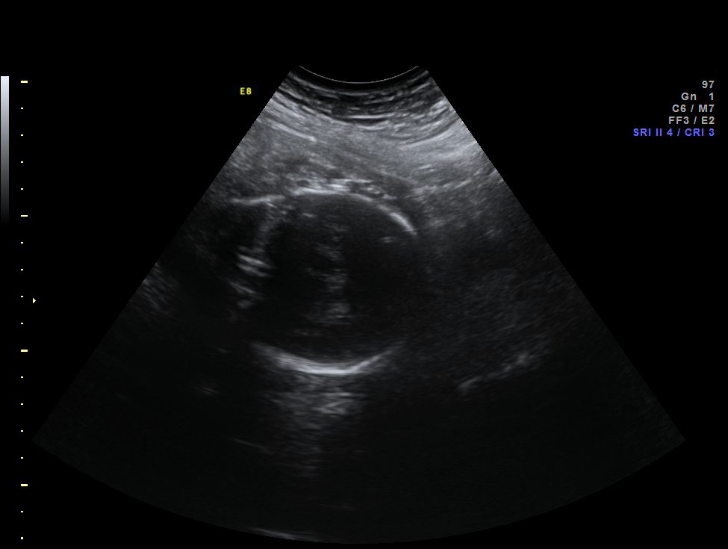
[im 13/22]
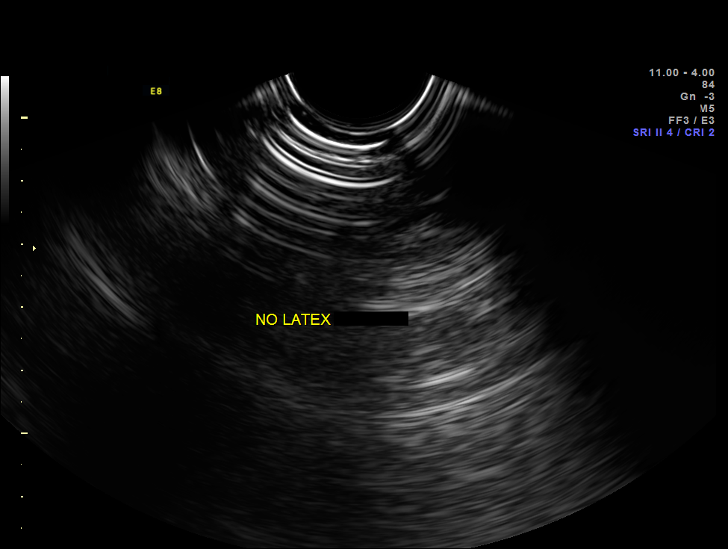
[im 15/22]
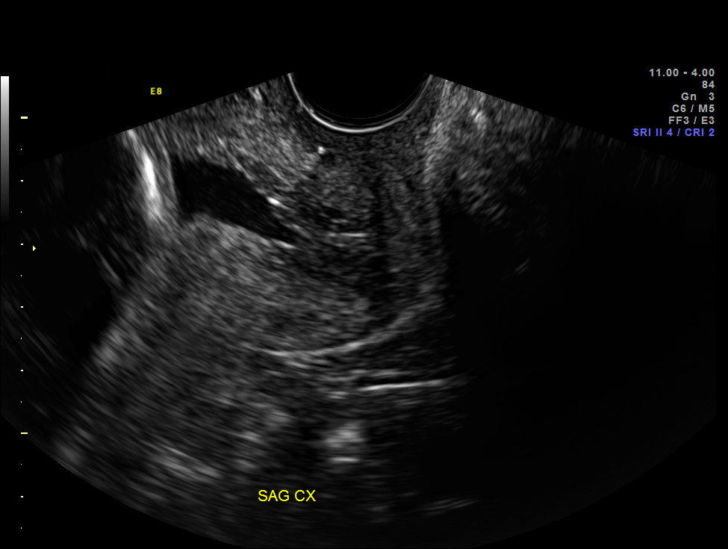
[im 17/22]
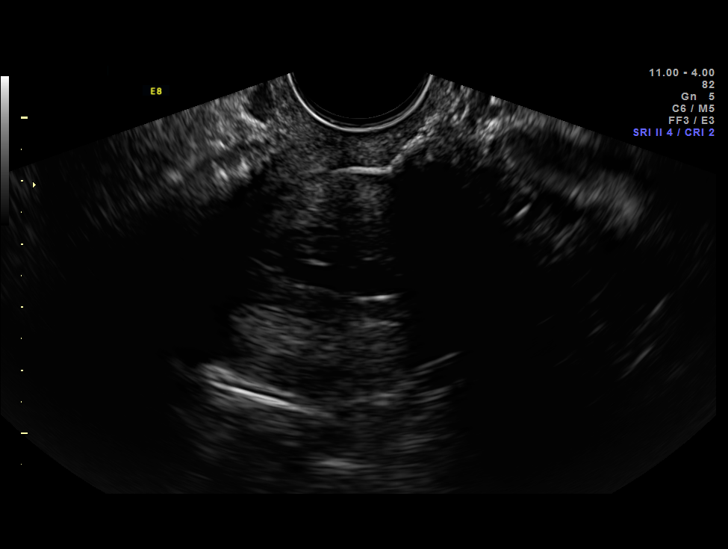
[im 18/22]
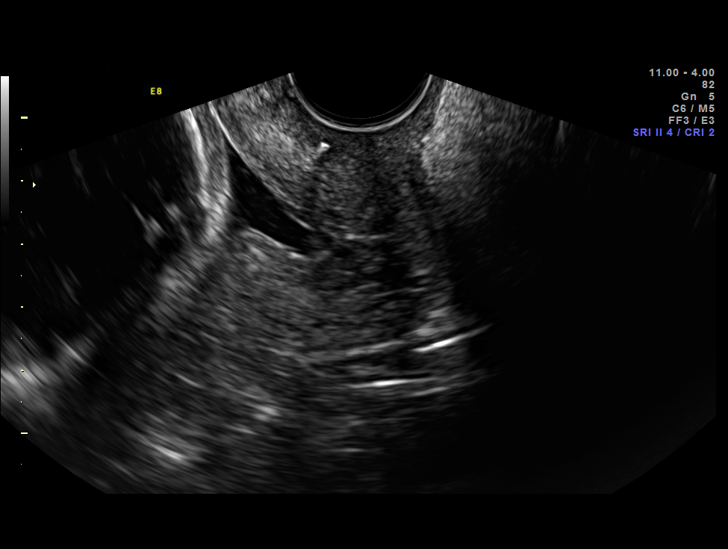
[im 20/22]
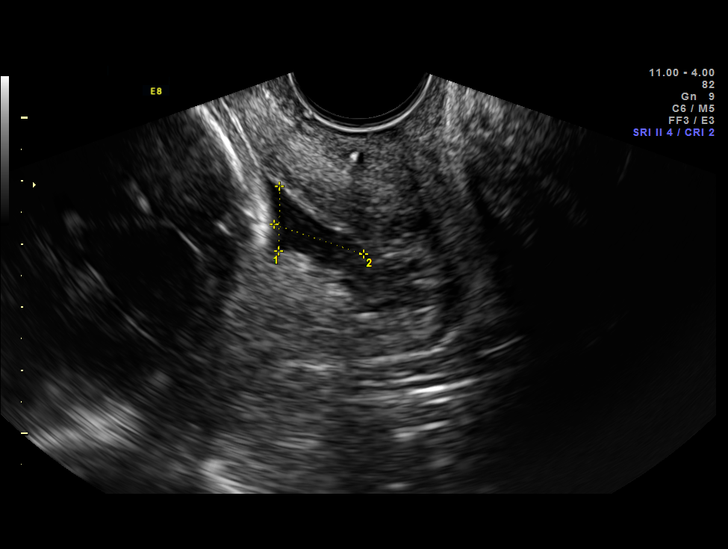
[im 22/22]
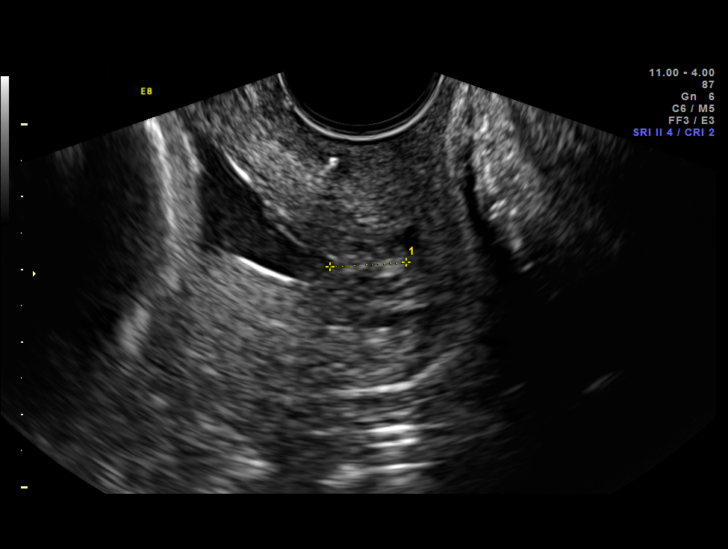

[13 of 22 positions shown; findings below may reference images not displayed]

OBSTETRICS REPORT
                      (Signed Final 07/28/2014 [DATE])

             YAMASAKI

Service(s) Provided

 US MFM OB TRANSVAGINAL                                76817.2
Indications

 Cervical insufficiency, 2nd (S/P cerclage 06/26/14)
 Pregnancy resulting from assisted reproductive
 technology
 26 weeks gestation of pregnancy
 Obesity complicating pregnancy, second trimester
Fetal Evaluation

 Num Of Fetuses:    1
 Fetal Heart Rate:  150                          bpm
 Cardiac Activity:  Observed
 Presentation:      Cephalic
Gestational Age

 LMP:           32w 4d        Date:  12/12/13                 EDD:   09/18/14
 Best:          26w 3d     Det. By:  Early Ultrasound         EDD:   10/31/14
Cervix Uterus Adnexa

 Cervical Length:    1        cm       Funnel Width:   1         cm
 Funnel Length:      1.5      cm

 Cervix:       Measured transvaginally.
Impression

 Single IUP at 26w 3d
 Cervical insufficiency - completed course of Tiger
 Limited ultrasound performed for cerical length
 TVUS - V-shaped funneling.  Cervical length 1.0 cm (some
 improvement over previous studies).  Anterior cerclage stitch
 visualized, but not appreciated posteriorly.

 Patient was previously offerred cervical pessary - declined
Recommendations

 Continue weekly CLs until 28 weeks
 PERUCH with us.  Please do not hesitate to contact

## 2015-11-11 ENCOUNTER — Ambulatory Visit
Admission: RE | Admit: 2015-11-11 | Discharge: 2015-11-11 | Disposition: A | Discharge status: Home / Self Care | Payer: 59 | Source: Ambulatory Visit | Attending: Nurse Practitioner | Admitting: Nurse Practitioner

## 2015-11-11 ENCOUNTER — Other Ambulatory Visit: Payer: Self-pay | Admitting: Nurse Practitioner

## 2015-11-11 DIAGNOSIS — M545 Low back pain: Secondary | ICD-10-CM

## 2015-12-24 IMAGING — US US OB LIMITED
1 series · 13 of 15 positions shown · non-contrast
Comparison: none

OBSTETRICS REPORT
(Signed Final 09/15/2014 [DATE])

ES
Service(s) Provided
[HOSPITAL]                                         76815.0
Indications
33 weeks gestation of pregnancy
Premature rupture of membranes - leaking fluid
(Positive Amnisure 08/18/14)
Cervical insufficiency, 3rd (S/P cerclage 06/26/14)
Pregnancy resulting from assisted reproductive
technology
Obesity complicating pregnancy, second trimester
Fetal Evaluation
Num Of Fetuses:    1
Fetal Heart Rate:  159                          bpm
Cardiac Activity:  Observed
Presentation:      Cephalic
Placenta:          Posterior, above cervical
os
P. Cord            Previously Visualized
Insertion:
Amniotic Fluid
AFI FV:      Subjectively within normal limits
AFI Sum:     10.73   cm       24  %Tile     Larg Pckt:    4.93  cm
RUQ:   2.56    cm   LUQ:    3.24   cm    LLQ:   4.93    cm
Gestational Age
LMP:           39w 4d        Date:  12/12/13                 EDD:   09/18/14
Best:          33w 3d     Det. By:  Early Ultrasound         EDD:   10/31/14
Anatomy
Stomach:          Appears normal, left   Bladder:          Appears normal
sided
Cervix Uterus Adnexa
Cervix:       Not visualized (advanced GA >44wks)
Left Ovary:    Within normal limits.
Right Ovary:   Within normal limits.
Adnexa:     No abnormality visualized.
Impression
INDICATION: 28 yr old G1P0 at 44w4d with PPROM and
cervical insufficiency s/p cerclage for AFI.

[Series 1: us ob limited · 0.26mm/px · 13 of 15 slices shown]
[im 1/15]
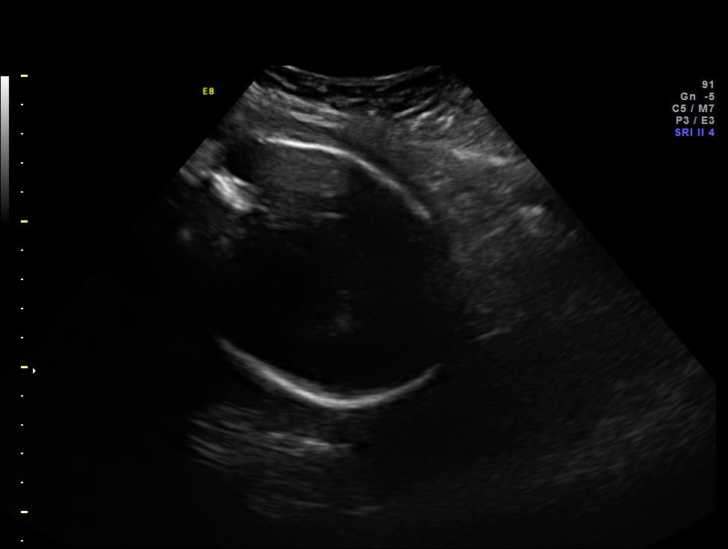
[im 2/15]
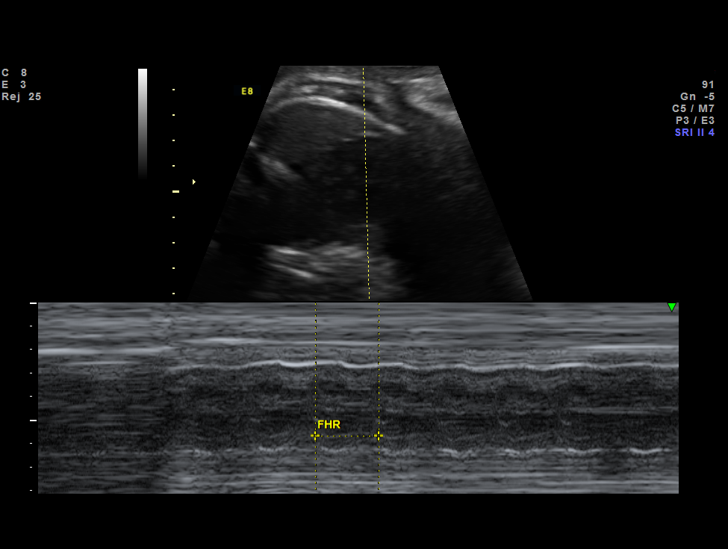
[im 3/15]
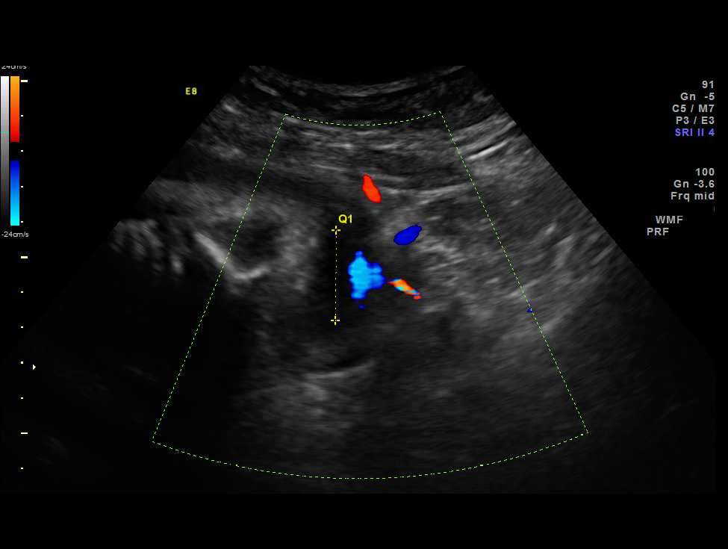
[im 5/15]
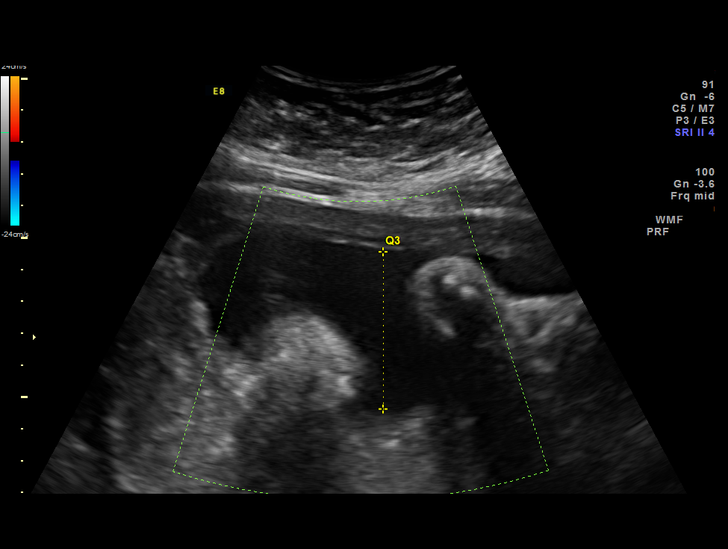
[im 6/15]
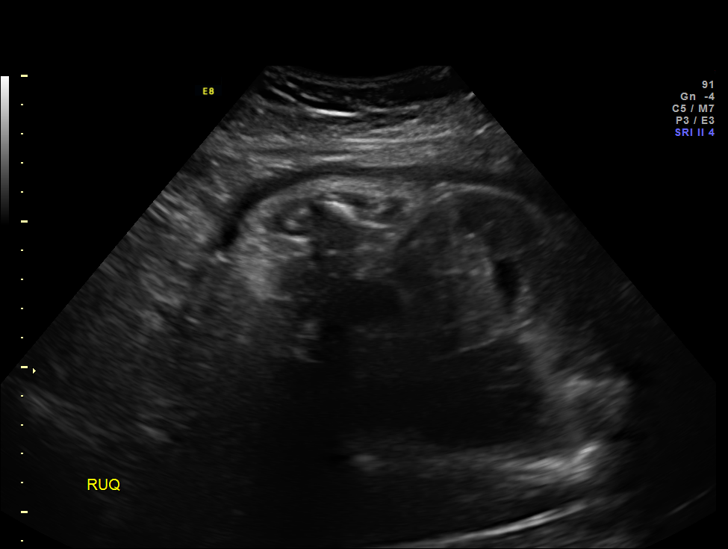
[im 7/15]
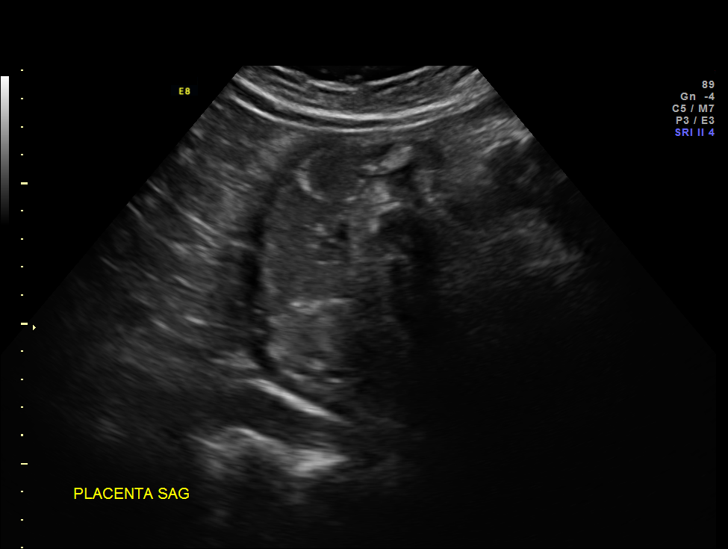
[im 8/15]
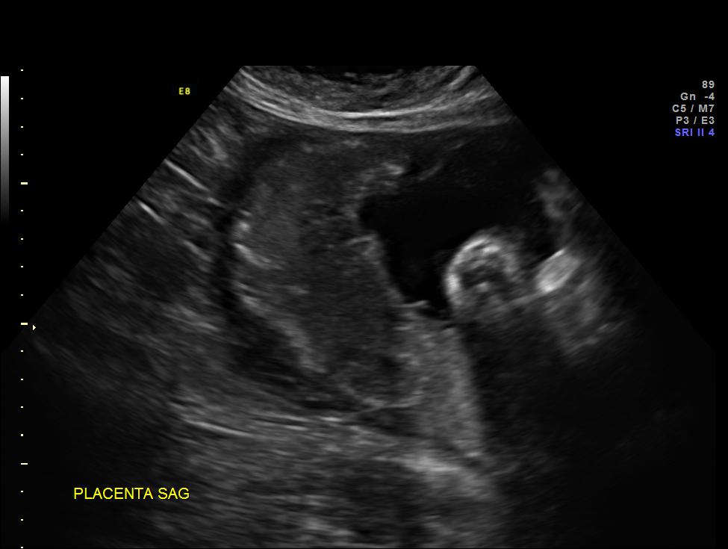
[im 9/15]
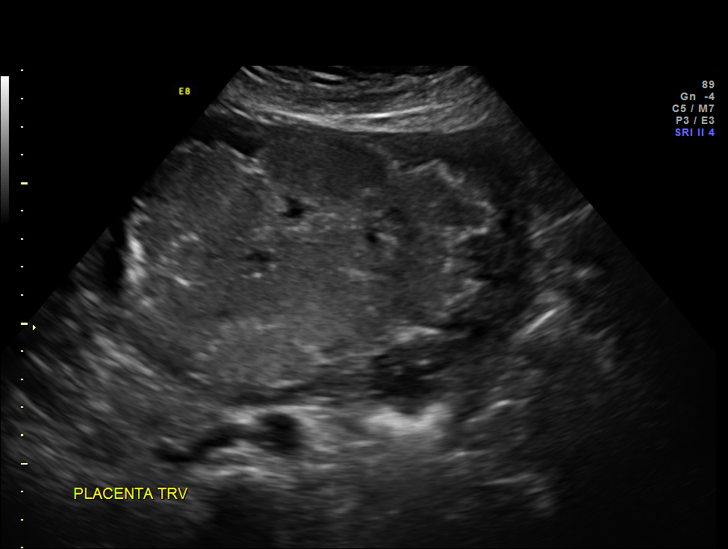
[im 10/15]
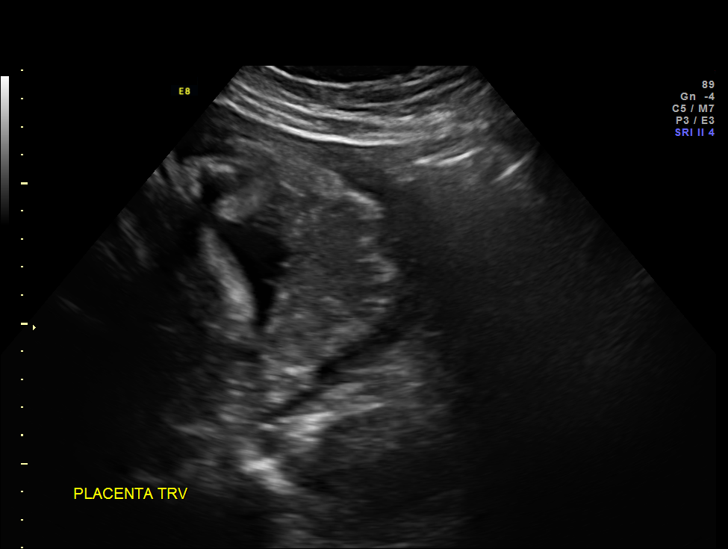
[im 11/15]
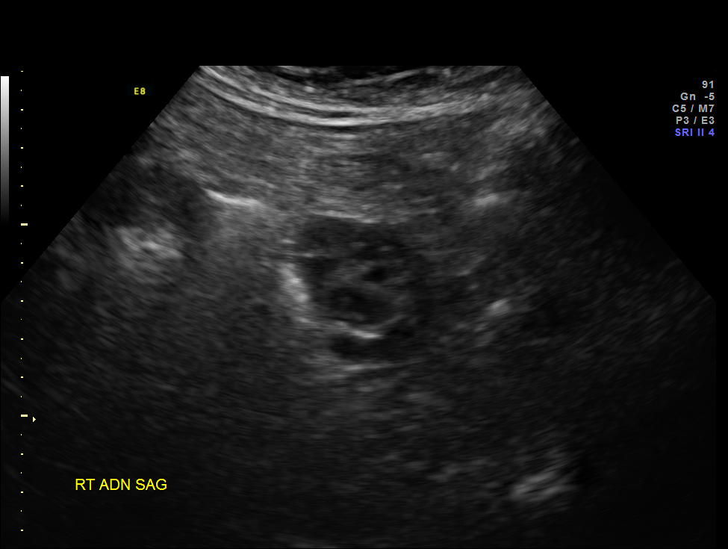
[im 13/15]
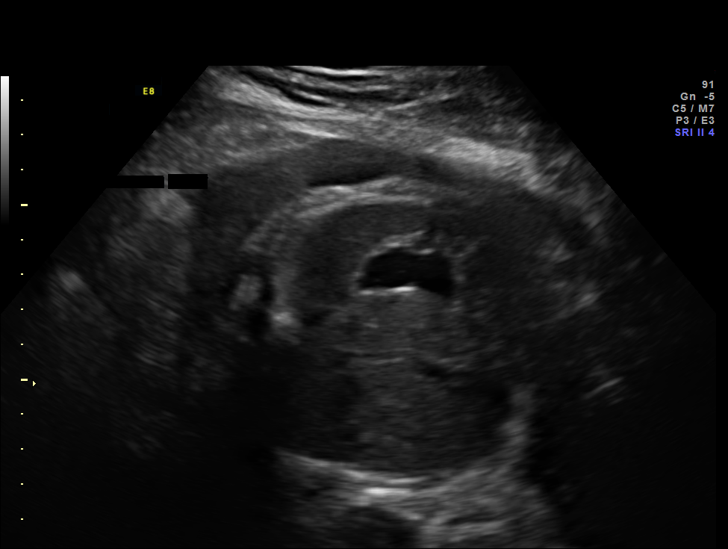
[im 14/15]
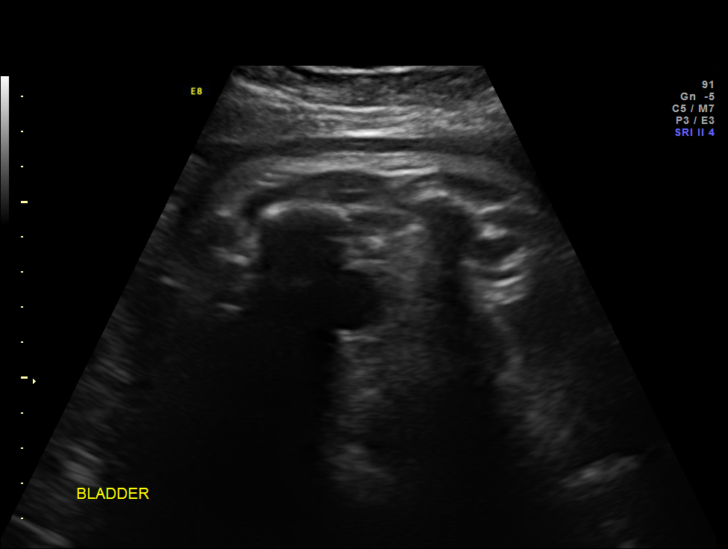
[im 15/15]
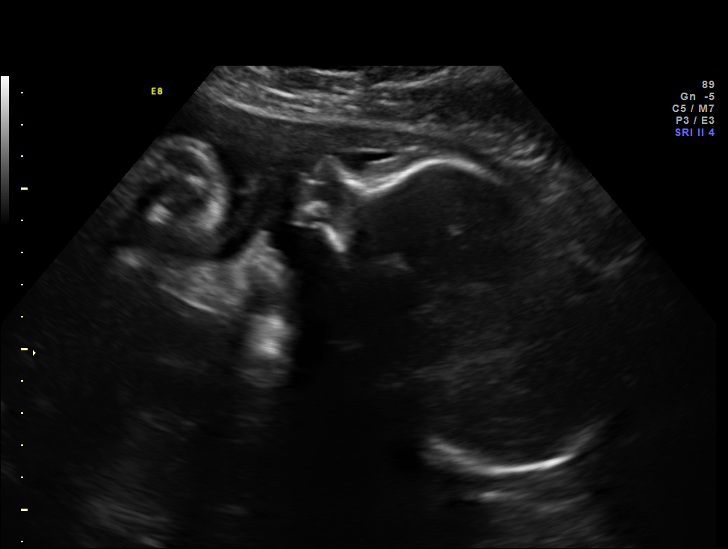

[13 of 15 positions shown; findings below may reference images not displayed]

FINDINGS: 1. Single intrauterine pregnancy.
2. Posterior placenta without evidence of previa.
3. Normal amniotic fluid index.
4. Normal bladder and stomach seen.
Recommendations

1. PPROM with cervical insufficiency:
- previously counseled
- has a cerclage
- Hemali/Limos M Sanyangore
- recommend inpatient management until delivery
2. Recent fetal growth within normal limits
3. Seizure disorder:
- previously counseled

TIGER with us.  Please do not hesitate to contact

## 2015-12-30 ENCOUNTER — Encounter: Payer: 59 | Admitting: Neurology

## 2015-12-30 ENCOUNTER — Telehealth: Payer: Self-pay | Admitting: Neurology

## 2015-12-30 NOTE — Telephone Encounter (Signed)
This patient did not show for EMG and nerve conduction study appointment.

## 2015-12-31 ENCOUNTER — Encounter: Payer: Self-pay | Admitting: Neurology

## 2016-07-12 ENCOUNTER — Encounter (HOSPITAL_COMMUNITY): Payer: Self-pay

## 2016-07-12 ENCOUNTER — Emergency Department (HOSPITAL_COMMUNITY)
Admission: EM | Admit: 2016-07-12 | Discharge: 2016-07-12 | Disposition: A | Discharge status: Home / Self Care | Payer: 59 | Attending: Emergency Medicine | Admitting: Emergency Medicine

## 2016-07-12 DIAGNOSIS — M545 Low back pain: Secondary | ICD-10-CM | POA: Diagnosis present

## 2016-07-12 DIAGNOSIS — I10 Essential (primary) hypertension: Secondary | ICD-10-CM | POA: Insufficient documentation

## 2016-07-12 DIAGNOSIS — M5442 Lumbago with sciatica, left side: Secondary | ICD-10-CM | POA: Diagnosis not present

## 2016-07-12 LAB — URINALYSIS, ROUTINE W REFLEX MICROSCOPIC
BILIRUBIN URINE: NEGATIVE
Glucose, UA: NEGATIVE mg/dL
Hgb urine dipstick: NEGATIVE
KETONES UR: 20 mg/dL — AB
LEUKOCYTES UA: NEGATIVE
NITRITE: NEGATIVE
PH: 5 (ref 5.0–8.0)
PROTEIN: NEGATIVE mg/dL
Specific Gravity, Urine: 1.024 (ref 1.005–1.030)

## 2016-07-12 LAB — POC URINE PREG, ED: Preg Test, Ur: NEGATIVE

## 2016-07-12 MED ORDER — KETOROLAC TROMETHAMINE 30 MG/ML IJ SOLN
30.0000 mg | Freq: Once | INTRAMUSCULAR | Status: AC
  Administered 2016-07-12: 30 mg via INTRAMUSCULAR
  Filled 2016-07-12: qty 1

## 2016-07-12 MED ORDER — METHYLPREDNISOLONE 4 MG PO TBPK: ORAL_TABLET | ORAL | 0 refills | Status: DC

## 2016-07-12 MED ORDER — METHOCARBAMOL 500 MG PO TABS: 500.0000 mg | ORAL_TABLET | Freq: Two times a day (BID) | ORAL | 0 refills | Status: DC

## 2016-07-12 MED ORDER — PREDNISONE 20 MG PO TABS
60.0000 mg | ORAL_TABLET | Freq: Once | ORAL | Status: AC
  Administered 2016-07-12: 60 mg via ORAL
  Filled 2016-07-12: qty 3

## 2016-07-12 MED ORDER — METHOCARBAMOL 500 MG PO TABS
500.0000 mg | ORAL_TABLET | Freq: Once | ORAL | Status: AC
  Administered 2016-07-12: 500 mg via ORAL
  Filled 2016-07-12: qty 1

## 2016-07-12 NOTE — Discharge Instructions (Signed)
Please read and follow all provided instructions.  Your diagnoses today include:  1. Acute left-sided low back pain with left-sided sciatica     Tests performed today include: Vital signs - see below for your results today  Medications prescribed:   Take any prescribed medications only as directed.  Home care instructions:  Follow any educational materials contained in this packet Please rest, use ice or heat on your back for the next several days Do not lift, push, pull anything more than 10 pounds for the next week  Follow-up instructions: Please follow-up with your primary care provider in the next 1 week for further evaluation of your symptoms.   Return instructions:  SEEK IMMEDIATE MEDICAL ATTENTION IF YOU HAVE: New numbness, tingling, weakness, or problem with the use of your arms or legs Severe back pain not relieved with medications Loss control of your bowels or bladder Increasing pain in any areas of the body (such as chest or abdominal pain) Shortness of breath, dizziness, or fainting.  Worsening nausea (feeling sick to your stomach), vomiting, fever, or sweats Any other emergent concerns regarding your health   Additional Information:  Your vital signs today were: BP 130/92 (BP Location: Right Arm)    Pulse 94    Temp 98.4 F (36.9 C) (Oral)    Resp 18    Ht 5\' 5"  (1.651 m)    Wt 104.3 kg    LMP 07/09/2016    SpO2 97%    BMI 38.27 kg/m  If your blood pressure (BP) was elevated above 135/85 this visit, please have this repeated by your doctor within one month. --------------

## 2016-07-12 NOTE — ED Provider Notes (Signed)
[facility]-EMERGENCY DEPT Provider Note   CSN: [id number] Arrival date & time: 07/12/16  0607     History   Chief Complaint Chief Complaint  Patient presents with  . Back Pain    HPI Brooke [name] is a 31 y.o. female.  HPI 31 y.o. female with a hx of HTN, presents to the Emergency Department today complaining of left lower back pain that radiates down left leg. Denies injury. Pt is able to ambulate. Rates pain 8/10. States that pain occurred with poor posture yesterday due to cleaning tanning beds. No loss of bowel or bladder function. No saddle anesthesia. Notes numbness down lateral aspect of left leg. No fevers. No N/V. No dysuria. No other symptoms noted.    Past Medical History:  Diagnosis Date  . Anxiety   . Epilepsy (HCC)    Last seizure 3 weeks ago  . Hypertension   . Migraines   . PCOS (polycystic ovarian syndrome)   . Polycystic disease, ovaries     Patient Active Problem List   Diagnosis Date Noted  . Postpartum care following vaginal delivery (5/16) 09/23/2014  . [redacted] weeks gestation of pregnancy   . [redacted] weeks gestation of pregnancy   . [redacted] weeks gestation of pregnancy   . Non-reactive NST (non-stress test)   . Cervical insufficiency during pregnancy, antepartum   . [redacted] weeks gestation of pregnancy   . [redacted] weeks gestation of pregnancy   . Encounter for fetal anatomic survey   . Obesity affecting pregnancy, antepartum   . [redacted] weeks gestation of pregnancy   . Cervical insufficiency during pregnancy in second trimester, antepartum   . Amniotic fluid leaking   . Abdominal pain in pregnancy   . [redacted] weeks gestation of pregnancy   . Cervical incompetence during pregnancy in second trimester 07/10/2014  . [redacted] weeks gestation of pregnancy   . Cervical cerclage suture present in second trimester   . Pregnancy resulting from assisted reproductive technology   . Cervical incompetence 06/26/2014  . 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 SINUSITIS, ACUTE 06/04/2007  . COUGH 05/09/2007  . HYPERTENSION 05/08/2007  . ALLERGIC RHINITIS 05/08/2007  . ASTHMA 05/08/2007  . G E R D 05/08/2007    Past Surgical History:  Procedure Laterality Date  . CERVICAL CERCLAGE N/A 06/26/2014   Procedure: CERCLAGE CERVICAL;  Surgeon: Lenoard Adenichard J Taavon, MD;  Location: [facility];  Service: Gynecology;  Laterality: N/A;  . TONSILLECTOMY    . WISDOM TOOTH EXTRACTION      OB History    Gravida Para Term Preterm AB Living   2 1   1   1    SAB TAB Ectopic Multiple Live Births         0 1       Home Medications    Prior to Admission medications   Not on File    Family History Family History  Problem Relation Age of Onset  . Diabetes Mother   . Hypertension Mother   . Hypertension Father   . Alcohol abuse Maternal Grandmother   . Hearing loss Paternal Grandfather     Social History Social History  Substance Use Topics  . Smoking status: Never Smoker  . Smokeless tobacco: Never Used  . Alcohol use No     Allergies   Patient has no known allergies.   Review of Systems Review of Systems  Constitutional: Negative for fever.  Musculoskeletal: Positive for back pain.   Physical  Exam Updated Vital Signs BP 130/92 (BP Location: Right Arm)   Pulse 94   Temp 98.4 F (36.9 C) (Oral)   Resp 18   Ht 5\' 5"  (1.651 m)   Wt 104.3 kg   LMP 07/09/2016   SpO2 97%   BMI 38.27 kg/m   Physical Exam  Constitutional: She is oriented to person, place, and time. Vital signs are normal. She appears well-developed and well-nourished.  HENT:  Head: Normocephalic.  Right Ear: Hearing normal.  Left Ear: Hearing normal.  Eyes: Conjunctivae and EOM are normal. Pupils are equal, round, and reactive to light.  Cardiovascular: Normal rate and regular rhythm.   Pulmonary/Chest: Effort normal.  Musculoskeletal:  Left lower back pain. TTP. No palpable or visible deformities along lumbar spine. Able to ambulate.   Neurological:  She is alert and oriented to person, place, and time.  Skin: Skin is warm and dry.  Psychiatric: She has a normal mood and affect. Her speech is normal and behavior is normal. Thought content normal.  Nursing note and vitals reviewed.  ED Treatments / Results  Labs (all labs ordered are listed, but only abnormal results are displayed) Labs Reviewed  URINALYSIS, ROUTINE W REFLEX MICROSCOPIC - Abnormal; Notable for the following:       Result Value   APPearance HAZY (*)    Ketones, ur 20 (*)    All other components within normal limits  POC URINE PREG, ED    EKG  EKG Interpretation None       Radiology No results found.  Procedures Procedures (including critical care time)  Medications Ordered in ED Medications - No data to display   Initial Impression / Assessment and Plan / ED Course  I have reviewed the triage vital signs and the nursing notes.  Pertinent labs & imaging results that were available during my care of the patient were reviewed by me and considered in my medical decision making (see chart for details).  Final Clinical Impressions(s) / ED Diagnoses  {I have reviewed and evaluated the relevant laboratory values.   {I have reviewed the relevant previous healthcare records.  {I obtained HPI from historian.   ED Course:  Assessment: Patient is a 31 y.o. female who presents to the ED with left sided back pain. Notes occurring after cleaning yesterday. Poor posture noted. Numbness on lateral aspect of leg. No meds PTA. No neurological deficits appreciated. Patient is ambulatory. No warning symptoms of back pain including: fecal incontinence, urinary retention or overflow incontinence, night sweats, waking from sleep with back pain, unexplained fevers or weight loss, h/o cancer, IVDU, recent trauma. No concern for cauda equina, epidural abscess, or other serious cause of back pain. Likely sciatic back pain. Given Robaxin, medrol, and anti inflammatories. Conservative  measures such as rest, ice/heat and pain medicine indicated with PCP follow-up if no improvement with conservative management.   Disposition/Plan:  DC Home Additional Verbal discharge instructions given and discussed with patient.  Pt Instructed to f/u with PCP in the next week for evaluation and treatment of symptoms. Return precautions given Pt acknowledges and agrees with plan  Supervising Physician Gerhard Munch, MD  Final diagnoses:  Acute left-sided low back pain with left-sided sciatica    New Prescriptions New Prescriptions   No medications on file     Audry Pili, PA-C 07/12/16 [id number]    Gerhard Munch, MD 07/12/16 [phone]

## 2016-07-12 NOTE — ED Notes (Signed)
Pt ambulatory to xray from triage d/t increased pain with sitting

## 2016-07-12 NOTE — ED Triage Notes (Signed)
Left side lower back pain that radiates down left leg. Denies injury. Pt reports not being able to ambulate or get out of bed without assistance

## 2016-08-15 ENCOUNTER — Encounter (HOSPITAL_COMMUNITY): Payer: Self-pay

## 2016-08-15 ENCOUNTER — Inpatient Hospital Stay (HOSPITAL_COMMUNITY)
Admission: AD | Admit: 2016-08-15 | Discharge: 2016-08-15 | Disposition: A | Discharge status: Home / Self Care | Payer: 59 | Source: Ambulatory Visit | Attending: Obstetrics | Admitting: Obstetrics

## 2016-08-15 DIAGNOSIS — R102 Pelvic and perineal pain: Secondary | ICD-10-CM | POA: Diagnosis present

## 2016-08-15 DIAGNOSIS — I10 Essential (primary) hypertension: Secondary | ICD-10-CM | POA: Diagnosis not present

## 2016-08-15 DIAGNOSIS — N939 Abnormal uterine and vaginal bleeding, unspecified: Secondary | ICD-10-CM | POA: Insufficient documentation

## 2016-08-15 DIAGNOSIS — G40909 Epilepsy, unspecified, not intractable, without status epilepticus: Secondary | ICD-10-CM | POA: Insufficient documentation

## 2016-08-15 DIAGNOSIS — Z8249 Family history of ischemic heart disease and other diseases of the circulatory system: Secondary | ICD-10-CM | POA: Insufficient documentation

## 2016-08-15 DIAGNOSIS — E282 Polycystic ovarian syndrome: Secondary | ICD-10-CM

## 2016-08-15 DIAGNOSIS — Z7984 Long term (current) use of oral hypoglycemic drugs: Secondary | ICD-10-CM | POA: Insufficient documentation

## 2016-08-15 DIAGNOSIS — Z833 Family history of diabetes mellitus: Secondary | ICD-10-CM | POA: Diagnosis not present

## 2016-08-15 DIAGNOSIS — F419 Anxiety disorder, unspecified: Secondary | ICD-10-CM | POA: Insufficient documentation

## 2016-08-15 LAB — URINALYSIS, ROUTINE W REFLEX MICROSCOPIC
BILIRUBIN URINE: NEGATIVE
Glucose, UA: NEGATIVE mg/dL
KETONES UR: NEGATIVE mg/dL
NITRITE: NEGATIVE
Protein, ur: NEGATIVE mg/dL
SPECIFIC GRAVITY, URINE: 1.011 (ref 1.005–1.030)
pH: 6 (ref 5.0–8.0)

## 2016-08-15 LAB — WET PREP, GENITAL
Clue Cells Wet Prep HPF POC: NONE SEEN
SPERM: NONE SEEN
Trich, Wet Prep: NONE SEEN
Yeast Wet Prep HPF POC: NONE SEEN

## 2016-08-15 LAB — POCT PREGNANCY, URINE: PREG TEST UR: NEGATIVE

## 2016-08-15 NOTE — Progress Notes (Addendum)
Presents to triage for pelvic and abdominal pain that started 1600 that comes and goes. No meds taken. Vaginal bleeding since over 1.5 wks. Denies odor.   Last intercourse 2 days ago and no issues  2051: Provider at bs assessing pt.   2054: wet prep and GC, and pelvic exam done.   Wet prep result negative. D/c home  Discharge instructions given with pt understanding to follow up care with [facility] provider. Pt left unit with husband via ambulatory.

## 2016-08-15 NOTE — MAU Note (Signed)
Pt c/o lower abdominal pain that started around 4pm today. States it is sharp/stabbing-rates 7/10. States she has not taken anything for pain. Pt is having some vaginal spotting that has been going on for about 1.5 weeks. Denies odor/itching. Pt states she has PCOS and thinks shes ending her period.

## 2016-08-15 NOTE — MAU Provider Note (Signed)
History     CSN: [id number]  Arrival date and time: 08/15/16 4098   First Provider Initiated Contact with Patient 08/15/16 2059      Chief Complaint  Patient presents with  . Pelvic Pain   Pelvic Pain  The patient's primary symptoms include pelvic pain. The patient's pertinent negatives include no vaginal discharge. This is a new problem. The current episode started today. The problem occurs intermittently. The problem has been unchanged. Pain severity now: No pain at this time, but when the pain comes she states that it is 7/10. She cannot detect a pattern to the intermittent pain.  The problem affects both sides. She is not pregnant. Pertinent negatives include no chills, constipation, diarrhea, dysuria, fever, frequency, nausea, urgency or vomiting. The vaginal bleeding is spotting (has had bleeding and spotting x 1.5 weeks. Has PCOS, and irregular periods. ). Nothing aggravates the symptoms. She has tried nothing for the symptoms. She is sexually active. She uses nothing for contraception. Her menstrual history has been irregular.    Past Medical History:  Diagnosis Date  . Anxiety   . Epilepsy (HCC)    Last seizure 3 weeks ago  . Hypertension   . Migraines   . PCOS (polycystic ovarian syndrome)   . Polycystic disease, ovaries     Past Surgical History:  Procedure Laterality Date  . CERVICAL CERCLAGE N/A 06/26/2014   Procedure: CERCLAGE CERVICAL;  Surgeon: Lenoard Aden, MD;  Location: [facility];  Service: Gynecology;  Laterality: N/A;  . TONSILLECTOMY    . WISDOM TOOTH EXTRACTION      Family History  Problem Relation Age of Onset  . Diabetes Mother   . Hypertension Mother   . Hypertension Father   . Alcohol abuse Maternal Grandmother   . Hearing loss Paternal Grandfather     Social History  Substance Use Topics  . Smoking status: Never Smoker  . Smokeless tobacco: Never Used  . Alcohol use No    Allergies: No Known Allergies  Prescriptions Prior to Admission   Medication Sig Dispense Refill Last Dose  . metFORMIN (GLUCOPHAGE-XR) 750 MG 24 hr tablet Take 750 mg by mouth daily with breakfast.   08/14/2016 at Unknown time  . phentermine 37.5 MG capsule Take 37.5 mg by mouth every morning.   08/15/2016 at Unknown time  . Prenatal Vit-Fe Fumarate-FA (PRENATAL MULTIVITAMIN) TABS tablet Take 1 tablet by mouth daily at 12 noon.   08/14/2016 at Unknown time  . methocarbamol (ROBAXIN) 500 MG tablet Take 1 tablet (500 mg total) by mouth 2 (two) times daily. (Patient not taking: Reported on 08/15/2016) 20 tablet 0 Not Taking at Unknown time  . methylPREDNISolone (MEDROL DOSEPAK) 4 MG TBPK tablet Take as written (Patient not taking: Reported on 08/15/2016) 21 tablet 0 Not Taking at Unknown time    Review of Systems  Constitutional: Negative for chills and fever.  Gastrointestinal: Negative for constipation, diarrhea, nausea and vomiting.  Genitourinary: Positive for pelvic pain and vaginal bleeding. Negative for dysuria, frequency, urgency and vaginal discharge.   Physical Exam   Blood pressure (!) 144/97, pulse 86, temperature 98.6 F (37 C), temperature source Oral, resp. rate 17, height 5' 5.5" (1.664 m), weight 228 lb (103.4 kg), SpO2 98 %, unknown if currently breastfeeding.  Physical Exam  Nursing note and vitals reviewed. Constitutional: She is oriented to person, place, and time. She appears well-developed and well-nourished. No distress.  HENT:  Head: Normocephalic.  Cardiovascular: Normal rate.   Respiratory: Effort normal.  GI: Soft. There is no tenderness. There is no rebound.  Genitourinary:  Genitourinary Comments:  External: no lesion Vagina: small amount of blood seen Cervix: pink, smooth, no CMT Uterus: NSSC Adnexa: NT   Neurological: She is alert and oriented to person, place, and time.  Skin: Skin is warm and dry.  Psychiatric: She has a normal mood and affect.   Results for orders placed or performed during the hospital encounter of  08/15/16 (from the past 24 hour(s))  Urinalysis, Routine w reflex microscopic     Status: Abnormal   Collection Time: 08/15/16  7:06 PM  Result Value Ref Range   Color, Urine YELLOW YELLOW   APPearance CLEAR CLEAR   Specific Gravity, Urine 1.011 1.005 - 1.030   pH 6.0 5.0 - 8.0   Glucose, UA NEGATIVE NEGATIVE mg/dL   Hgb urine dipstick LARGE (A) NEGATIVE   Bilirubin Urine NEGATIVE NEGATIVE   Ketones, ur NEGATIVE NEGATIVE mg/dL   Protein, ur NEGATIVE NEGATIVE mg/dL   Nitrite NEGATIVE NEGATIVE   Leukocytes, UA TRACE (A) NEGATIVE   RBC / HPF 0-5 0 - 5 RBC/hpf   WBC, UA 6-30 0 - 5 WBC/hpf   Bacteria, UA RARE (A) NONE SEEN   Squamous Epithelial / LPF 0-5 (A) NONE SEEN   Mucous PRESENT   Pregnancy, urine POC     Status: None   Collection Time: 08/15/16  7:25 PM  Result Value Ref Range   Preg Test, Ur NEGATIVE NEGATIVE  Wet prep, genital     Status: Abnormal   Collection Time: 08/15/16  8:57 PM  Result Value Ref Range   Yeast Wet Prep HPF POC NONE SEEN NONE SEEN   Trich, Wet Prep NONE SEEN NONE SEEN   Clue Cells Wet Prep HPF POC NONE SEEN NONE SEEN   WBC, Wet Prep HPF POC MODERATE (A) NONE SEEN   Sperm NONE SEEN     MAU Course  Procedures  MDM 2104: OK for DC home. Could be possible constipation. Recommend stool softener and probiotic, and as far bleeding patient can make an appointment to be seen in the office for FU.   Assessment and Plan   1. Pelvic pain   2. Abnormal uterine bleeding   3. PCOS (polycystic ovarian syndrome)    DC home Comfort measures reviewed  Bleeding precautions RX: none Return to MAU as needed Make appointment with OBGYN for follow up.   Follow-up Information    Lenoard Aden, MD. Schedule an appointment as soon as possible for a visit.   Specialty:  Obstetrics and Gynecology Contact information: [address] [place] [address] [phone]            Tawnya Crook 08/15/2016, 8:59 PM

## 2016-08-15 NOTE — Discharge Instructions (Signed)
Abnormal Uterine Bleeding Abnormal uterine bleeding can affect women at various stages in life, including teenagers, women in their reproductive years, pregnant women, and women who have reached menopause. Several kinds of uterine bleeding are considered abnormal, including:  Bleeding or spotting between periods.  Bleeding after sexual intercourse.  Bleeding that is heavier or more than normal.  Periods that last longer than usual.  Bleeding after menopause. Many cases of abnormal uterine bleeding are minor and simple to treat, while others are more serious. Any type of abnormal bleeding should be evaluated by your health care provider. Treatment will depend on the cause of the bleeding. Follow these instructions at home: Monitor your condition for any changes. The following actions may help to alleviate any discomfort you are experiencing:  Avoid the use of tampons and douches as directed by your health care provider.  Change your pads frequently. You should get regular pelvic exams and Pap tests. Keep all follow-up appointments for diagnostic tests as directed by your health care provider. Contact a health care provider if:  Your bleeding lasts more than 1 week.  You feel dizzy at times. Get help right away if:  You pass out.  You are changing pads every 15 to 30 minutes.  You have abdominal pain.  You have a fever.  You become sweaty or weak.  You are passing large blood clots from the vagina.  You start to feel nauseous and vomit. This information is not intended to replace advice given to you by your health care provider. Make sure you discuss any questions you have with your health care provider. Document Released: 04/25/2005 Document Revised: 10/07/2015 Document Reviewed: 11/22/2012 Elsevier Interactive Patient Education  2017 [employer]  

## 2016-08-16 LAB — GC/CHLAMYDIA PROBE AMP (~~LOC~~) NOT AT ARMC
Chlamydia: NEGATIVE
NEISSERIA GONORRHEA: NEGATIVE

## 2017-01-27 ENCOUNTER — Other Ambulatory Visit: Payer: Self-pay | Admitting: Obstetrics and Gynecology

## 2017-02-13 ENCOUNTER — Encounter (HOSPITAL_COMMUNITY): Payer: Self-pay

## 2017-02-13 NOTE — Patient Instructions (Addendum)
Your procedure is scheduled on:  Thursday, Oct 11 at 10:15 am  Enter through the Main Entrance of [facility] at:  8:45 am  Pick up the phone at the desk and dial [phone].  Call this number if you have problems the morning of surgery: [phone].  Remember: Do NOT eat food or drink clear liquids (including water) after midnight Wednesday  Take these medicines the morning of surgery with a SIP OF WATER:  Protonix  Stop herbal medications and supplements at this time.  Do NOT wear jewelry (body piercing), metal hair clips/bobby pins, make-up, or nail polish. Do NOT wear lotions, powders, or perfumes.  You may wear deoderant. Do NOT shave for 48 hours prior to surgery. Do NOT bring valuables to the hospital.  Have a responsible adult drive you home and stay with you for 24 hours after your procedure.  Home with mother Patty cell [phone].

## 2017-02-14 ENCOUNTER — Encounter (HOSPITAL_COMMUNITY): Payer: Self-pay

## 2017-02-14 ENCOUNTER — Encounter (HOSPITAL_COMMUNITY)
Admission: RE | Admit: 2017-02-14 | Discharge: 2017-02-14 | Disposition: A | Discharge status: Home / Self Care | Payer: 59 | Source: Ambulatory Visit | Attending: Obstetrics and Gynecology | Admitting: Obstetrics and Gynecology

## 2017-02-14 DIAGNOSIS — N840 Polyp of corpus uteri: Secondary | ICD-10-CM | POA: Diagnosis not present

## 2017-02-14 DIAGNOSIS — Z885 Allergy status to narcotic agent status: Secondary | ICD-10-CM | POA: Diagnosis not present

## 2017-02-14 DIAGNOSIS — Z8249 Family history of ischemic heart disease and other diseases of the circulatory system: Secondary | ICD-10-CM | POA: Diagnosis not present

## 2017-02-14 DIAGNOSIS — N939 Abnormal uterine and vaginal bleeding, unspecified: Secondary | ICD-10-CM | POA: Diagnosis present

## 2017-02-14 DIAGNOSIS — Z833 Family history of diabetes mellitus: Secondary | ICD-10-CM | POA: Diagnosis not present

## 2017-02-14 DIAGNOSIS — Z01812 Encounter for preprocedural laboratory examination: Secondary | ICD-10-CM | POA: Diagnosis not present

## 2017-02-14 HISTORY — DX: Gastro-esophageal reflux disease without esophagitis: K21.9

## 2017-02-14 LAB — CBC
HEMATOCRIT: 42.6 % (ref 36.0–46.0)
HEMOGLOBIN: 14.6 g/dL (ref 12.0–15.0)
MCH: 30.9 pg (ref 26.0–34.0)
MCHC: 34.3 g/dL (ref 30.0–36.0)
MCV: 90.1 fL (ref 78.0–100.0)
Platelets: 257 10*3/uL (ref 150–400)
RBC: 4.73 MIL/uL (ref 3.87–5.11)
RDW: 12.9 % (ref 11.5–15.5)
WBC: 5.9 10*3/uL (ref 4.0–10.5)

## 2017-02-15 NOTE — H&P (Signed)
NAME:  Brooke [name], Brooke [name]        ACCOUNT NO.:  [id number]  MEDICAL RECORD NO.:  [id number]  LOCATION:                                 FACILITY:  PHYSICIAN:  Lenoard Aden, M.D.     DATE OF BIRTH:  DATE OF ADMISSION: DATE OF DISCHARGE:                             HISTORY & PHYSICAL   CHIEF COMPLAINT:  Abnormal uterine bleeding with structural lesion.  HISTORY OF PRESENT ILLNESS:  A 31 year old white female, G1, P1, with abnormal bleeding and structural lesion on ultrasound, for diagnostic hysteroscopy.  ALLERGIES:  She has allergies to CODEINE.  MEDICATIONS: 1. Metformin. 2. Contraceptive pills.  FAMILY HISTORY:  She has a family history of hypertension, diabetes, and heart disease.  PAST MEDICAL HISTORY:  History of vaginal delivery x1.  PHYSICAL EXAMINATION:  GENERAL:  A well-developed, well-nourished, white female, in no acute distress. HEENT:  Normal. NECK:  Supple.  Full range of motion. LUNGS:  Clear. HEART:  Regular rate and rhythm. ABDOMEN:  Soft, nontender. PELVIC:  Reveals normal-sized uterus and no adnexal masses. EXTREMITIES:  No cords. NEUROLOGIC:  Nonfocal. SKIN:  Intact.  IMPRESSION:  Abnormal uterine bleeding with structural lesion.  PLAN:  Diagnostic hysteroscopy, D and C, possible MyoSure.  Risks of anesthesia, infection, bleeding, injury to surrounding organs, and possible need for repair were discussed.  Delayed versus immediate complications to include bowel and bladder injury noted.  The patient acknowledges and wishes to proceed.     Lenoard Aden, M.D.     RJT/MEDQ  D:  02/15/2017  T:  02/15/2017  Job:  [id number]

## 2017-02-16 ENCOUNTER — Encounter (HOSPITAL_COMMUNITY)
Admission: RE | Disposition: A | Discharge status: Home / Self Care | Payer: Self-pay | Source: Ambulatory Visit | Attending: Obstetrics and Gynecology

## 2017-02-16 ENCOUNTER — Encounter (HOSPITAL_COMMUNITY): Payer: Self-pay | Admitting: *Deleted

## 2017-02-16 ENCOUNTER — Ambulatory Visit (HOSPITAL_COMMUNITY): Payer: 59 | Admitting: Certified Registered Nurse Anesthetist

## 2017-02-16 ENCOUNTER — Ambulatory Visit (HOSPITAL_COMMUNITY)
Admission: RE | Admit: 2017-02-16 | Discharge: 2017-02-16 | Disposition: A | Discharge status: Home / Self Care | Payer: 59 | Source: Ambulatory Visit | Attending: Obstetrics and Gynecology | Admitting: Obstetrics and Gynecology

## 2017-02-16 DIAGNOSIS — N939 Abnormal uterine and vaginal bleeding, unspecified: Secondary | ICD-10-CM | POA: Insufficient documentation

## 2017-02-16 DIAGNOSIS — Z01812 Encounter for preprocedural laboratory examination: Secondary | ICD-10-CM | POA: Insufficient documentation

## 2017-02-16 DIAGNOSIS — Z833 Family history of diabetes mellitus: Secondary | ICD-10-CM | POA: Insufficient documentation

## 2017-02-16 DIAGNOSIS — N840 Polyp of corpus uteri: Secondary | ICD-10-CM | POA: Insufficient documentation

## 2017-02-16 DIAGNOSIS — Z8249 Family history of ischemic heart disease and other diseases of the circulatory system: Secondary | ICD-10-CM | POA: Insufficient documentation

## 2017-02-16 DIAGNOSIS — Z885 Allergy status to narcotic agent status: Secondary | ICD-10-CM | POA: Insufficient documentation

## 2017-02-16 HISTORY — DX: Nonscarring hair loss, unspecified: L65.9

## 2017-02-16 HISTORY — PX: DILATATION & CURETTAGE/HYSTEROSCOPY WITH MYOSURE: SHX6511

## 2017-02-16 LAB — HCG, SERUM, QUALITATIVE: PREG SERUM: NEGATIVE

## 2017-02-16 SURGERY — DILATATION & CURETTAGE/HYSTEROSCOPY WITH MYOSURE
Anesthesia: General | Site: Vagina

## 2017-02-16 MED ORDER — LACTATED RINGERS IV SOLN
INTRAVENOUS | Status: DC
  Administered 2017-02-16 (×2): via INTRAVENOUS

## 2017-02-16 MED ORDER — BUPIVACAINE HCL (PF) 0.25 % IJ SOLN
INTRAMUSCULAR | Status: AC
  Filled 2017-02-16: qty 30

## 2017-02-16 MED ORDER — SCOPOLAMINE 1 MG/3DAYS TD PT72
MEDICATED_PATCH | TRANSDERMAL | Status: AC
  Filled 2017-02-16: qty 1

## 2017-02-16 MED ORDER — SODIUM CHLORIDE 0.9 % IR SOLN
Status: DC | PRN
  Administered 2017-02-16: 3000 mL

## 2017-02-16 MED ORDER — FENTANYL CITRATE (PF) 100 MCG/2ML IJ SOLN
INTRAMUSCULAR | Status: DC | PRN
  Administered 2017-02-16 (×2): 50 ug via INTRAVENOUS

## 2017-02-16 MED ORDER — KETOROLAC TROMETHAMINE 30 MG/ML IJ SOLN
INTRAMUSCULAR | Status: DC | PRN
  Administered 2017-02-16: 30 mg via INTRAVENOUS

## 2017-02-16 MED ORDER — CEFAZOLIN SODIUM-DEXTROSE 2-4 GM/100ML-% IV SOLN
2.0000 g | INTRAVENOUS | Status: AC
  Administered 2017-02-16: 2 g via INTRAVENOUS

## 2017-02-16 MED ORDER — FENTANYL CITRATE (PF) 100 MCG/2ML IJ SOLN
INTRAMUSCULAR | Status: AC
  Filled 2017-02-16: qty 2

## 2017-02-16 MED ORDER — SODIUM CHLORIDE 0.9 % IJ SOLN
INTRAMUSCULAR | Status: AC
  Filled 2017-02-16: qty 50

## 2017-02-16 MED ORDER — FENTANYL CITRATE (PF) 100 MCG/2ML IJ SOLN
25.0000 ug | INTRAMUSCULAR | Status: DC | PRN
Start: 2017-02-16 — End: 2017-02-16

## 2017-02-16 MED ORDER — SODIUM CHLORIDE 0.9 % IV SOLN
INTRAVENOUS | Status: DC | PRN
  Administered 2017-02-16: 20 mL via INTRAMUSCULAR

## 2017-02-16 MED ORDER — MIDAZOLAM HCL 2 MG/2ML IJ SOLN
INTRAMUSCULAR | Status: DC | PRN
Start: 2017-02-16 — End: 2017-02-16
  Administered 2017-02-16: 2 mg via INTRAVENOUS

## 2017-02-16 MED ORDER — VASOPRESSIN 20 UNIT/ML IV SOLN
INTRAVENOUS | Status: AC
  Filled 2017-02-16: qty 1

## 2017-02-16 MED ORDER — LIDOCAINE HCL (CARDIAC) 20 MG/ML IV SOLN
INTRAVENOUS | Status: DC | PRN
  Administered 2017-02-16: 50 mg via INTRAVENOUS

## 2017-02-16 MED ORDER — KETOROLAC TROMETHAMINE 30 MG/ML IJ SOLN
INTRAMUSCULAR | Status: AC
  Filled 2017-02-16: qty 1

## 2017-02-16 MED ORDER — DEXAMETHASONE SODIUM PHOSPHATE 4 MG/ML IJ SOLN
INTRAMUSCULAR | Status: AC
  Filled 2017-02-16: qty 1

## 2017-02-16 MED ORDER — BUPIVACAINE HCL (PF) 0.25 % IJ SOLN
INTRAMUSCULAR | Status: DC | PRN
  Administered 2017-02-16: 20 mL

## 2017-02-16 MED ORDER — MIDAZOLAM HCL 2 MG/2ML IJ SOLN
INTRAMUSCULAR | Status: AC
  Filled 2017-02-16: qty 2

## 2017-02-16 MED ORDER — DEXAMETHASONE SODIUM PHOSPHATE 10 MG/ML IJ SOLN
INTRAMUSCULAR | Status: DC | PRN
  Administered 2017-02-16: 4 mg via INTRAVENOUS

## 2017-02-16 MED ORDER — ONDANSETRON HCL 4 MG/2ML IJ SOLN
INTRAMUSCULAR | Status: AC
  Filled 2017-02-16: qty 2

## 2017-02-16 MED ORDER — LIDOCAINE HCL (CARDIAC) 20 MG/ML IV SOLN
INTRAVENOUS | Status: AC
  Filled 2017-02-16: qty 5

## 2017-02-16 MED ORDER — ONDANSETRON HCL 4 MG/2ML IJ SOLN
INTRAMUSCULAR | Status: DC | PRN
  Administered 2017-02-16: 4 mg via INTRAVENOUS

## 2017-02-16 MED ORDER — PROPOFOL 10 MG/ML IV BOLUS
INTRAVENOUS | Status: DC | PRN
  Administered 2017-02-16: 200 mg via INTRAVENOUS

## 2017-02-16 MED ORDER — SCOPOLAMINE 1 MG/3DAYS TD PT72
1.0000 | MEDICATED_PATCH | Freq: Once | TRANSDERMAL | Status: DC
  Administered 2017-02-16: 1.5 mg via TRANSDERMAL

## 2017-02-16 MED ORDER — PROPOFOL 10 MG/ML IV BOLUS
INTRAVENOUS | Status: AC
  Filled 2017-02-16: qty 20

## 2017-02-16 MED ORDER — TRAMADOL HCL 50 MG PO TABS: 50.0000 mg | ORAL_TABLET | Freq: Four times a day (QID) | ORAL | 0 refills | Status: DC | PRN

## 2017-02-16 SURGICAL SUPPLY — 21 items
CANISTER SUCT 3000ML PPV (MISCELLANEOUS) ×3 IMPLANT
CATH ROBINSON RED A/P 16FR (CATHETERS) ×3 IMPLANT
CONTAINER PREFILL 10% NBF 60ML (FORM) ×6 IMPLANT
DECANTER SPIKE VIAL GLASS SM (MISCELLANEOUS) ×6 IMPLANT
DEVICE MYOSURE LITE (MISCELLANEOUS) ×2 IMPLANT
DEVICE MYOSURE REACH (MISCELLANEOUS) IMPLANT
FILTER ARTHROSCOPY CONVERTOR (FILTER) ×3 IMPLANT
GLOVE BIO SURGEON STRL SZ7.5 (GLOVE) ×3 IMPLANT
GLOVE BIOGEL PI IND STRL 7.0 (GLOVE) ×1 IMPLANT
GLOVE BIOGEL PI INDICATOR 7.0 (GLOVE) ×2
GOWN STRL REUS W/TWL LRG LVL3 (GOWN DISPOSABLE) ×6 IMPLANT
NDL SPNL 22GX3.5 QUINCKE BK (NEEDLE) ×2 IMPLANT
NEEDLE SPNL 22GX3.5 QUINCKE BK (NEEDLE) ×6 IMPLANT
PACK VAGINAL MINOR WOMEN LF (CUSTOM PROCEDURE TRAY) ×3 IMPLANT
PAD OB MATERNITY 4.3X12.25 (PERSONAL CARE ITEMS) ×3 IMPLANT
SEAL ROD LENS SCOPE MYOSURE (ABLATOR) ×3 IMPLANT
SYR CONTROL 10ML LL (SYRINGE) ×3 IMPLANT
SYR TB 1ML 25GX5/8 (SYRINGE) ×3 IMPLANT
TOWEL OR 17X24 6PK STRL BLUE (TOWEL DISPOSABLE) ×6 IMPLANT
TUBING AQUILEX INFLOW (TUBING) ×3 IMPLANT
TUBING AQUILEX OUTFLOW (TUBING) ×3 IMPLANT

## 2017-02-16 NOTE — Discharge Instructions (Signed)
DISCHARGE INSTRUCTIONS: HYSTEROSCOPY  The following instructions have been prepared to help you care for yourself upon your return home.  May Remove Scop patch on or before 02/19/17  May take Ibuprofen after 5:00pm today  May take stool softner while taking narcotic pain medication to prevent constipation.  Drink plenty of water.  Personal hygiene:  Use sanitary pads for vaginal drainage, not tampons.  Shower the day after your procedure.  NO tub baths, pools or Jacuzzis for 2-3 weeks.  Wipe front to back after using the bathroom.  Activity and limitations:  Do NOT drive or operate any equipment for 24 hours. The effects of anesthesia are still present and drowsiness may result.  Do NOT rest in bed all day.  Walking is encouraged.  Walk up and down stairs slowly.  You may resume your normal activity in one to two days or as indicated by your physician. Sexual activity: NO intercourse for at least 2 weeks after the procedure, or as indicated by your Doctor.  Diet: Eat a light meal as desired this evening. You may resume your usual diet tomorrow.  Return to Work: You may resume your work activities in one to two days or as indicated by Therapist, sports.  What to expect after your surgery: Expect to have vaginal bleeding/discharge for 2-3 days and spotting for up to 10 days. It is not unusual to have soreness for up to 1-2 weeks. You may have a slight burning sensation when you urinate for the first day. Mild cramps may continue for a couple of days. You may have a regular period in 2-6 weeks.  Call your doctor for any of the following:  Excessive vaginal bleeding or clotting, saturating and changing one pad every hour.  Inability to urinate 6 hours after discharge from hospital.  Pain not relieved by pain medication.  Fever of 100.4 F or greater.  Unusual vaginal discharge or odor.  Return to office _________________Call for an appointment  ___________________ Patients signature: ______________________ Nurses signature ________________________  Post Anesthesia Care Unit [phone]    Post Anesthesia Home Care Instructions  Activity: Get plenty of rest for the remainder of the day. A responsible individual must stay with you for 24 hours following the procedure.  For the next 24 hours, DO NOT: -Drive a car -Advertising copywriter -Drink alcoholic beverages -Take any medication unless instructed by your physician -Make any legal decisions or sign important papers.  Meals: Start with liquid foods such as gelatin or soup. Progress to regular foods as tolerated. Avoid greasy, spicy, heavy foods. If nausea and/or vomiting occur, drink only clear liquids until the nausea and/or vomiting subsides. Call your physician if vomiting continues.  Special Instructions/Symptoms: Your throat may feel dry or sore from the anesthesia or the breathing tube placed in your throat during surgery. If this causes discomfort, gargle with warm salt water. The discomfort should disappear within 24 hours.  If you had a scopolamine patch placed behind your ear for the management of post- operative nausea and/or vomiting:  1. The medication in the patch is effective for 72 hours, after which it should be removed.  Wrap patch in a tissue and discard in the trash. Wash hands thoroughly with soap and water. 2. You may remove the patch earlier than 72 hours if you experience unpleasant side effects which may include dry mouth, dizziness or visual disturbances. 3. Avoid touching the patch. Wash your hands with soap and water after contact with the patch.

## 2017-02-16 NOTE — Op Note (Signed)
NAMEWindell [name]         ACCOUNT NO.:  [id number]  MEDICAL RECORD NO.:  [id number]  LOCATION:                                 FACILITY:  PHYSICIAN:  Lenoard Aden, M.D.     DATE OF BIRTH:  DATE OF PROCEDURE: DATE OF DISCHARGE:                              OPERATIVE REPORT   PREOPERATIVE DIAGNOSIS:  Abnormal uterine bleeding with structural lesion.  POSTOPERATIVE DIAGNOSES: 1. Abnormal uterine bleeding with structural lesion. 2. Multiple endometrial polyps.  PROCEDURES: 1. Diagnostic hysteroscopy. 2. D and C. 3. MyoSure resection of multiple posterior wall endometrial polyps.  SURGEON:  Lenoard Aden, M.D.  ASSISTANT:  None.  ANESTHESIA:  Local and general.  ESTIMATED BLOOD LOSS:  Less than 50 mL.  FLUID DEFICIT:  Less than 100 mL.  COMPLICATIONS:  None.  DRAINS:  None.  COUNTS:  Correct.  The patient to recovery in good condition.  SPECIMEN:  Endometrial polyps and endometrial curettings to Pathology.  BRIEF OPERATIVE NOTE:  After being apprised of risks of anesthesia, infection, bleeding, injury to surrounding organs, possible need for repair, delayed versus immediate complications to include bowel and bladder injury, possible need for repair, the patient was brought to the operating room, where she was administered general anesthetic without complications.  Prepped and draped in usual sterile fashion, catheterized until the bladder was empty.  Exam under anesthesia reveals a midposition uterus with no adnexal masses.  Dilute Marcaine solution placed, paracervical block, 20 mL total.  Dilute Pitressin solution placed, 20 mL total, 3 and 9 o'clock at the cervicovaginal junction. Cervix easily dilated up to 25 Pratt dilator.  Hysteroscope placed. Visualization revealed multiple polypoid areas along the posterior wall of the endometrium, these were resected using multiple passes with MyoSure Lite device.  Good resection done at the base was  noted.  No evidence of further polypoid formation was noted.  D and C was performed using sharp curettage in a 4-quadrant method.  Revisualization revealed a normal endometrial cavity.  No evidence of perforation.  Fluid deficit was noted.  The patient tolerated the procedure well, was awakened, and transferred to recovery in good condition.     Lenoard Aden, M.D.     RJT/MEDQ  D:  02/16/2017  T:  02/16/2017  Job:  [id number]  cc:   Lenoard Aden, M.D. Fax: [phone]

## 2017-02-16 NOTE — Op Note (Signed)
02/16/2017  10:58 AM  PATIENT:  Brooke [name]  31 y.o. female  PRE-OPERATIVE DIAGNOSIS:  Irregular Bleeding  POST-OPERATIVE DIAGNOSIS:  irregular bleeding  PROCEDURE:  Procedure(s): DILATATION & CURETTAGE HYSTEROSCOPY WITH MYOSURE RESECTION OF ENDOMETRIAL POLYP  SURGEON:  Surgeon(s): Olivia Mackie, MD  ASSISTANTS: none   ANESTHESIA:   local and general  ESTIMATED BLOOD LOSS: minimal, fluid deficit 350cc  DRAINS: none   LOCAL MEDICATIONS USED:  MARCAINE    and Amount: 20 ml  SPECIMEN:  Source of Specimen:  multiple endometrial polyps  DISPOSITION OF SPECIMEN:  PATHOLOGY  COUNTS:  YES  DICTATION #: [id number]  PLAN OF CARE: dc home  PATIENT DISPOSITION:  PACU - hemodynamically stable.

## 2017-02-16 NOTE — Transfer of Care (Signed)
Immediate Anesthesia Transfer of Care Note  Patient: Brooke [name]  Procedure(s) Performed: DILATATION & CURETTAGE/HYSTEROSCOPY WITH MYOSURE RESECTION OF ENDOMETRIAL POLYP (N/A Vagina )  Patient Location: PACU  Anesthesia Type:General  Level of Consciousness: awake, alert  and oriented  Airway & Oxygen Therapy: Patient Spontanous Breathing and Patient connected to nasal cannula oxygen  Post-op Assessment: Report given to RN and Post -op Vital signs reviewed and stable  Post vital signs: Reviewed and stable  Last Vitals:  Vitals:   02/16/17 0849  BP: (!) 153/93  Pulse: 85  Resp: 16  Temp: 37.2 C  SpO2: 99%    Last Pain:  Vitals:   02/16/17 0849  TempSrc: Oral      Patients Stated Pain Goal: 3 (02/16/17 0849)  Complications: No apparent anesthesia complications

## 2017-02-16 NOTE — Anesthesia Preprocedure Evaluation (Addendum)
Anesthesia Evaluation  Patient identified by MRN, date of birth, ID band Patient awake    Reviewed: Allergy & Precautions, H&P , NPO status , Patient's Chart, lab work & pertinent test results, reviewed documented beta blocker date and time   Airway Mallampati: II  TM Distance: >3 FB Neck ROM: full    Dental no notable dental hx.    Pulmonary neg pulmonary ROS,    Pulmonary exam normal breath sounds clear to auscultation       Cardiovascular Exercise Tolerance: Good hypertension, negative cardio ROS   Rhythm:regular Rate:Normal     Neuro/Psych negative neurological ROS  negative psych ROS   GI/Hepatic negative GI ROS, Neg liver ROS,   Endo/Other  negative endocrine ROS  Renal/GU negative Renal ROS  negative genitourinary   Musculoskeletal   Abdominal   Peds  Hematology negative hematology ROS (+)   Anesthesia Other Findings   Reproductive/Obstetrics negative OB ROS                             Anesthesia Physical Anesthesia Plan  ASA: III  Anesthesia Plan: General   Post-op Pain Management:    Induction: Intravenous  PONV Risk Score and Plan: 2 and Ondansetron and Dexamethasone  Airway Management Planned: LMA  Additional Equipment:   Intra-op Plan:   Post-operative Plan:   Informed Consent: I have reviewed the patients History and Physical, chart, labs and discussed the procedure including the risks, benefits and alternatives for the proposed anesthesia with the patient or authorized representative who has indicated his/her understanding and acceptance.   Dental Advisory Given  Plan Discussed with: CRNA and Surgeon  Anesthesia Plan Comments: ( )       Anesthesia Quick Evaluation

## 2017-02-16 NOTE — Progress Notes (Signed)
Patient ID: Brooke [name], female   DOB: Jun 29, 1985, 31 y.o.   MRN: [id number]  Patient seen and examined. Consent witnessed and signed. No changes noted. Update completed.

## 2017-02-16 NOTE — Anesthesia Procedure Notes (Signed)
Procedure Name: LMA Insertion Date/Time: 02/16/2017 10:35 AM Performed by: Cleda Clarks Pre-anesthesia Checklist: Patient identified, Emergency Drugs available, Suction available and Patient being monitored Patient Re-evaluated:Patient Re-evaluated prior to induction Oxygen Delivery Method: Circle system utilized Preoxygenation: Pre-oxygenation with 100% oxygen Induction Type: IV induction Ventilation: Mask ventilation without difficulty LMA: LMA inserted LMA Size: 4.0 Number of attempts: 1 Placement Confirmation: positive ETCO2 and breath sounds checked- equal and bilateral Tube secured with: Tape Dental Injury: Teeth and Oropharynx as per pre-operative assessment

## 2017-02-16 NOTE — Anesthesia Postprocedure Evaluation (Signed)
Anesthesia Post Note  Patient: Brooke [name]  Procedure(s) Performed: DILATATION & CURETTAGE/HYSTEROSCOPY WITH MYOSURE RESECTION OF ENDOMETRIAL POLYP (N/A Vagina )     Patient location during evaluation: PACU Anesthesia Type: General Level of consciousness: awake and alert Pain management: pain level controlled Vital Signs Assessment: post-procedure vital signs reviewed and stable Respiratory status: spontaneous breathing, nonlabored ventilation, respiratory function stable and patient connected to nasal cannula oxygen Cardiovascular status: blood pressure returned to baseline and stable Postop Assessment: no apparent nausea or vomiting Anesthetic complications: no    Last Vitals:  Vitals:   02/16/17 1148 02/16/17 1237  BP:  125/82  Pulse: 71 63  Resp: 14 16  Temp: 37.1 C   SpO2: 95% 99%    Last Pain:  Vitals:   02/16/17 0849  TempSrc: Oral   Pain Goal: Patients Stated Pain Goal: 3 (02/16/17 0849)               Jiles Garter

## 2017-02-17 ENCOUNTER — Encounter (HOSPITAL_COMMUNITY): Payer: Self-pay | Admitting: Obstetrics and Gynecology

## 2018-02-04 ENCOUNTER — Other Ambulatory Visit: Payer: Self-pay

## 2018-02-04 ENCOUNTER — Emergency Department: Payer: 59

## 2018-02-04 DIAGNOSIS — J45909 Unspecified asthma, uncomplicated: Secondary | ICD-10-CM | POA: Insufficient documentation

## 2018-02-04 DIAGNOSIS — F419 Anxiety disorder, unspecified: Secondary | ICD-10-CM | POA: Diagnosis not present

## 2018-02-04 DIAGNOSIS — I10 Essential (primary) hypertension: Secondary | ICD-10-CM | POA: Insufficient documentation

## 2018-02-04 DIAGNOSIS — B085 Enteroviral vesicular pharyngitis: Secondary | ICD-10-CM | POA: Diagnosis not present

## 2018-02-04 DIAGNOSIS — R509 Fever, unspecified: Secondary | ICD-10-CM | POA: Diagnosis present

## 2018-02-04 LAB — INFLUENZA PANEL BY PCR (TYPE A & B)
INFLAPCR: NEGATIVE
INFLBPCR: NEGATIVE

## 2018-02-04 MED ORDER — ACETAMINOPHEN 325 MG PO TABS
650.0000 mg | ORAL_TABLET | Freq: Once | ORAL | Status: AC | PRN
  Administered 2018-02-04: 650 mg via ORAL
  Filled 2018-02-04: qty 2

## 2018-02-04 NOTE — ED Notes (Signed)
Spoke with dr. Derrill Kay regarding pt's symptoms and presentation, verbal orders received for flu swab, chest xray.

## 2018-02-04 NOTE — ED Triage Notes (Signed)
Pt states son recently had strep. Pt with fever, body aches, sore throat for 2 days. Pt states has been taking tylenol at home last dose this am. Pt denies vomiting or diarrhea, but states she has had nausea.

## 2018-02-05 ENCOUNTER — Emergency Department
Admission: EM | Admit: 2018-02-05 | Discharge: 2018-02-05 | Disposition: A | Discharge status: Home / Self Care | Payer: 59 | Attending: Emergency Medicine | Admitting: Emergency Medicine

## 2018-02-05 DIAGNOSIS — J029 Acute pharyngitis, unspecified: Secondary | ICD-10-CM

## 2018-02-05 MED ORDER — MAGIC MOUTHWASH W/LIDOCAINE: 5.0000 mL | Freq: Three times a day (TID) | ORAL | 0 refills | Status: DC | PRN

## 2018-02-05 MED ORDER — ONDANSETRON 4 MG PO TBDP
4.0000 mg | ORAL_TABLET | Freq: Once | ORAL | Status: AC
  Administered 2018-02-05: 4 mg via ORAL
  Filled 2018-02-05: qty 1

## 2018-02-05 MED ORDER — ONDANSETRON 4 MG PO TBDP: 4.0000 mg | ORAL_TABLET | Freq: Three times a day (TID) | ORAL | 0 refills | Status: DC | PRN

## 2018-02-05 MED ORDER — GI COCKTAIL ~~LOC~~
30.0000 mL | Freq: Once | ORAL | Status: AC
  Administered 2018-02-05: 30 mL via ORAL
  Filled 2018-02-05: qty 30

## 2018-02-05 NOTE — ED Provider Notes (Signed)
[facility] Emergency Department Provider Note  Time seen: 2:41 AM  I have reviewed the triage vital signs and the nursing notes.   HISTORY  Chief Complaint Fever; Sore Throat; Chills; and Generalized Body Aches    HPI Brooke [name] is a 32 y.o. female with a past medical history of anxiety, gastric reflux, hypertension, presents to the emergency department for fever, chills, weakness sore throat and mouth.  According to the patient for the past several days she has been feeling more weak and fatigued, has had chills and fever at times.  States she has been experiencing a sore throat and mouth.  Denies any chest pain or abdominal pain.  States minimal cough.  No vomiting or diarrhea but does state nausea.  Past Medical History:  Diagnosis Date  . Alopecia    wears hair topper - ok to wear during 02/16/17 surgery per Dr Sherron Ales  . Anxiety    no meds  . 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  Marland Kitchen GERD (gastroesophageal reflux disease)   . Hypertension    Resolved -Hx with 2016 pregnancy only  . Migraines    last one last week 01/2017-  . PCOS (polycystic ovarian syndrome)   . Polycystic disease, ovaries   . SVD (spontaneous vaginal delivery) 2016   x 1    Patient Active Problem List   Diagnosis Date Noted  . Postpartum care following vaginal delivery (5/16) 09/23/2014  . [redacted] weeks gestation of pregnancy   . [redacted] weeks gestation of pregnancy   . [redacted] weeks gestation of pregnancy   . Non-reactive NST (non-stress test)   . Cervical insufficiency during pregnancy, antepartum   . [redacted] weeks gestation of pregnancy   . [redacted] weeks gestation of pregnancy   . Encounter for fetal anatomic survey   . Obesity affecting pregnancy, antepartum   . [redacted] weeks gestation of pregnancy   . Cervical insufficiency during pregnancy in second trimester, antepartum   . Amniotic fluid leaking   .  Abdominal pain in pregnancy   . [redacted] weeks gestation of pregnancy   . Cervical incompetence during pregnancy in second trimester 07/10/2014  . [redacted] weeks gestation of pregnancy   . Cervical cerclage suture present in second trimester   . Pregnancy resulting from assisted reproductive technology   . Cervical incompetence 06/26/2014  . 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 SINUSITIS, ACUTE 06/04/2007  . COUGH 05/09/2007  . HYPERTENSION 05/08/2007  . ALLERGIC RHINITIS 05/08/2007  . ASTHMA 05/08/2007  . G E R D 05/08/2007    Past Surgical History:  Procedure Laterality Date  . CERVICAL CERCLAGE N/A 06/26/2014   Procedure: CERCLAGE CERVICAL;  Surgeon: Lenoard Aden, MD;  Location: [facility];  Service: Gynecology;  Laterality: N/A;  . DILATATION & CURETTAGE/HYSTEROSCOPY WITH MYOSURE N/A 02/16/2017   Procedure: DILATATION & CURETTAGE/HYSTEROSCOPY WITH MYOSURE RESECTION OF ENDOMETRIAL POLYP;  Surgeon: Olivia Mackie, MD;  Location: [facility];  Service: Gynecology;  Laterality: N/A;  . TONSILLECTOMY    . tubes in ears     as child  . WISDOM TOOTH EXTRACTION      Prior to Admission medications   Medication Sig Start Date End Date Taking? Authorizing Provider  ibuprofen (ADVIL,MOTRIN) 200 MG tablet Take 800 mg by mouth at bedtime as needed for mild pain.    [provider]  meloxicam (MOBIC) 15  MG tablet Take 15 mg by mouth daily as needed for pain.    [provider]  pantoprazole (PROTONIX) 20 MG tablet Take 20 mg by mouth as needed.    [provider]  traMADol (ULTRAM) 50 MG tablet Take 1-2 tablets (50-100 mg total) by mouth every 6 (six) hours as needed. 02/16/17   Olivia Mackie, MD    No Known Allergies  Family History  Problem Relation Age of Onset  . Diabetes Mother   . Hypertension Mother   . Hypertension Father   . Alcohol abuse Maternal Grandmother   . Hearing loss Paternal Grandfather     Social  History Social History   Tobacco Use  . Smoking status: Never Smoker  . Smokeless tobacco: Never Used  Substance Use Topics  . Alcohol use: No  . Drug use: No    Review of Systems Constitutional: Positive for fever ENT: Sores in throat and mouth Cardiovascular: Negative for chest pain. Respiratory: Negative for shortness of breath.  Occasional cough. Gastrointestinal: Negative for abdominal pain.  Positive for nausea. Genitourinary: Negative for urinary compaints Musculoskeletal: Negative for musculoskeletal complaints Skin: Negative for skin complaints  Neurological: Negative for headache All other ROS negative  ____________________________________________   PHYSICAL EXAM:  VITAL SIGNS: ED Triage Vitals [02/04/18 2303]  Enc Vitals Group     BP (!) 151/100     Pulse Rate (!) 110     Resp 18     Temp (!) 101.5 F (38.6 C)     Temp Source Oral     SpO2 100 %     Weight 219 lb (99.3 kg)     Height 5\' 5"  (1.651 m)     Head Circumference      Peak Flow      Pain Score 10     Pain Loc      Pain Edu?      Excl. in GC?    Constitutional: Alert and oriented. Well appearing and in no distress. Eyes: Normal exam ENT   Head: Normocephalic and atraumatic.   Mouth/Throat: Mucous membranes are moist.  Patient has ulcerations on both sides of her tongue as well as soft palate Cardiovascular: Normal rate, regular rhythm. No murmur Respiratory: Normal respiratory effort without tachypnea nor retractions. Breath sounds are clear  Gastrointestinal: Soft and nontender. No distention. Musculoskeletal: Nontender with normal range of motion in all extremities.  Neurologic:  Normal speech and language. No gross focal neurologic deficits  Skin:  Skin is warm, dry and intact.  Psychiatric: Mood and affect are normal.   ____________________________________________   RADIOLOGY  Chest x-ray negative  ____________________________________________   INITIAL IMPRESSION /  ASSESSMENT AND PLAN / ED COURSE  Pertinent labs & imaging results that were available during my care of the patient were reviewed by me and considered in my medical decision making (see chart for details).  Patient presents emergency department for fever, weakness chills sore throat and mouth.  Examination shows ulcerations on both sides of tongue and over soft palate, no significant tonsillar hypertrophy or exudate.  Chest x-ray is negative influenza test is negative.  Examination most consistent with coxsackievirus/herpangina.  I discussed with the patient the use of Magic mouthwash, Tylenol/ibuprofen every 6 hours as needed for fever/discomfort and following up with her doctor this week for recheck.  Patient agreeable to plan of care.  ____________________________________________   FINAL CLINICAL IMPRESSION(S) / ED DIAGNOSES  Janit Bern, MD 02/05/18 [phone]

## 2019-10-24 DIAGNOSIS — G4733 Obstructive sleep apnea (adult) (pediatric): Secondary | ICD-10-CM | POA: Insufficient documentation

## 2020-02-03 HISTORY — PX: GASTRIC BYPASS: SHX52

## 2020-02-21 ENCOUNTER — Ambulatory Visit: Payer: Self-pay

## 2020-02-24 ENCOUNTER — Ambulatory Visit (INDEPENDENT_AMBULATORY_CARE_PROVIDER_SITE_OTHER): Payer: 59 | Admitting: Internal Medicine

## 2020-02-24 VITALS — BP 127/94 | HR 80 | Ht 64.0 in | Wt 225.0 lb

## 2020-02-24 DIAGNOSIS — Z9989 Dependence on other enabling machines and devices: Secondary | ICD-10-CM | POA: Diagnosis not present

## 2020-02-24 DIAGNOSIS — G4733 Obstructive sleep apnea (adult) (pediatric): Secondary | ICD-10-CM | POA: Diagnosis not present

## 2020-02-24 DIAGNOSIS — K219 Gastro-esophageal reflux disease without esophagitis: Secondary | ICD-10-CM

## 2020-02-24 DIAGNOSIS — Z7189 Other specified counseling: Secondary | ICD-10-CM | POA: Diagnosis not present

## 2020-02-24 NOTE — Patient Instructions (Signed)

## 2020-02-24 NOTE — Progress Notes (Signed)
[facility] [address] [place], [place] [address]  Pulmonary Sleep Medicine   Office Visit Note  Patient Name: Brooke [name] DOB: Aug 20, 1985 MRN [id number]    Chief Complaint: Obstructive Sleep Apnea visit  Brief History:  Brooke [name] is seen today for follow up The patient has a 7 year history of sleep apnea. Patient is not using PAP nightly.  She has given up on  CPAP because it is drying her out. She reports that since losing weight she no longer snores and is sleeping soundly. She no longer even takes melatonin for sleep.Her husband does not hear snoring. She denies sleepiness and the Epworth Sleepiness Score is 4 out of 24. The patient does not take naps. The compliance download shows poor compliance with an average use time of 4.5 hours. The AHI is 2.7  The patient does not complain of limb movements disrupting sleep. She had gastric bypass and is down 28 lbs from her last visit ROS  General: (-) fever, (-) chills, (-) night sweat Nose and Sinuses: (-) nasal stuffiness or itchiness, (-) postnasal drip, (-) nosebleeds, (-) sinus trouble. Mouth and Throat: (-) sore throat, (-) hoarseness. Neck: (-) swollen glands, (-) enlarged thyroid, (-) neck pain. Respiratory: - cough, - shortness of breath, - wheezing. Neurologic: - numbness, - tingling. Psychiatric: - anxiety, - depression   Current Medication: Outpatient Encounter Medications as of 02/24/2020  Medication Sig  . pantoprazole (PROTONIX) 20 MG tablet Take 20 mg by mouth as needed.  . [DISCONTINUED] ibuprofen (ADVIL,MOTRIN) 200 MG tablet Take 800 mg by mouth at bedtime as needed for mild pain.  . [DISCONTINUED] magic mouthwash w/lidocaine SOLN Take 5 mLs by mouth 3 (three) times daily as needed for mouth pain.  . [DISCONTINUED] meloxicam (MOBIC) 15 MG tablet Take 15 mg by mouth daily as needed for pain.  . [DISCONTINUED] ondansetron (ZOFRAN ODT) 4 MG disintegrating tablet Take 1 tablet (4 mg total) by  mouth every 8 (eight) hours as needed for nausea or vomiting.  . [DISCONTINUED] traMADol (ULTRAM) 50 MG tablet Take 1-2 tablets (50-100 mg total) by mouth every 6 (six) hours as needed.   No facility-administered encounter medications on file as of 02/24/2020.    Surgical History: Past Surgical History:  Procedure Laterality Date  . CERVICAL CERCLAGE N/A 06/26/2014   Procedure: CERCLAGE CERVICAL;  Surgeon: Lenoard Aden, MD;  Location: [facility];  Service: Gynecology;  Laterality: N/A;  . DILATATION & CURETTAGE/HYSTEROSCOPY WITH MYOSURE N/A 02/16/2017   Procedure: DILATATION & CURETTAGE/HYSTEROSCOPY WITH MYOSURE RESECTION OF ENDOMETRIAL POLYP;  Surgeon: Olivia Mackie, MD;  Location: [facility];  Service: Gynecology;  Laterality: N/A;  . 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 TONSILLECTOMY    . tubes in ears     as child  . WISDOM TOOTH EXTRACTION      Medical History: Past Medical History:  Diagnosis Date  . Alopecia    wears hair topper - ok to wear during 02/16/17 surgery per Dr Sherron Ales  . Anxiety    no meds  . 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  Marland Kitchen GERD (gastroesophageal reflux disease)   . Hypertension    Resolved -Hx with 2016 pregnancy only  . Migraines    last one last week 01/2017-  . PCOS (polycystic ovarian syndrome)   . Polycystic disease, ovaries   . SVD (spontaneous vaginal delivery) 2016   x 1  Family History: Non contributory to the present illness  Social History: Social History   Socioeconomic History  . Marital status: Married    Spouse name: Not on file  . Number of children: Not on file  . Years of education: Not on file  . Highest education level: Not on file  Occupational History  . Not on file  Tobacco Use  . Smoking status: Never Smoker  . Smokeless tobacco: Never Used  Vaping Use  . Vaping Use: Never used  Substance and Sexual Activity  . Alcohol use: No  .  Drug use: No  . Sexual activity: Yes    Partners: Male    Birth control/protection: None  Other Topics Concern  . Not on file  Social History Narrative   Married: no kids   Regular exercise: not at this time   Caffeine use: no   Social Determinants of Corporate investment banker Strain:   . Difficulty of Paying Living Expenses: Not on file  Food Insecurity:   . Worried About Programme researcher, broadcasting/film/video in the Last Year: Not on file  . Ran Out of Food in the Last Year: Not on file  Transportation Needs:   . Lack of Transportation (Medical): Not on file  . Lack of Transportation (Non-Medical): Not on file  Physical Activity:   . Days of Exercise per Week: Not on file  . Minutes of Exercise per Session: Not on file  Stress:   . Feeling of Stress : Not on file  Social Connections:   . Frequency of Communication with Friends and Family: Not on file  . Frequency of Social Gatherings with Friends and Family: Not on file  . Attends Religious Services: Not on file  . Active Member of Clubs or Organizations: Not on file  . Attends Banker Meetings: Not on file  . Marital Status: Not on file  Intimate Partner Violence:   . Fear of Current or Ex-Partner: Not on file  . Emotionally Abused: Not on file  . Physically Abused: Not on file  . Sexually Abused: Not on file    Vital Signs: Blood pressure (!) 127/94, pulse 80, height 5\' 4"  (1.626 m), weight 225 lb (102.1 kg), SpO2 98 %.  Examination: General Appearance: The patient is well-developed, well-nourished, and in no distress. Neck Circumference: 43 Skin: Gross inspection of skin unremarkable. Head: normocephalic, no gross deformities. Eyes: no gross deformities noted. ENT: ears appear grossly normal Neurologic: Alert and oriented. No involuntary movements.    EPWORTH SLEEPINESS SCALE:  Scale:  (0)= no chance of dozing; (1)= slight chance of dozing; (2)= moderate chance of dozing; (3)= high chance of dozing  Chance   Situtation    Sitting and reading: 1    Watching TV: 1    Sitting Inactive in public: 0    As a passenger in car: 1      Lying down to rest: 1    Sitting and talking: 0    Sitting quielty after lunch: 1    In a car, stopped in traffic: 0   TOTAL SCORE:   4 out of 24    SLEEP STUDIES:  1. HST 07/10/19 REI 11 SpO42min 84%   CPAP COMPLIANCE DATA:  Date Range: 10/07/19-02/03/20  Average Daily Use: 4.5 hours  Median Use: 4.3  Compliance for > 4 Hours: 30%  AHI: 2.7 respiratory events per hour  Days Used: 68/120  Mask Leak: 7.6  95th Percentile Pressure: 10.2  LABS:  No results found for this or any previous visit (from the past 2160 hour(s)).  Radiology: No results found.        Assessment and Plan: Patient Active Problem List   Diagnosis Date Noted  . Postpartum care following vaginal delivery (5/16) 09/23/2014  . [redacted] weeks gestation of pregnancy   . [redacted] weeks gestation of pregnancy   . [redacted] weeks gestation of pregnancy   . Non-reactive NST (non-stress test)   . Cervical insufficiency during pregnancy, antepartum   . [redacted] weeks gestation of pregnancy   . [redacted] weeks gestation of pregnancy   . Encounter for fetal anatomic survey   . Obesity affecting pregnancy, antepartum   . [redacted] weeks gestation of pregnancy   . Cervical insufficiency during pregnancy in second trimester, antepartum   . Amniotic fluid leaking   . Abdominal pain in pregnancy   . [redacted] weeks gestation of pregnancy   . Cervical incompetence during pregnancy in second trimester 07/10/2014  . [redacted] weeks gestation of pregnancy   . Cervical cerclage suture present in second trimester   . Pregnancy resulting from assisted reproductive technology   . Cervical incompetence 06/26/2014  . 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 SINUSITIS, ACUTE 06/04/2007  . COUGH 05/09/2007  . HYPERTENSION 05/08/2007  . ALLERGIC RHINITIS 05/08/2007  . ASTHMA 05/08/2007  . G E  R D 05/08/2007      The patient stopped using the CPAP following weight loss after gastric bypass surgery. She denies snoring or any sleepiness now. She is off all of her medications as well . A repeat study is recommended to ensure that the apnea has been alleviated.    1. OSA- will need repeat study to assess for improvement in AHI--may no longer require CPAP due to weight loss from gastric bypass. 2. CPAP couseling-Discussed importance of adequate CPAP use as well as proper care and cleaning techniques of machine and all supplies. 3. GERD: Symptoms well controlled at this time, continue with current therapy and routine montioring  General Counseling: I have discussed the findings of the evaluation and examination with [facility].  I have also discussed any further diagnostic evaluation thatmay be needed or ordered today. Neidra verbalizes understanding of the findings of todays visit. We also reviewed her medications today and discussed drug interactions and side effects including but not limited excessive drowsiness and altered mental states. We also discussed that there is always a risk not just to her but also people around her. she has been encouraged to call the office with any questions or concerns that should arise related to todays visit.   I have personally obtained a history, examined the patient, evaluated laboratory and imaging results, formulated the assessment and plan and placed orders.  This patient was seen by Leeanne Deed AGNP-C in Collaboration with Dr. Freda Munro as a part of collaborative care agreement.  Valentino Hue Sol Blazing, PhD, FAASM  Diplomate, American Board of Sleep Medicine    Yevonne Pax, MD [facility] Diplomate ABMS Pulmonary and Critical Care Medicine Sleep medicine

## 2020-03-23 ENCOUNTER — Ambulatory Visit (INDEPENDENT_AMBULATORY_CARE_PROVIDER_SITE_OTHER): Payer: 59 | Admitting: Internal Medicine

## 2020-03-23 VITALS — Ht 64.0 in | Wt 208.0 lb

## 2020-03-23 DIAGNOSIS — I10 Essential (primary) hypertension: Secondary | ICD-10-CM

## 2020-03-23 DIAGNOSIS — G4733 Obstructive sleep apnea (adult) (pediatric): Secondary | ICD-10-CM | POA: Diagnosis not present

## 2020-03-23 DIAGNOSIS — K219 Gastro-esophageal reflux disease without esophagitis: Secondary | ICD-10-CM | POA: Diagnosis not present

## 2020-03-23 NOTE — Progress Notes (Signed)
Sleep Medicine   Office Visit  Patient Name: Brooke [name] DOB: 1986-03-27 MRN [id number]  I connected with  Brooke [name] on 03/23/20 by a video enabled telemedicine application and verified that I am speaking with the correct person using two identifiers.   I discussed the limitations of evaluation and management by telemedicine. The patient expressed understanding and agreed to proceed.    Chief Complaint: study results/sleep apnea   HISTORY OF PRESENT ILLNESS: Brooke [name] is seen today for follow up to discuss the results of her recent repeat sleep study demonstrating only borderline sleep apnea. She has not been using her CPAP since her gastric bypass surgery and states that she sleeps well. She denies daytime sleepiness and notes that she is no longer taking any medications with the exception of Protonix.   ROS  General: (-) fever, (-) chills, (-) night sweat Nose and Sinuses: (-) nasal stuffiness or itchiness, (-) postnasal drip, (-) nosebleeds, (-) sinus trouble. Mouth and Throat: (-) sore throat, (-) hoarseness. Neck: (-) swollen glands, (-) enlarged thyroid, (-) neck pain. Respiratory: - cough, - shortness of breath, - wheezing. Neurologic: - numbness, - tingling. Psychiatric: - anxiety, - depression    Current Medication: Outpatient Encounter Medications as of 03/23/2020  Medication Sig   pantoprazole (PROTONIX) 20 MG tablet Take 20 mg by mouth as needed.   No facility-administered encounter medications on file as of 03/23/2020.    Surgical History: Past Surgical History:  Procedure Laterality Date   CERVICAL CERCLAGE N/A 06/26/2014   Procedure: CERCLAGE CERVICAL;  Surgeon: Lenoard Aden, MD;  Location: [facility];  Service: Gynecology;  Laterality: N/A;   DILATATION & CURETTAGE/HYSTEROSCOPY WITH MYOSURE N/A 02/16/2017   Procedure: DILATATION & CURETTAGE/HYSTEROSCOPY WITH MYOSURE RESECTION OF ENDOMETRIAL POLYP;  Surgeon: Olivia Mackie, MD;   Location: [facility];  Service: Gynecology;  Laterality: N/A;   GASTRIC BYPASS  02/03/2020   TONSILLECTOMY     tubes in ears     as child   WISDOM TOOTH EXTRACTION      Medical History: Past Medical History:  Diagnosis Date   Alopecia    wears hair topper - ok to wear during 02/16/17 surgery per Dr Sherron Ales   Anxiety    no meds   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   GERD (gastroesophageal reflux disease)    Hypertension    Resolved -Hx with 2016 pregnancy only   Migraines    last one last week 01/2017-   PCOS (polycystic ovarian syndrome)    Polycystic disease, ovaries    SVD (spontaneous vaginal delivery) 2016   x 1    Family History: Non contributory to the present illness  Social History: Social History   Socioeconomic History   Marital status: Married    Spouse name: Not on file   Number of children: Not on file   Years of education: Not on file   Highest education level: Not on file  Occupational History   Not on file  Tobacco Use   Smoking status: Never Smoker   Smokeless tobacco: Never Used  Vaping Use   Vaping Use: Never used  Substance and Sexual Activity   Alcohol use: No   Drug use: No   Sexual activity: Yes    Partners: Male    Birth control/protection: None  Other Topics Concern   Not on file  Social History Narrative   Married: no  kids   Regular exercise: not at this time   Caffeine use: no   Social Determinants of Corporate investment banker Strain:    Difficulty of Paying Living Expenses: Not on file  Food Insecurity:    Worried About Programme researcher, broadcasting/film/video in the Last Year: Not on file   [employer] of Food in the Last Year: Not on file  Transportation Needs:    Lack of Transportation (Medical): Not on file   Lack of Transportation (Non-Medical): Not on file  Physical Activity:    Days of Exercise per Week: Not on file    Minutes of Exercise per Session: Not on file  Stress:    Feeling of Stress : Not on file  Social Connections:    Frequency of Communication with Friends and Family: Not on file   Frequency of Social Gatherings with Friends and Family: Not on file   Attends Religious Services: Not on file   Active Member of Clubs or Organizations: Not on file   Attends Banker Meetings: Not on file   Marital Status: Not on file  Intimate Partner Violence:    Fear of Current or Ex-Partner: Not on file   Emotionally Abused: Not on file   Physically Abused: Not on file   Sexually Abused: Not on file    Vital Signs: There were no vitals taken for this visit.  Examination: General Appearance: The patient is well-developed, well-nourished, and in no distress. Neck Circumference: n/a Skin: Gross inspection of skin unremarkable. Head: normocephalic, no gross deformities. Eyes: no gross deformities noted. ENT: ears appear grossly normal Neurologic: Alert and oriented. No involuntary movements.      SLEEP STUDIES:  1. HST on 07/10/19 -RI  10.6,  Low SAT 84%   LABS: No results found for this or any previous visit (from the past 2160 hour(s)).  Radiology: Patient was never admitted.  No results found.  No results found.    Assessment and Plan: Patient Active Problem List   Diagnosis Date Noted   Postpartum care following vaginal delivery (5/16) 09/23/2014   [redacted] weeks gestation of pregnancy    [redacted] weeks gestation of pregnancy    [redacted] weeks gestation of pregnancy    Non-reactive NST (non-stress test)    Cervical insufficiency during pregnancy, antepartum    [redacted] weeks gestation of pregnancy    [redacted] weeks gestation of pregnancy    Encounter for fetal anatomic survey    Obesity affecting pregnancy, antepartum    [redacted] weeks gestation of pregnancy    Cervical insufficiency during pregnancy in second trimester, antepartum    Amniotic fluid leaking    Abdominal  pain in pregnancy    [redacted] weeks gestation of pregnancy    Cervical incompetence during pregnancy in second trimester 07/10/2014   [redacted] weeks gestation of pregnancy    Cervical cerclage suture present in second trimester    Pregnancy resulting from assisted reproductive technology    Cervical incompetence 06/26/2014   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 Her apnea is borderline. She denies any further issues with snoring, sleep disruption or sleepiness since losing weight. She continues to lose weight following the gastric bypass surgery. IN this light  it is reasonable for her to discontinue using CPAP.  She was advised to remain vigilant for any return of symptoms of sleep apnea.   1.  OSA- resolved following gastric bypass surgery.  Can continue without CPAP machine. Pt to f/u with our office if s/s of sleep apnea return.  2.    Obesity - has lost 45 lbs since having bypass surgery Sept 27, 2021. Discussed the importance of  continuing with weight management through healthy eating and daily exercise as tolerated.  3.    GERD -  Controlled on protonix daily - followed by PCP.  4.    HTN - not taking any HTN medications. States BP has been controlled since surgery and subsequent weight loss. Followed by PCP.   General Counseling: I have discussed the findings of the evaluation and examination with Brooke [name].  I have also discussed any further diagnostic evaluation thatmay be needed or ordered today. Brooke [name] verbalizes understanding of the findings of todays visit. We also reviewed her medications today and discussed drug interactions and side effects including but not limited excessive drowsiness and altered mental  states. We also discussed that there is always a risk not just to her but also people around her. she has been encouraged to call the office with any questions or concerns that should arise related to todays visit.   This patient was seen by Layla Barter, AGNP-C in collaboration with Dr. Freda Munro as a part of collaborative care agreement.       I have personally obtained a history, evaluated the patient, evaluated pertinent data, formulated the assessment and plan and placed orders.   Valentino Hue Sol Blazing, PhD, FAASM  Diplomate, American Board of Sleep Medicine    Yevonne Pax, MD [facility] Diplomate ABMS Pulmonary and Critical Care Medicine Sleep medicine

## 2020-03-23 NOTE — Patient Instructions (Signed)
Sleep Apnea Sleep apnea affects breathing during sleep. It causes breathing to stop for a short time or to become shallow. It can also increase the risk of:  Heart attack.  Stroke.  Being very overweight (obese).  Diabetes.  Heart failure.  Irregular heartbeat. The goal of treatment is to help you breathe normally again. What are the causes? There are three kinds of sleep apnea:  Obstructive sleep apnea. This is caused by a blocked or collapsed airway.  Central sleep apnea. This happens when the brain does not send the right signals to the muscles that control breathing.  Mixed sleep apnea. This is a combination of obstructive and central sleep apnea. The most common cause of this condition is a collapsed or blocked airway. This can happen if:  Your throat muscles are too relaxed.  Your tongue and tonsils are too large.  You are overweight.  Your airway is too small. What increases the risk?  Being overweight.  Smoking.  Having a small airway.  Being older.  Being female.  Drinking alcohol.  Taking medicines to calm yourself (sedatives or tranquilizers).  Having family members with the condition. What are the signs or symptoms?  Trouble staying asleep.  Being sleepy or tired during the day.  Getting angry a lot.  Loud snoring.  Headaches in the morning.  Not being able to focus your mind (concentrate).  Forgetting things.  Less interest in sex.  Mood swings.  Personality changes.  Feelings of sadness (depression).  Waking up a lot during the night to pee (urinate).  Dry mouth.  Sore throat. How is this diagnosed?  Your medical history.  A physical exam.  A test that is done when you are sleeping (sleep study). The test is most often done in a sleep lab but may also be done at home. How is this treated?   Sleeping on your side.  Using a medicine to get rid of mucus in your nose (decongestant).  Avoiding the use of alcohol,  medicines to help you relax, or certain pain medicines (narcotics).  Losing weight, if needed.  Changing your diet.  Not smoking.  Using a machine to open your airway while you sleep, such as: ? An oral appliance. This is a mouthpiece that shifts your lower jaw forward. ? A CPAP device. This device blows air through a mask when you breathe out (exhale). ? An EPAP device. This has valves that you put in each nostril. ? A BPAP device. This device blows air through a mask when you breathe in (inhale) and breathe out.  Having surgery if other treatments do not work. It is important to get treatment for sleep apnea. Without treatment, it can lead to:  High blood pressure.  Coronary artery disease.  In men, not being able to have an erection (impotence).  Reduced thinking ability. Follow these instructions at home: Lifestyle  Make changes that your doctor recommends.  Eat a healthy diet.  Lose weight if needed.  Avoid alcohol, medicines to help you relax, and some pain medicines.  Do not use any products that contain nicotine or tobacco, such as cigarettes, e-cigarettes, and chewing tobacco. If you need help quitting, ask your doctor. General instructions  Take over-the-counter and prescription medicines only as told by your doctor.  If you were given a machine to use while you sleep, use it only as told by your doctor.  If you are having surgery, make sure to tell your doctor you have sleep apnea. You   may need to bring your device with you.  Keep all follow-up visits as told by your doctor. This is important. Contact a doctor if:  The machine that you were given to use during sleep bothers you or does not seem to be working.  You do not get better.  You get worse. Get help right away if:  Your chest hurts.  You have trouble breathing in enough air.  You have an uncomfortable feeling in your back, arms, or stomach.  You have trouble talking.  One side of your  body feels weak.  A part of your face is hanging down. These symptoms may be an emergency. Do not wait to see if the symptoms will go away. Get medical help right away. Call your local emergency services (911 in the [place] Do not drive yourself to the hospital. Summary  This condition affects breathing during sleep.  The most common cause is a collapsed or blocked airway.  The goal of treatment is to help you breathe normally while you sleep. This information is not intended to replace advice given to you by your health care provider. Make sure you discuss any questions you have with your health care provider. Document Revised: 02/09/2018 Document Reviewed: 12/19/2017 Elsevier Patient Education  2020 Elsevier Inc.   Obesity, Adult Obesity is having too much body fat. Being obese means that your weight is more than what is healthy for you. BMI is a number that explains how much body fat you have. If you have a BMI of 30 or more, you are obese. Obesity is often caused by eating or drinking more calories than your body uses. Changing your lifestyle can help you lose weight. Obesity can cause serious health problems, such as:  Stroke.  Coronary artery disease (CAD).  Type 2 diabetes.  Some types of cancer, including cancers of the colon, breast, uterus, and gallbladder.  Osteoarthritis.  High blood pressure (hypertension).  High cholesterol.  Sleep apnea.  Gallbladder stones.  Infertility problems. What are the causes?  Eating meals each day that are high in calories, sugar, and fat.  Being born with genes that may make you more likely to become obese.  Having a medical condition that causes obesity.  Taking certain medicines.  Sitting a lot (having a sedentary lifestyle).  Not getting enough sleep.  Drinking a lot of drinks that have sugar in them. What increases the risk?  Having a family history of obesity.  Being an [place] American woman.  Being a  Hispanic man.  Living in an area with limited access to: ? [place], recreation centers, or sidewalks. ? Healthy food choices, such as grocery stores and farmers' markets. What are the signs or symptoms? The main sign is having too much body fat. How is this treated?  Treatment for this condition often includes changing your lifestyle. Treatment may include: ? Changing your diet. This may include making a healthy meal plan. ? Exercise. This may include activity that causes your heart to beat faster (aerobic exercise) and strength training. Work with your doctor to design a program that works for you. ? Medicine to help you lose weight. This may be used if you are not able to lose 1 pound a week after 6 weeks of healthy eating and more exercise. ? Treating conditions that cause the obesity. ? Surgery. Options may include gastric banding and gastric bypass. This may be done if:  Other treatments have not helped to improve your condition.  You have a  BMI of 40 or higher.  You have life-threatening health problems related to obesity. Follow these instructions at home: Eating and drinking   Follow advice from your doctor about what to eat and drink. Your doctor may tell you to: ? Limit fast food, sweets, and processed snack foods. ? Choose low-fat options. For example, choose low-fat milk instead of whole milk. ? Eat 5 or more servings of fruits or vegetables each day. ? Eat at home more often. This gives you more control over what you eat. ? Choose healthy foods when you eat out. ? Learn to read food labels. This will help you learn how much food is in 1 serving. ? Keep low-fat snacks available. ? Avoid drinks that have a lot of sugar in them. These include soda, fruit juice, iced tea with sugar, and flavored milk.  Drink enough water to keep your pee (urine) pale yellow.  Do not go on fad diets. Physical activity  Exercise often, as told by your doctor. Most adults should get up to  150 minutes of moderate-intensity exercise every week.Ask your doctor: ? What types of exercise are safe for you. ? How often you should exercise.  Warm up and stretch before being active.  Do slow stretching after being active (cool down).  Rest between times of being active. Lifestyle  Work with your doctor and a food expert (dietitian) to set a weight-loss goal that is best for you.  Limit your screen time.  Find ways to reward yourself that do not involve food.  Do not drink alcohol if: ? Your doctor tells you not to drink. ? You are pregnant, may be pregnant, or are planning to become pregnant.  If you drink alcohol: ? Limit how much you use to:  0-1 drink a day for women.  0-2 drinks a day for men. ? Be aware of how much alcohol is in your drink. In the [place] one drink equals one 12 oz bottle of beer (355 mL), one 5 oz glass of wine (148 mL), or one 1 oz glass of hard liquor (44 mL). General instructions  Keep a weight-loss journal. This can help you keep track of: ? The food that you eat. ? How much exercise you get.  Take over-the-counter and prescription medicines only as told by your doctor.  Take vitamins and supplements only as told by your doctor.  Think about joining a support group.  Keep all follow-up visits as told by your doctor. This is important. Contact a doctor if:  You cannot meet your weight loss goal after you have changed your diet and lifestyle for 6 weeks. Get help right away if you:  Are having trouble breathing.  Are having thoughts of harming yourself. Summary  Obesity is having too much body fat.  Being obese means that your weight is more than what is healthy for you.  Work with your doctor to set a weight-loss goal.  Get regular exercise as told by your doctor. This information is not intended to replace advice given to you by your health care provider. Make sure you discuss any questions you have with your health care  provider. Document Revised: 12/28/2017 Document Reviewed: 12/28/2017 Elsevier Patient Education  2020 [employer].

## 2020-05-13 ENCOUNTER — Other Ambulatory Visit: Payer: Self-pay

## 2020-05-13 ENCOUNTER — Encounter (HOSPITAL_COMMUNITY): Payer: Self-pay | Admitting: Obstetrics and Gynecology

## 2020-05-13 ENCOUNTER — Inpatient Hospital Stay (HOSPITAL_COMMUNITY)
Admission: AD | Admit: 2020-05-13 | Discharge: 2020-05-13 | Discharge status: Against Medical Advice | Payer: 59 | Attending: Obstetrics and Gynecology | Admitting: Obstetrics and Gynecology

## 2020-05-13 LAB — URINALYSIS, ROUTINE W REFLEX MICROSCOPIC
Bilirubin Urine: NEGATIVE
Glucose, UA: NEGATIVE mg/dL
Ketones, ur: 80 mg/dL — AB
Nitrite: NEGATIVE
Protein, ur: NEGATIVE mg/dL
Specific Gravity, Urine: 1.025 (ref 1.005–1.030)
pH: 5 (ref 5.0–8.0)

## 2020-05-13 NOTE — MAU Note (Signed)
Pt went to front desk and told personal she is going to leave. She is epileptic and needs to get some sleep so she does not have a seizure. Pt signed AMA form and left.

## 2020-05-13 NOTE — MAU Note (Signed)
Having cramping in upper abd for hour and half. Vag bleeding started ago. Bright bleeding. Had gastric bypass in Sept and had a blockage with that. Thought that may be what was going on but then started having vag bleeding. LMP 03/22/20. Had BHCG on Monday 435 and today 728.

## 2020-05-14 ENCOUNTER — Inpatient Hospital Stay (HOSPITAL_COMMUNITY): Payer: 59

## 2020-05-14 ENCOUNTER — Encounter (HOSPITAL_COMMUNITY): Payer: Self-pay | Admitting: Obstetrics and Gynecology

## 2020-05-14 ENCOUNTER — Inpatient Hospital Stay (HOSPITAL_COMMUNITY)
Admission: AD | Admit: 2020-05-14 | Discharge: 2020-05-14 | Disposition: A | Discharge status: Home / Self Care | Payer: 59 | Attending: Obstetrics and Gynecology | Admitting: Obstetrics and Gynecology

## 2020-05-14 DIAGNOSIS — Z9884 Bariatric surgery status: Secondary | ICD-10-CM | POA: Insufficient documentation

## 2020-05-14 DIAGNOSIS — O3481 Maternal care for other abnormalities of pelvic organs, first trimester: Secondary | ICD-10-CM | POA: Insufficient documentation

## 2020-05-14 DIAGNOSIS — D709 Neutropenia, unspecified: Secondary | ICD-10-CM

## 2020-05-14 DIAGNOSIS — N83202 Unspecified ovarian cyst, left side: Secondary | ICD-10-CM

## 2020-05-14 DIAGNOSIS — Z6790 Unspecified blood type, Rh positive: Secondary | ICD-10-CM

## 2020-05-14 DIAGNOSIS — Z3A01 Less than 8 weeks gestation of pregnancy: Secondary | ICD-10-CM

## 2020-05-14 DIAGNOSIS — N83201 Unspecified ovarian cyst, right side: Secondary | ICD-10-CM | POA: Diagnosis not present

## 2020-05-14 DIAGNOSIS — D696 Thrombocytopenia, unspecified: Secondary | ICD-10-CM | POA: Diagnosis not present

## 2020-05-14 DIAGNOSIS — R7401 Elevation of levels of liver transaminase levels: Secondary | ICD-10-CM

## 2020-05-14 DIAGNOSIS — O3680X0 Pregnancy with inconclusive fetal viability, not applicable or unspecified: Secondary | ICD-10-CM | POA: Diagnosis not present

## 2020-05-14 DIAGNOSIS — O99111 Other diseases of the blood and blood-forming organs and certain disorders involving the immune mechanism complicating pregnancy, first trimester: Secondary | ICD-10-CM | POA: Diagnosis not present

## 2020-05-14 DIAGNOSIS — O4690 Antepartum hemorrhage, unspecified, unspecified trimester: Secondary | ICD-10-CM

## 2020-05-14 DIAGNOSIS — O99891 Other specified diseases and conditions complicating pregnancy: Secondary | ICD-10-CM

## 2020-05-14 DIAGNOSIS — O209 Hemorrhage in early pregnancy, unspecified: Secondary | ICD-10-CM | POA: Diagnosis not present

## 2020-05-14 DIAGNOSIS — Z20822 Contact with and (suspected) exposure to covid-19: Secondary | ICD-10-CM | POA: Insufficient documentation

## 2020-05-14 DIAGNOSIS — R7989 Other specified abnormal findings of blood chemistry: Secondary | ICD-10-CM | POA: Diagnosis not present

## 2020-05-14 DIAGNOSIS — O4691 Antepartum hemorrhage, unspecified, first trimester: Secondary | ICD-10-CM | POA: Diagnosis not present

## 2020-05-14 LAB — CBC WITH DIFFERENTIAL/PLATELET
Abs Immature Granulocytes: 0.01 10*3/uL (ref 0.00–0.07)
Basophils Absolute: 0 10*3/uL (ref 0.0–0.1)
Basophils Relative: 0 %
Eosinophils Absolute: 0 10*3/uL (ref 0.0–0.5)
Eosinophils Relative: 0 %
HCT: 43.2 % (ref 36.0–46.0)
Hemoglobin: 14.5 g/dL (ref 12.0–15.0)
Immature Granulocytes: 0 %
Lymphocytes Relative: 51 %
Lymphs Abs: 1.6 10*3/uL (ref 0.7–4.0)
MCH: 30.7 pg (ref 26.0–34.0)
MCHC: 33.6 g/dL (ref 30.0–36.0)
MCV: 91.5 fL (ref 80.0–100.0)
Monocytes Absolute: 0.3 10*3/uL (ref 0.1–1.0)
Monocytes Relative: 9 %
Neutro Abs: 1.2 10*3/uL — ABNORMAL LOW (ref 1.7–7.7)
Neutrophils Relative %: 40 %
Platelets: 121 10*3/uL — ABNORMAL LOW (ref 150–400)
RBC: 4.72 MIL/uL (ref 3.87–5.11)
RDW: 13.2 % (ref 11.5–15.5)
WBC: 3.1 10*3/uL — ABNORMAL LOW (ref 4.0–10.5)
nRBC: 0 % (ref 0.0–0.2)

## 2020-05-14 LAB — COMPREHENSIVE METABOLIC PANEL
ALT: 71 U/L — ABNORMAL HIGH (ref 0–44)
AST: 42 U/L — ABNORMAL HIGH (ref 15–41)
Albumin: 3.6 g/dL (ref 3.5–5.0)
Alkaline Phosphatase: 61 U/L (ref 38–126)
Anion gap: 10 (ref 5–15)
BUN: 10 mg/dL (ref 6–20)
CO2: 24 mmol/L (ref 22–32)
Calcium: 8.5 mg/dL — ABNORMAL LOW (ref 8.9–10.3)
Chloride: 102 mmol/L (ref 98–111)
Creatinine, Ser: 0.73 mg/dL (ref 0.44–1.00)
GFR, Estimated: >60 mL/min (ref >60)
Glucose, Bld: 91 mg/dL (ref 70–99)
Potassium: 4.7 mmol/L (ref 3.5–5.1)
Sodium: 136 mmol/L (ref 135–145)
Total Bilirubin: 0.8 mg/dL (ref 0.3–1.2)
Total Protein: 6.4 g/dL — ABNORMAL LOW (ref 6.5–8.1)

## 2020-05-14 LAB — URINALYSIS, ROUTINE W REFLEX MICROSCOPIC
Bacteria, UA: NONE SEEN
Bilirubin Urine: NEGATIVE
Glucose, UA: NEGATIVE mg/dL
Hgb urine dipstick: NEGATIVE
Ketones, ur: 20 mg/dL — AB
Leukocytes,Ua: NEGATIVE
Nitrite: NEGATIVE
Protein, ur: 30 mg/dL — AB
Specific Gravity, Urine: 1.027 (ref 1.005–1.030)
pH: 5 (ref 5.0–8.0)

## 2020-05-14 LAB — CBC
HCT: 41.3 % (ref 36.0–46.0)
Hemoglobin: 14.5 g/dL (ref 12.0–15.0)
MCH: 31.9 pg (ref 26.0–34.0)
MCHC: 35.1 g/dL (ref 30.0–36.0)
MCV: 90.8 fL (ref 80.0–100.0)
Platelets: 111 10*3/uL — ABNORMAL LOW (ref 150–400)
RBC: 4.55 MIL/uL (ref 3.87–5.11)
RDW: 13.2 % (ref 11.5–15.5)
WBC: 2.9 10*3/uL — ABNORMAL LOW (ref 4.0–10.5)
nRBC: 0 % (ref 0.0–0.2)

## 2020-05-14 LAB — RESP PANEL BY RT-PCR (RSV, FLU A&B, COVID)  RVPGX2
Influenza A by PCR: NEGATIVE
Influenza B by PCR: NEGATIVE
Resp Syncytial Virus by PCR: NEGATIVE
SARS Coronavirus 2 by RT PCR: NEGATIVE

## 2020-05-14 LAB — HCG, QUANTITATIVE, PREGNANCY: hCG, Beta Chain, Quant, S: 1220 m[IU]/mL — ABNORMAL HIGH (ref <5)

## 2020-05-14 LAB — HEPATITIS PANEL, ACUTE
HCV Ab: NONREACTIVE
Hep A IgM: NONREACTIVE
Hep B C IgM: NONREACTIVE
Hepatitis B Surface Ag: NONREACTIVE

## 2020-05-14 NOTE — MAU Provider Note (Signed)
History     CSN: [id number]  Arrival date and time: 05/14/20 1607   Event Date/Time   First Provider Initiated Contact with Patient 05/14/20 1045      Chief Complaint  Patient presents with  . Vaginal Bleeding  . Abdominal Pain   Ms. Brooke [name] is a 35 y.o. G2P0101 at [redacted]w[redacted]d who presents to MAU for vaginal bleeding which began last night around 6pm. Patient states initially she had intense upper abdominal pain and thought it was another blockage related to her gastric surgery 01/2020, after which she experienced a blockage and similar pain. Patient reports shortly after this pain started, she began experiencing vaginal bleeding. Patient describes the bleeding as only when wiping. Patient denies abdominal pain at this time, but reports the area of her upper abdomen where she experienced the pain last night is "sore." Patient denies smoking, using NSAIDs, reports bowel movement for the past 3 days and reports passing gas multiple times within past 24 hours. Patient has already been seen at [facility] and was last seen yesterday and diagnosed with BV and yeast, for which she has already been sent medication, but has not started.  Passing blood clots? no Blood soaking clothes? no Lightheaded/dizzy? no Significant pelvic pain or cramping? no Passed any tissue? no  Pt denies vaginal discharge/odor/itching. Pt denies N/V, abdominal pain, constipation, diarrhea, or urinary problems. Pt denies fever, chills, fatigue, sweating or changes in appetite. Pt denies SOB or chest pain. Pt denies dizziness, HA, light-headedness, weakness.   OB History    Gravida  2   Para  1   Term      Preterm  1   AB      Living  1     SAB      IAB      Ectopic      Multiple  0   Live Births  1           Past Medical History:  Diagnosis Date  . Alopecia    wears hair topper - ok to wear during 02/16/17 surgery per Dr Seward Speck  . Anxiety    no meds  . 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  Marland Kitchen GERD (gastroesophageal reflux disease)   . Hypertension    Resolved -Hx with 2016 pregnancy only  . Migraines    last one last week 01/2017-  . PCOS (polycystic ovarian syndrome)   . Polycystic disease, ovaries   . SVD (spontaneous vaginal delivery) 2016   x 1    Past Surgical History:  Procedure Laterality Date  . CERVICAL CERCLAGE N/A 06/26/2014   Procedure: CERCLAGE CERVICAL;  Surgeon: Lovenia Kim, MD;  Location: [facility];  Service: Gynecology;  Laterality: N/A;  . DILATATION & CURETTAGE/HYSTEROSCOPY WITH MYOSURE N/A 02/16/2017   Procedure: DILATATION & CURETTAGE/HYSTEROSCOPY WITH MYOSURE RESECTION OF ENDOMETRIAL POLYP;  Surgeon: Brien Few, MD;  Location: [facility];  Service: Gynecology;  Laterality: N/A;  . 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 TONSILLECTOMY    . tubes in ears     as child  . WISDOM TOOTH EXTRACTION      Family History  Problem Relation Age of Onset  . Diabetes Mother   . Hypertension Mother   . Hypertension Father   . Alcohol abuse Maternal Grandmother   . Hearing loss Paternal Grandfather     Social History   Tobacco Use  .  Smoking status: Never Smoker  . Smokeless tobacco: Never Used  Vaping Use  . Vaping Use: Never used  Substance Use Topics  . Alcohol use: No  . Drug use: No    Allergies: No Known Allergies  Medications Prior to Admission  Medication Sig Dispense Refill Last Dose  . Multiple Vitamin (MULTIVITAMIN) tablet Take 1 tablet by mouth daily.     . pantoprazole (PROTONIX) 20 MG tablet Take 20 mg by mouth as needed.   05/14/2020 at Unknown time    Review of Systems  Constitutional: Negative for chills, diaphoresis, fatigue and fever.  Eyes: Negative for visual disturbance.  Respiratory: Negative for shortness of breath.   Cardiovascular: Negative for chest pain.  Gastrointestinal: Negative for abdominal pain, constipation,  diarrhea, nausea and vomiting.  Genitourinary: Positive for vaginal bleeding. Negative for dysuria, flank pain, frequency, pelvic pain, urgency and vaginal discharge.  Neurological: Negative for dizziness, weakness, light-headedness and headaches.   Physical Exam   Blood pressure 132/78, pulse 76, temperature 98.7 F (37.1 C), resp. rate 18, height 5\' 4"  (1.626 m), weight 84.8 kg, last menstrual period 03/22/2020.  Patient Vitals for the past 24 hrs:  BP Temp Pulse Resp Height Weight  05/14/20 0957 132/78 98.7 F (37.1 C) 76 18 5\' 4"  (1.626 m) 84.8 kg   Physical Exam Vitals and nursing note reviewed.  Constitutional:      General: She is not in acute distress.    Appearance: Normal appearance. She is not ill-appearing, toxic-appearing or diaphoretic.  HENT:     Head: Normocephalic and atraumatic.  Pulmonary:     Effort: Pulmonary effort is normal.  Neurological:     Mental Status: She is alert and oriented to person, place, and time.  Psychiatric:        Mood and Affect: Mood normal.        Behavior: Behavior normal.        Thought Content: Thought content normal.        Judgment: Judgment normal.    Results for orders placed or performed during the hospital encounter of 05/14/20 (from the past 24 hour(s))  Urinalysis, Routine w reflex microscopic Urine, Clean Catch     Status: Abnormal   Collection Time: 05/14/20 10:28 AM  Result Value Ref Range   Color, Urine AMBER (A) YELLOW   APPearance HAZY (A) CLEAR   Specific Gravity, Urine 1.027 1.005 - 1.030   pH 5.0 5.0 - 8.0   Glucose, UA NEGATIVE NEGATIVE mg/dL   Hgb urine dipstick NEGATIVE NEGATIVE   Bilirubin Urine NEGATIVE NEGATIVE   Ketones, ur 20 (A) NEGATIVE mg/dL   Protein, ur 30 (A) NEGATIVE mg/dL   Nitrite NEGATIVE NEGATIVE   Leukocytes,Ua NEGATIVE NEGATIVE   RBC / HPF 0-5 0 - 5 RBC/hpf   WBC, UA 0-5 0 - 5 WBC/hpf   Bacteria, UA NONE SEEN NONE SEEN   Squamous Epithelial / LPF 0-5 0 - 5   Mucus PRESENT     Hyaline Casts, UA PRESENT   CBC     Status: Abnormal   Collection Time: 05/14/20 11:39 AM  Result Value Ref Range   WBC 2.9 (L) 4.0 - 10.5 K/uL   RBC 4.55 3.87 - 5.11 MIL/uL   Hemoglobin 14.5 12.0 - 15.0 g/dL   HCT 07/12/20 07/12/20 - 82.5 %   MCV 90.8 80.0 - 100.0 fL   MCH 31.9 26.0 - 34.0 pg   MCHC 35.1 30.0 - 36.0 g/dL   RDW 05.3 97.6 - 73.4 %  Platelets 111 (L) 150 - 400 K/uL   nRBC 0.0 0.0 - 0.2 %  Comprehensive metabolic panel     Status: Abnormal   Collection Time: 05/14/20 11:39 AM  Result Value Ref Range   Sodium 136 135 - 145 mmol/L   Potassium 4.7 3.5 - 5.1 mmol/L   Chloride 102 98 - 111 mmol/L   CO2 24 22 - 32 mmol/L   Glucose, Bld 91 70 - 99 mg/dL   BUN 10 6 - 20 mg/dL   Creatinine, Ser 0.35 0.44 - 1.00 mg/dL   Calcium 8.5 (L) 8.9 - 10.3 mg/dL   Total Protein 6.4 (L) 6.5 - 8.1 g/dL   Albumin 3.6 3.5 - 5.0 g/dL   AST 42 (H) 15 - 41 U/L   ALT 71 (H) 0 - 44 U/L   Alkaline Phosphatase 61 38 - 126 U/L   Total Bilirubin 0.8 0.3 - 1.2 mg/dL   GFR, Estimated >00 >93 mL/min   Anion gap 10 5 - 15  hCG, quantitative, pregnancy     Status: Abnormal   Collection Time: 05/14/20 11:39 AM  Result Value Ref Range   hCG, Beta Chain, Quant, S 1,220 (H) <5 mIU/mL  CBC with Differential/Platelet     Status: Abnormal   Collection Time: 05/14/20 11:39 AM  Result Value Ref Range   WBC 3.1 (L) 4.0 - 10.5 K/uL   RBC 4.72 3.87 - 5.11 MIL/uL   Hemoglobin 14.5 12.0 - 15.0 g/dL   HCT 81.8 29.9 - 37.1 %   MCV 91.5 80.0 - 100.0 fL   MCH 30.7 26.0 - 34.0 pg   MCHC 33.6 30.0 - 36.0 g/dL   RDW 69.6 78.9 - 38.1 %   Platelets 121 (L) 150 - 400 K/uL   nRBC 0.0 0.0 - 0.2 %   Neutrophils Relative % 40 %   Neutro Abs 1.2 (L) 1.7 - 7.7 K/uL   Lymphocytes Relative 51 %   Lymphs Abs 1.6 0.7 - 4.0 K/uL   Monocytes Relative 9 %   Monocytes Absolute 0.3 0.1 - 1.0 K/uL   Eosinophils Relative 0 %   Eosinophils Absolute 0.0 0.0 - 0.5 K/uL   Basophils Relative 0 %   Basophils Absolute 0.0 0.0 - 0.1  K/uL   Immature Granulocytes 0 %   Abs Immature Granulocytes 0.01 0.00 - 0.07 K/uL   US OB LESS THAN 14 WEEKS WITH OB TRANSVAGINAL  Result Date: 05/14/2020 CLINICAL DATA:  Vaginal bleeding. EXAM: OBSTETRIC <14 WK [place] AND TRANSVAGINAL OB US TECHNIQUE: Both transabdominal and transvaginal ultrasound examinations were performed for complete evaluation of the gestation as well as the maternal uterus, adnexal regions, and pelvic cul-de-sac. Transvaginal technique was performed to assess early pregnancy. COMPARISON:  Ultrasound 09/15/2014. FINDINGS: Intrauterine gestational sac: None visualized Yolk sac:  None visualized Embryo:  None visualized Cardiac Activity: None visualized Subchorionic hemorrhage:  None visualized Maternal uterus/adnexae: Prominent thickening of the endometrium at 27 mm. 1.5 cm complex cyst right ovary. 2.4 cm complex cyst with internal echoes/septation noted the left ovary. Correlation with pregnancy test suggested to exclude ectopic pregnancy. Follow-up pelvic ultrasound can be obtained to demonstrate resolution of these findings. Bilateral ovarian blood flow noted. Trace free pelvic fluid noted. IMPRESSION: 1. No intrauterine gestational sac noted. Prominent thickening of the endometrium at 27 mm. 2.  Bilateral small complex ovarian cysts. 3.  Trace free pelvic fluid. Pregnancy test suggested to further evaluate and to exclude ectopic pregnancy. Continued follow-up pelvic ultrasounds to demonstrate resolution of  the above findings suggested. Electronically Signed   By: Maisie Fus  Register   On: 05/14/2020 13:30    MAU Course  Procedures  MDM -r/o ectopic -UA: amber/hazy/20ketones/30PRO -CBC: WBCs 2.9, platelets 111 -CMP: AST/ALT 42/71 -[place]: prominent thickening of endometrium at 64mm, 1.5cm complex cyst right ovary, 2.4cm complex cyst with internal echoes/septation in left ovary, bilateral ovarian blood flow, trace free pelvic fluid -hCG: 1,220 -ABO: B Positive -WetPrep: pt had this  collected at OB's office yesterday and was diagnosed and treated for BV and yeast -GC/CT patient declines -Discussed with client the diagnosis of pregnancy of unknown anatomic location.  Three possibilities of outcome are: a healthy pregnancy that is too early to see a yolk sac to confirm the pregnancy is in the uterus, a pregnancy that is not healthy and has not developed and will not develop, and an ectopic pregnancy that is in the abdomen that cannot be identified at this time.  And ectopic pregnancy can be a life threatening situation as a pregnancy needs to be in the uterus which is a muscle and can stretch to accommodate the growth of a pregnancy.  Other structures in the pelvis and abdomen as not muscular and do not stretch with the growth of a pregnancy.  Worst case scenario is that a structure ruptures with a growing pregnancy not in the uterus and and internal hemorrhage can be a life threatening situation.  We need to follow the progression of this pregnancy carefully.  We need to check another serum pregnancy hormone level to determine if the levels are rising appropriately  and to determine the next steps that are needed for you. Patient's questions were answered. -consulted with Dr. Crissie Reese who recommends offering patient COVID swab and return in 48 hours for repeat hCG and CBC/CMP as well as getting CBC w/ Diff while patient is present in MAU -COVID collected -CBC w/Diff: neutrophils 1.2, per Dr. Crissie Reese, pt OK to be discharged home -pt discharged to home in stable condition  Orders Placed This Encounter  Procedures  . Resp panel by RT-PCR (RSV, Flu A&B, Covid) Nasopharyngeal Swab    Standing Status:   Standing    Number of Occurrences:   1    Order Specific Question:   Is this test for diagnosis or screening    Answer:   Diagnosis of ill patient    Order Specific Question:   Symptomatic for COVID-19 as defined by CDC    Answer:   No    Order Specific Question:   Hospitalized for  COVID-19    Answer:   No    Order Specific Question:   Admitted to ICU for COVID-19    Answer:   No    Order Specific Question:   Previously tested for COVID-19    Answer:   No    Order Specific Question:   Resident in a congregate (group) care setting    Answer:   No    Order Specific Question:   Employed in healthcare setting    Answer:   No    Order Specific Question:   Pregnant    Answer:   Yes    Order Specific Question:   Has patient completed COVID vaccination(s) (2 doses of Pfizer/Moderna 1 dose of [employer])    Answer:   No  . US OB LESS THAN 14 WEEKS WITH OB TRANSVAGINAL    Standing Status:   Standing    Number of Occurrences:   1  Order Specific Question:   Symptom/Reason for Exam    Answer:   Vaginal bleeding in pregnancy [705036]  . US PELVIC COMPLETE WITH TRANSVAGINAL    Standing Status:   Future    Standing Expiration Date:   05/14/2021    Order Specific Question:   Reason for Exam (SYMPTOM  OR DIAGNOSIS REQUIRED)    Answer:   ovarian cysts bilateral    Order Specific Question:   Preferred imaging location?    Answer:   [facility]  . Urinalysis, Routine w reflex microscopic Urine, Clean Catch    Standing Status:   Standing    Number of Occurrences:   1  . CBC    Standing Status:   Standing    Number of Occurrences:   1  . Comprehensive metabolic panel    Standing Status:   Standing    Number of Occurrences:   1  . hCG, quantitative, pregnancy    Standing Status:   Standing    Number of Occurrences:   1  . CBC with Differential/Platelet    Standing Status:   Standing    Number of Occurrences:   1  . Hepatitis panel, acute    Standing Status:   Standing    Number of Occurrences:   1  . Airborne precautions    Standing Status:   Standing    Number of Occurrences:   1  . Discharge patient    Order Specific Question:   Discharge disposition    Answer:   01-Home or Self Care [1]    Order Specific Question:   Discharge patient date    Answer:    05/14/2020   No orders of the defined types were placed in this encounter.  Assessment and Plan   1. Pregnancy of unknown anatomic location   2. Vaginal bleeding in pregnancy   3. Neutropenia, unspecified type (HCC)   4. Blood type, Rh positive   5. Thrombocytopenia (HCC)   6. Elevated LFTs   7. Bilateral ovarian cysts     Allergies as of 05/14/2020   No Known Allergies     Medication List    TAKE these medications   multivitamin tablet Take 1 tablet by mouth daily.   pantoprazole 20 MG tablet Commonly known as: PROTONIX Take 20 mg by mouth as needed.       -will call with culture results, if positive -safe meds in pregnancy list given -f/u US in 3-6 mo for ovarian cysts, order entered for f/u US -discussed ectopic vs. SAB vs. miscarriage -strict ectopic precautions given -return MAU precautions -f/u on MAU at 05/16/2020 at 1130AM for repeat hCG, CBC, CMP -pt discharged to home in stable condition  Joni Reining E Lizzie Cokley 05/14/2020, 4:11 PM

## 2020-05-14 NOTE — MAU Note (Signed)
Pt reports she is having pain in her upper abd and spotting that started yesterday around 6pm. Came to MAU last night but left due to long wait time. Still having pain and bleeding oday but pain is not as intense as it was yesterday. Pt stated she had Gastric bypass in September and had some complications due to blockages which have resolved. Thought the pain migh be from that but then she stared having vag bleeding as well.

## 2020-05-14 NOTE — Discharge Instructions (Signed)
Safe Medications in Pregnancy    Acne: Benzoyl Peroxide Salicylic Acid  Backache/Headache: Tylenol: 2 regular strength every 4 hours OR              2 Extra strength every 6 hours  Colds/Coughs/Allergies: Benadryl (alcohol free) 25 mg every 6 hours as needed Breath right strips Claritin Cepacol throat lozenges Chloraseptic throat spray Cold-Eeze- up to three times per day Cough drops, alcohol free Flonase (by prescription only) Guaifenesin Mucinex Robitussin DM (plain only, alcohol free) Saline nasal spray/drops Sudafed (pseudoephedrine) & Actifed ** use only after [redacted] weeks gestation and if you do not have high blood pressure Tylenol Vicks Vaporub Zinc lozenges Zyrtec   Constipation: Colace Ducolax suppositories Fleet enema Glycerin suppositories Metamucil Milk of magnesia Miralax Senokot Smooth move tea  Diarrhea: Kaopectate Imodium A-D  *NO pepto Bismol  Hemorrhoids: Anusol Anusol HC Preparation H Tucks  Indigestion: Tums Maalox Mylanta Zantac  Pepcid  Insomnia: Benadryl (alcohol free) 25mg  every 6 hours as needed Tylenol PM Unisom, no Gelcaps  Leg Cramps: Tums MagGel  Nausea/Vomiting:  Bonine Dramamine Emetrol Ginger extract Sea bands Meclizine  Nausea medication to take during pregnancy:  Unisom (doxylamine succinate 25 mg tablets) Take one tablet daily at bedtime. If symptoms are not adequately controlled, the dose can be increased to a maximum recommended dose of two tablets daily (1/2 tablet in the morning, 1/2 tablet mid-afternoon and one at bedtime). Vitamin B6 100mg  tablets. Take one tablet twice a day (up to 200 mg per day).  Skin Rashes: Aveeno products Benadryl cream or 25mg  every 6 hours as needed Calamine Lotion 1% cortisone cream  Yeast infection: Gyne-lotrimin 7 Monistat 7   **If taking multiple medications, please check labels to avoid duplicating the same active ingredients **take  medication as directed on the label ** Do not exceed 4000 mg of tylenol in 24 hours **Do not take medications that contain aspirin or ibuprofen          Ectopic Pregnancy  An ectopic pregnancy is when the fertilized egg attaches (implants) outside the uterus. Most ectopic pregnancies occur in one of the tubes where eggs travel from the ovary to the uterus (fallopian tubes), but the implanting can occur in other locations. In rare cases, ectopic pregnancies occur on the ovary, intestine, pelvis, abdomen, or cervix. In an ectopic pregnancy, the fertilized egg does not have the ability to develop into a normal, healthy baby. A ruptured ectopic pregnancy is one in which tearing or bursting of a fallopian tube causes internal bleeding. Often, there is intense lower abdominal pain, and vaginal bleeding sometimes occurs. Having an ectopic pregnancy can be life-threatening. If this dangerous condition is not treated, it can lead to blood loss, shock, or even death. What are the causes? The most common cause of this condition is damage to one of the fallopian tubes. A fallopian tube may be narrowed or blocked, and that keeps the fertilized egg from reaching the uterus. What increases the risk? This condition is more likely to develop in women of childbearing age who have different levels of risk. The levels of risk can be divided into three categories. High risk  You have gone through infertility treatment.  You have had an ectopic pregnancy before.  You have had surgery on the fallopian tubes, or another surgical procedure, such as an abortion.  You have had surgery to have the fallopian tubes tied (tubal ligation).  You have problems or diseases of the fallopian tubes.  You have been exposed  to diethylstilbestrol (DES). This medicine was used until 1971, and it had effects on babies whose mothers took the medicine.  You become pregnant while using an IUD (intrauterine device) for birth  control. Moderate risk  You have a history of infertility.  You have had an STI (sexually transmitted infection).  You have a history of pelvic inflammatory disease (PID).  You have scarring from endometriosis.  You have multiple sexual partners.  You smoke. Low risk  You have had pelvic surgery.  You use vaginal douches.  You became sexually active before age 89. What are the signs or symptoms? Common symptoms of this condition include normal pregnancy symptoms, such as missing a period, nausea, tiredness, abdominal pain, breast tenderness, and bleeding. However, ectopic pregnancy will have additional symptoms, such as:  Pain with intercourse.  Irregular vaginal bleeding or spotting.  Cramping or pain on one side or in the lower abdomen.  Fast heartbeat, low blood pressure, and sweating.  Passing out while having a bowel movement. Symptoms of a ruptured ectopic pregnancy and internal bleeding may include:  Sudden, severe pain in the abdomen and pelvis.  Dizziness, weakness, light-headedness, or fainting.  Pain in the shoulder or neck area. How is this diagnosed? This condition is diagnosed by:  A pelvic exam to locate pain or a mass in the abdomen.  A pregnancy test. This blood test checks for the presence as well as the specific level of pregnancy hormone in the bloodstream.  Ultrasound. This is performed if a pregnancy test is positive. In this test, a probe is inserted into the vagina. The probe will detect a fetus, possibly in a location other than the uterus.  Taking a sample of uterus tissue (dilation and curettage, or D&C).  Surgery to perform a visual exam of the inside of the abdomen using a thin, lighted tube that has a tiny camera on the end (laparoscope).  Culdocentesis. This procedure involves inserting a needle at the top of the vagina, behind the uterus. If blood is present in this area, it may indicate that a fallopian tube is torn. How is this  treated? This condition is treated with medicine or surgery. Medicine  An injection of a medicine (methotrexate) may be given to cause the pregnancy tissue to be absorbed. This medicine may save your fallopian tube. It may be given if: ? The diagnosis is made early, with no signs of active bleeding. ? The fallopian tube has not ruptured. ? You are considered to be a good candidate for the medicine. Usually, pregnancy hormone blood levels are checked after methotrexate treatment. This is to be sure that the medicine is effective. It may take 4-6 weeks for the pregnancy to be absorbed. Most pregnancies will be absorbed by 3 weeks. Surgery  A laparoscope may be used to remove the pregnancy tissue.  If severe internal bleeding occurs, a larger cut (incision) may be made in the lower abdomen (laparotomy) to remove the fetus and placenta. This is done to stop the bleeding.  Part or all of the fallopian tube may be removed (salpingectomy) along with the fetus and placenta. The fallopian tube may also be repaired during the surgery.  In very rare circumstances, removal of the uterus (hysterectomy) may be required.  After surgery, pregnancy hormone testing may be done to be sure that there is no pregnancy tissue left. Whether your treatment is medicine or surgery, you may receive a Rho (D) immune globulin shot to prevent problems with any future pregnancy. This  shot may be given if:  You are Rh-negative and the baby's father is Rh-positive.  You are Rh-negative and you do not know the Rh type of the baby's father. Follow these instructions at home:  Rest and limit your activity after the procedure for as long as told by your health care provider.  Until your health care provider says that it is safe: ? Do not lift anything that is heavier than 10 lb (4.5 kg), or the limit that your health care provider tells you. ? Avoid physical exercise and any movement that requires effort (is  strenuous).  To help prevent constipation: ? Eat a healthy diet that includes fruits, vegetables, and whole grains. ? Drink 6-8 glasses of water per day. Get help right away if:  You develop worsening pain that is not relieved by medicine.  You have: ? A fever or chills. ? Vaginal bleeding. ? Redness and swelling at the incision site. ? Nausea and vomiting.  You feel dizzy or weak.  You feel light-headed or you faint. This information is not intended to replace advice given to you by your health care provider. Make sure you discuss any questions you have with your health care provider. Document Revised: 04/07/2017 Document Reviewed: 11/25/2015 Elsevier Patient Education  2020 [employer].        Miscarriage A miscarriage is the loss of an unborn baby (fetus) before the 20th week of pregnancy. Most miscarriages happen during the first 3 months of pregnancy. Sometimes, a miscarriage can happen before a woman knows that she is pregnant. Having a miscarriage can be an emotional experience. If you have had a miscarriage, talk with your health care provider about any questions you may have about miscarrying, the grieving process, and your plans for future pregnancy. What are the causes? A miscarriage may be caused by:  Problems with the genes or chromosomes of the fetus. These problems make it impossible for the baby to develop normally. They are often the result of random errors that occur early in the development of the baby, and are not passed from parent to child (not inherited).  Infection of the cervix or uterus.  Conditions that affect hormone balance in the body.  Problems with the cervix, such as the cervix opening and thinning before pregnancy is at term (cervical insufficiency).  Problems with the uterus. These may include: ? A uterus with an abnormal shape. ? Fibroids in the uterus. ? Congenital abnormalities. These are problems that were present at  birth.  Certain medical conditions.  Smoking, drinking alcohol, or using drugs.  Injury (trauma). In many cases, the cause of a miscarriage is not known. What are the signs or symptoms? Symptoms of this condition include:  Vaginal bleeding or spotting, with or without cramps or pain.  Pain or cramping in the abdomen or lower back.  Passing fluid, tissue, or blood clots from the vagina. How is this diagnosed? This condition may be diagnosed based on:  A physical exam.  Ultrasound.  Blood tests.  Urine tests. How is this treated? Treatment for a miscarriage is sometimes not necessary if you naturally pass all the tissue that was in your uterus. If necessary, this condition may be treated with:  Dilation and curettage (D&C). This is a procedure in which the cervix is stretched open and the lining of the uterus (endometrium) is scraped. This is done only if tissue from the fetus or placenta remains in the body (incomplete miscarriage).  Medicines, such as: ? Antibiotic  medicine, to treat infection. ? Medicine to help the body pass any remaining tissue. ? Medicine to reduce (contract) the size of the uterus. These medicines may be given if you have a lot of bleeding. If you have Rh negative blood and your baby was Rh positive, you will need a shot of a medicine called Rh immunoglobulinto protect your future babies from Rh blood problems. "Rh-negative" and "Rh-positive" refer to whether or not the blood has a specific protein found on the surface of red blood cells (Rh factor). Follow these instructions at home: Medicines   Take over-the-counter and prescription medicines only as told by your health care provider.  If you were prescribed antibiotic medicine, take it as told by your health care provider. Do not stop taking the antibiotic even if you start to feel better.  Do not take NSAIDs, such as aspirin and ibuprofen, unless they are approved by your health care provider.  These medicines can cause bleeding. Activity  Rest as directed. Ask your health care provider what activities are safe for you.  Have someone help with home and family responsibilities during this time. General instructions  Keep track of the number of sanitary pads you use each day and how soaked (saturated) they are. Write down this information.  Monitor the amount of tissue or blood clots that you pass from your vagina. Save any large amounts of tissue for your health care provider to examine.  Do not use tampons, douche, or have sex until your health care provider approves.  To help you and your partner with the process of grieving, talk with your health care provider or seek counseling.  When you are ready, meet with your health care provider to discuss any important steps you should take for your health. Also, discuss steps you should take to have a healthy pregnancy in the future.  Keep all follow-up visits as told by your health care provider. This is important. Where to find more information  The American Congress of Obstetricians and Gynecologists: www.acog.org  [place] Department of Health and Cytogeneticist of Women's Health: http://hoffman.com/ Contact a health care provider if:  You have a fever or chills.  You have a foul smelling vaginal discharge.  You have more bleeding instead of less. Get help right away if:  You have severe cramps or pain in your back or abdomen.  You pass blood clots or tissue from your vagina that is walnut-sized or larger.  You soak more than 1 regular sanitary pad in an hour.  You become light-headed or weak.  You pass out.  You have feelings of sadness that take over your thoughts, or you have thoughts of hurting yourself. Summary  Most miscarriages happen in the first 3 months of pregnancy. Sometimes miscarriage happens before a woman even knows that she is pregnant.  Follow your health care provider's instruction for  home care. Keep all follow-up appointments.  To help you and your partner with the process of grieving, talk with your health care provider or seek counseling. This information is not intended to replace advice given to you by your health care provider. Make sure you discuss any questions you have with your health care provider. Document Revised: 08/17/2018 Document Reviewed: 05/31/2016 Elsevier Patient Education  2020 [employer].        First Trimester of Pregnancy The first trimester of pregnancy is from week 1 until the end of week 13 (months 1 through 3). A week after a sperm fertilizes  an egg, the egg will implant on the wall of the uterus. This embryo will begin to develop into a baby. Genes from you and your partner will form the baby. The female genes will determine whether the baby will be a boy or a girl. At 6-8 weeks, the eyes and face will be formed, and the heartbeat can be seen on ultrasound. At the end of 12 weeks, all the baby's organs will be formed. Now that you are pregnant, you will want to do everything you can to have a healthy baby. Two of the most important things are to get good prenatal care and to follow your health care provider's instructions. Prenatal care is all the medical care you receive before the baby's birth. This care will help prevent, find, and treat any problems during the pregnancy and childbirth. Body changes during your first trimester Your body goes through many changes during pregnancy. The changes vary from woman to woman.  You may gain or lose a couple of pounds at first.  You may feel sick to your stomach (nauseous) and you may throw up (vomit). If the vomiting is uncontrollable, call your health care provider.  You may tire easily.  You may develop headaches that can be relieved by medicines. All medicines should be approved by your health care provider.  You may urinate more often. Painful urination may mean you have a bladder  infection.  You may develop heartburn as a result of your pregnancy.  You may develop constipation because certain hormones are causing the muscles that push stool through your intestines to slow down.  You may develop hemorrhoids or swollen veins (varicose veins).  Your breasts may begin to grow larger and become tender. Your nipples may stick out more, and the tissue that surrounds them (areola) may become darker.  Your gums may bleed and may be sensitive to brushing and flossing.  Dark spots or blotches (chloasma, mask of pregnancy) may develop on your face. This will likely fade after the baby is born.  Your menstrual periods will stop.  You may have a loss of appetite.  You may develop cravings for certain kinds of food.  You may have changes in your emotions from day to day, such as being excited to be pregnant or being concerned that something may go wrong with the pregnancy and baby.  You may have more vivid and strange dreams.  You may have changes in your hair. These can include thickening of your hair, rapid growth, and changes in texture. Some women also have hair loss during or after pregnancy, or hair that feels dry or thin. Your hair will most likely return to normal after your baby is born. What to expect at prenatal visits During a routine prenatal visit:  You will be weighed to make sure you and the baby are growing normally.  Your blood pressure will be taken.  Your abdomen will be measured to track your baby's growth.  The fetal heartbeat will be listened to between weeks 10 and 14 of your pregnancy.  Test results from any previous visits will be discussed. Your health care provider may ask you:  How you are feeling.  If you are feeling the baby move.  If you have had any abnormal symptoms, such as leaking fluid, bleeding, severe headaches, or abdominal cramping.  If you are using any tobacco products, including cigarettes, chewing tobacco, and  electronic cigarettes.  If you have any questions. Other tests that may be performed  during your first trimester include:  Blood tests to find your blood type and to check for the presence of any previous infections. The tests will also be used to check for low iron levels (anemia) and protein on red blood cells (Rh antibodies). Depending on your risk factors, or if you previously had diabetes during pregnancy, you may have tests to check for high blood sugar that affects pregnant women (gestational diabetes).  Urine tests to check for infections, diabetes, or protein in the urine.  An ultrasound to confirm the proper growth and development of the baby.  Fetal screens for spinal cord problems (spina bifida) and Down syndrome.  HIV (human immunodeficiency virus) testing. Routine prenatal testing includes screening for HIV, unless you choose not to have this test.  You may need other tests to make sure you and the baby are doing well. Follow these instructions at home: Medicines  Follow your health care provider's instructions regarding medicine use. Specific medicines may be either safe or unsafe to take during pregnancy.  Take a prenatal vitamin that contains at least 600 micrograms (mcg) of folic acid.  If you develop constipation, try taking a stool softener if your health care provider approves. Eating and drinking   Eat a balanced diet that includes fresh fruits and vegetables, whole grains, good sources of protein such as meat, eggs, or tofu, and low-fat dairy. Your health care provider will help you determine the amount of weight gain that is right for you.  Avoid raw meat and uncooked cheese. These carry germs that can cause birth defects in the baby.  Eating four or five small meals rather than three large meals a day may help relieve nausea and vomiting. If you start to feel nauseous, eating a few soda crackers can be helpful. Drinking liquids between meals, instead of during  meals, also seems to help ease nausea and vomiting.  Limit foods that are high in fat and processed sugars, such as fried and sweet foods.  To prevent constipation: ? Eat foods that are high in fiber, such as fresh fruits and vegetables, whole grains, and beans. ? Drink enough fluid to keep your urine clear or pale yellow. Activity  Exercise only as directed by your health care provider. Most women can continue their usual exercise routine during pregnancy. Try to exercise for 30 minutes at least 5 days a week. Exercising will help you: ? Control your weight. ? Stay in shape. ? Be prepared for labor and delivery.  Experiencing pain or cramping in the lower abdomen or lower back is a good sign that you should stop exercising. Check with your health care provider before continuing with normal exercises.  Try to avoid standing for long periods of time. Move your legs often if you must stand in one place for a long time.  Avoid heavy lifting.  Wear low-heeled shoes and practice good posture.  You may continue to have sex unless your health care provider tells you not to. Relieving pain and discomfort  Wear a good support bra to relieve breast tenderness.  Take warm sitz baths to soothe any pain or discomfort caused by hemorrhoids. Use hemorrhoid cream if your health care provider approves.  Rest with your legs elevated if you have leg cramps or low back pain.  If you develop varicose veins in your legs, wear support hose. Elevate your feet for 15 minutes, 3-4 times a day. Limit salt in your diet. Prenatal care  Schedule your prenatal visits by the  twelfth week of pregnancy. They are usually scheduled monthly at first, then more often in the last 2 months before delivery.  Write down your questions. Take them to your prenatal visits.  Keep all your prenatal visits as told by your health care provider. This is important. Safety  Wear your seat belt at all times when  driving.  Make a list of emergency phone numbers, including numbers for family, friends, the hospital, and police and fire departments. General instructions  Ask your health care provider for a referral to a local prenatal education class. Begin classes no later than the beginning of month 6 of your pregnancy.  Ask for help if you have counseling or nutritional needs during pregnancy. Your health care provider can offer advice or refer you to specialists for help with various needs.  Do not use hot tubs, steam rooms, or saunas.  Do not douche or use tampons or scented sanitary pads.  Do not cross your legs for long periods of time.  Avoid cat litter boxes and soil used by cats. These carry germs that can cause birth defects in the baby and possibly loss of the fetus by miscarriage or stillbirth.  Avoid all smoking, herbs, alcohol, and medicines not prescribed by your health care provider. Chemicals in these products affect the formation and growth of the baby.  Do not use any products that contain nicotine or tobacco, such as cigarettes and e-cigarettes. If you need help quitting, ask your health care provider. You may receive counseling support and other resources to help you quit.  Schedule a dentist appointment. At home, brush your teeth with a soft toothbrush and be gentle when you floss. Contact a health care provider if:  You have dizziness.  You have mild pelvic cramps, pelvic pressure, or nagging pain in the abdominal area.  You have persistent nausea, vomiting, or diarrhea.  You have a bad smelling vaginal discharge.  You have pain when you urinate.  You notice increased swelling in your face, hands, legs, or ankles.  You are exposed to fifth disease or chickenpox.  You are exposed to [place] measles (rubella) and have never had it. Get help right away if:  You have a fever.  You are leaking fluid from your vagina.  You have spotting or bleeding from your  vagina.  You have severe abdominal cramping or pain.  You have rapid weight gain or loss.  You vomit blood or material that looks like coffee grounds.  You develop a severe headache.  You have shortness of breath.  You have any kind of trauma, such as from a fall or a car accident. Summary  The first trimester of pregnancy is from week 1 until the end of week 13 (months 1 through 3).  Your body goes through many changes during pregnancy. The changes vary from woman to woman.  You will have routine prenatal visits. During those visits, your health care provider will examine you, discuss any test results you may have, and talk with you about how you are feeling. This information is not intended to replace advice given to you by your health care provider. Make sure you discuss any questions you have with your health care provider. Document Revised: 04/07/2017 Document Reviewed: 04/06/2016 Elsevier Patient Education  2020 [employer]        Pregnancy and COVID-19 Coronavirus disease, also called COVID-19, is an infection of the lungs and airways (respiratory tract). It is unclear at this time if pregnancy makes it  more likely for you to get COVID-19, or what effects the infection may have on your unborn baby. However, pregnancy causes changes to your heart, lungs, and your body's disease-fighting system (immune system). Some of these changes make it more likely for you to get sick and have more serious illness. Therefore, it is important for you to take precautions in order to protect yourself and your unborn baby. There have been studies showing that obesity and diabetes may put you at higher risk for serious illness. If you are pregnant and are obese or have diabetes, you should take extra precautions to protect yourself from the virus. Work with your health care team to develop a plan to protect yourself from all infections, including COVID-19. This is one way for you to stay  healthy during your pregnancy and to keep your baby healthy as well. How does this affect me? If you get COVID-19, there is a risk that you may:  Get a respiratory illness that can lead to pneumonia.  Give birth to your baby before 37 weeks of pregnancy (premature birth). If you have or may have COVID-19, your health care provider may recommend special precautions around your pregnancy. This may affect how you:  Receive care before delivery (prenatal care). How you visit your health care provider may change. Tests and scans may need to be performed differently.  Receive care during labor and delivery. This may affect your birth plan, including who may be with you during labor and delivery.  Receive care after you deliver your baby (postpartum care). You may stay longer in the hospital and in a special room.  Feed your baby after he or she is born. Pregnancy can be an especially stressful time because of the changes in your body and the preparation involved in becoming a parent. In addition, you may be feeling especially fearful, anxious, or stressed because of COVID-19 and how it is affecting you. How does this affect my baby? It is not known whether a mother will transmit the virus to her unborn baby. There is a risk that if you get COVID-19:  The virus that causes COVID-19 can pass to your baby.  You may have premature birth. Your baby may require more medical care if this happens. What can I do to lower my risk?  There is no vaccine to help prevent COVID-19. However, there are actions that you can take to protect yourself and others from this virus. Cleaning and personal hygiene  Wash your hands often with soap and water for at least 20 seconds. If soap and water are not available, use alcohol-based hand sanitizer.  Avoid touching your mouth, face, eyes, or nose.  Clean and disinfect objects and surfaces that are frequently touched every day. These may include: ? Counters and  tables. ? Doorknobs and light switches. ? Sinks and faucets. ? Electronics such as phones, remote controls, keyboards, computers, and tablets. Stay away from others  Stay away from people who are sick, if possible.  Avoid social gatherings and travel.  Stay home as much as possible. Follow these instructions: Breastfeeding It is not known if the virus that causes COVID-19 can pass through breast milk to your baby. You should make a plan for feeding your infant with your family and your health care team. If you have or may have COVID-19, your health care provider may recommend that you take precautions while breastfeeding, such as:  Washing your hands before feeding your baby.  Wearing a mask while  feeding your baby.  Pumping or expressing breast milk to feed to your baby. If possible, ask someone in your household who is not sick to feed your baby the expressed breast milk. ? Wash your hands before touching pump parts. ? Wash and disinfect all pump parts after expressing milk. Follow the manufacturer's instructions to clean and disinfect all pump parts. General instructions  If you think you have a COVID-19 infection, contact your health care provider right away. Tell your health care provider that you think you may have a COVID-19 infection.  Follow your health care provider's instructions on taking medicines. Some medicines may be unsafe to take during pregnancy.  Cover your mouth and nose by wearing a mask or other cloth covering over your face when you go out in public.  Find ways to manage stress. These may include: ? Using relaxation techniques like meditation and deep breathing. ? Getting regular exercise. Most women can continue their usual exercise routine during pregnancy. Ask your health care provider what activities are safe for you. ? Seeking support from family, friends, or spiritual resources. If you cannot be together in person, you can still connect by phone calls,  texts, video calls, or online messaging. ? Spending time doing relaxing activities that you enjoy, like listening to music or reading a good book.  Ask for help if you have counseling or nutritional needs during pregnancy. Your health care provider can offer advice or refer you to resources or specialists who can help you with various needs.  Keep all follow-up visits as told by your health care provider. This is important. Where to find more information Centers for Disease Control and Prevention (CDC): AffordableShare.com.br World Health Organization [facility]): PokerPortraits.es [employer] (ACOG): BuyDucts.dk Questions to ask your health care team  What should I do if I have COVID-19 symptoms?  How will COVID-19 affect my prenatal care visits, tests and scans, labor and delivery, and postpartum care?  Should I plan to breastfeed my baby?  Where can I find mental health resources?  Where can I find support if I have financial concerns? Contact a health care provider if:  You have signs and symptoms of infection, including a fever or cough. Tell your health care team that you think you may have a COVID-19 infection.  You have strong emotions, such as sadness or anxiety.  You feel unsafe in your home and need help finding a safe place to live.  You have bloody or watery vaginal discharge or vaginal bleeding. Get help right away if:  You have signs or symptoms of labor before 37 weeks of pregnancy. These include: ? Contractions that are 5 minutes or less apart, or that increase in frequency, intensity, or length. ? Sudden, sharp pain in the abdomen or in the lower back. ? A gush or trickle of fluid from your vagina.  You have signs of more serious illness such as: ? You have difficulty  breathing. ? You have chest pain. ? You have a fever greater than 102F (39C) or higher that does not go away. ? You cannot drink fluids without vomiting. ? You feel extremely weak or you faint. These symptoms may represent a serious problem that is an emergency. Do not wait to see if the symptoms will go away. Get medical help right away. Call your local emergency services (911 in the [place] Do not drive yourself to the hospital. Summary  Coronavirus disease, also called COVID-19, is an infection of the  lungs and airways (respiratory tract). It is unclear at this time if pregnancy makes you more susceptible to COVID-19 and what effects it may have on unborn babies.  It is important to take precautions to protect yourself and your developing baby. This includes washing your hands often, avoiding touching your mouth, face, eyes, or nose, avoiding social gatherings and travel, and staying away from people who are sick.  If you think you have a COVID-19 infection, contact your health care provider right away. Tell your health care provider that you think you may have a COVID-19 infection.  If you have or may have COVID-19, your health care provider may recommend special precautions during your pregnancy, labor and delivery, and after your baby is born. This information is not intended to replace advice given to you by your health care provider. Make sure you discuss any questions you have with your health care provider. Document Revised: 02/15/2019 Document Reviewed: 08/21/2018 Elsevier Patient Education  2020 [employer]        COVID-19 COVID-19 is a respiratory infection that is caused by a virus called severe acute respiratory syndrome coronavirus 2 (SARS-CoV-2). The disease is also known as coronavirus disease or novel coronavirus. In some people, the virus may not cause any symptoms. In others, it may cause a serious infection. The infection can get worse quickly and can lead to  complications, such as:  Pneumonia, or infection of the lungs.  Acute respiratory distress syndrome or ARDS. This is a condition in which fluid build-up in the lungs prevents the lungs from filling with air and passing oxygen into the blood.  Acute respiratory failure. This is a condition in which there is not enough oxygen passing from the lungs to the body or when carbon dioxide is not passing from the lungs out of the body.  Sepsis or septic shock. This is a serious bodily reaction to an infection.  Blood clotting problems.  Secondary infections due to bacteria or fungus.  Organ failure. This is when your body's organs stop working. The virus that causes COVID-19 is contagious. This means that it can spread from person to person through droplets from coughs and sneezes (respiratory secretions). What are the causes? This illness is caused by a virus. You may catch the virus by:  Breathing in droplets from an infected person. Droplets can be spread by a person breathing, speaking, singing, coughing, or sneezing.  Touching something, like a table or a doorknob, that was exposed to the virus (contaminated) and then touching your mouth, nose, or eyes. What increases the risk? Risk for infection You are more likely to be infected with this virus if you:  Are within 6 feet (2 meters) of a person with COVID-19.  Provide care for or live with a person who is infected with COVID-19.  Spend time in crowded indoor spaces or live in shared housing. Risk for serious illness You are more likely to become seriously ill from the virus if you:  Are 82 years of age or older. The higher your age, the more you are at risk for serious illness.  Live in a nursing home or long-term care facility.  Have cancer.  Have a long-term (chronic) disease such as: ? Chronic lung disease, including chronic obstructive pulmonary disease or asthma. ? A long-term disease that lowers your body's ability to  fight infection (immunocompromised). ? Heart disease, including heart failure, a condition in which the arteries that lead to the heart become narrow or blocked (coronary  artery disease), a disease which makes the heart muscle thick, weak, or stiff (cardiomyopathy). ? Diabetes. ? Chronic kidney disease. ? Sickle cell disease, a condition in which red blood cells have an abnormal "sickle" shape. ? Liver disease.  Are obese. What are the signs or symptoms? Symptoms of this condition can range from mild to severe. Symptoms may appear any time from 2 to 14 days after being exposed to the virus. They include:  A fever or chills.  A cough.  Difficulty breathing.  Headaches, body aches, or muscle aches.  Runny or stuffy (congested) nose.  A sore throat.  New loss of taste or smell. Some people may also have stomach problems, such as nausea, vomiting, or diarrhea. Other people may not have any symptoms of COVID-19. How is this diagnosed? This condition may be diagnosed based on:  Your signs and symptoms, especially if: ? You live in an area with a COVID-19 outbreak. ? You recently traveled to or from an area where the virus is common. ? You provide care for or live with a person who was diagnosed with COVID-19. ? You were exposed to a person who was diagnosed with COVID-19.  A physical exam.  Lab tests, which may include: ? Taking a sample of fluid from the back of your nose and throat (nasopharyngeal fluid), your nose, or your throat using a swab. ? A sample of mucus from your lungs (sputum). ? Blood tests.  Imaging tests, which may include, X-rays, CT scan, or ultrasound. How is this treated? At present, there is no medicine to treat COVID-19. Medicines that treat other diseases are being used on a trial basis to see if they are effective against COVID-19. Your health care provider will talk with you about ways to treat your symptoms. For most people, the infection is mild and  can be managed at home with rest, fluids, and over-the-counter medicines. Treatment for a serious infection usually takes places in a hospital intensive care unit (ICU). It may include one or more of the following treatments. These treatments are given until your symptoms improve.  Receiving fluids and medicines through an IV.  Supplemental oxygen. Extra oxygen is given through a tube in the nose, a face mask, or a hood.  Positioning you to lie on your stomach (prone position). This makes it easier for oxygen to get into the lungs.  Continuous positive airway pressure (CPAP) or bi-level positive airway pressure (BPAP) machine. This treatment uses mild air pressure to keep the airways open. A tube that is connected to a motor delivers oxygen to the body.  Ventilator. This treatment moves air into and out of the lungs by using a tube that is placed in your windpipe.  Tracheostomy. This is a procedure to create a hole in the neck so that a breathing tube can be inserted.  Extracorporeal membrane oxygenation (ECMO). This procedure gives the lungs a chance to recover by taking over the functions of the heart and lungs. It supplies oxygen to the body and removes carbon dioxide. Follow these instructions at home: Lifestyle  If you are sick, stay home except to get medical care. Your health care provider will tell you how long to stay home. Call your health care provider before you go for medical care.  Rest at home as told by your health care provider.  Do not use any products that contain nicotine or tobacco, such as cigarettes, e-cigarettes, and chewing tobacco. If you need help quitting, ask your health care  provider.  Return to your normal activities as told by your health care provider. Ask your health care provider what activities are safe for you. General instructions  Take over-the-counter and prescription medicines only as told by your health care provider.  Drink enough fluid to keep  your urine pale yellow.  Keep all follow-up visits as told by your health care provider. This is important. How is this prevented?  There is no vaccine to help prevent COVID-19 infection. However, there are steps you can take to protect yourself and others from this virus. To protect yourself:   Do not travel to areas where COVID-19 is a risk. The areas where COVID-19 is reported change often. To identify high-risk areas and travel restrictions, check the CDC travel website: StageSync.si  If you live in, or must travel to, an area where COVID-19 is a risk, take precautions to avoid infection. ? Stay away from people who are sick. ? Wash your hands often with soap and water for 20 seconds. If soap and water are not available, use an alcohol-based hand sanitizer. ? Avoid touching your mouth, face, eyes, or nose. ? Avoid going out in public, follow guidance from your state and local health authorities. ? If you must go out in public, wear a cloth face covering or face mask. Make sure your mask covers your nose and mouth. ? Avoid crowded indoor spaces. Stay at least 6 feet (2 meters) away from others. ? Disinfect objects and surfaces that are frequently touched every day. This may include:  Counters and tables.  Doorknobs and light switches.  Sinks and faucets.  Electronics, such as phones, remote controls, keyboards, computers, and tablets. To protect others: If you have symptoms of COVID-19, take steps to prevent the virus from spreading to others.  If you think you have a COVID-19 infection, contact your health care provider right away. Tell your health care team that you think you may have a COVID-19 infection.  Stay home. Leave your house only to seek medical care. Do not use public transport.  Do not travel while you are sick.  Wash your hands often with soap and water for 20 seconds. If soap and water are not available, use alcohol-based hand sanitizer.  Stay  away from other members of your household. Let healthy household members care for children and pets, if possible. If you have to care for children or pets, wash your hands often and wear a mask. If possible, stay in your own room, separate from others. Use a different bathroom.  Make sure that all people in your household wash their hands well and often.  Cough or sneeze into a tissue or your sleeve or elbow. Do not cough or sneeze into your hand or into the air.  Wear a cloth face covering or face mask. Make sure your mask covers your nose and mouth. Where to find more information  Centers for Disease Control and Prevention: StickerEmporium.tn  World Health Organization: https://thompson-craig.com/ Contact a health care provider if:  You live in or have traveled to an area where COVID-19 is a risk and you have symptoms of the infection.  You have had contact with someone who has COVID-19 and you have symptoms of the infection. Get help right away if:  You have trouble breathing.  You have pain or pressure in your chest.  You have confusion.  You have bluish lips and fingernails.  You have difficulty waking from sleep.  You have symptoms that get worse. These  symptoms may represent a serious problem that is an emergency. Do not wait to see if the symptoms will go away. Get medical help right away. Call your local emergency services (911 in the [place] Do not drive yourself to the hospital. Let the emergency medical personnel know if you think you have COVID-19. Summary  COVID-19 is a respiratory infection that is caused by a virus. It is also known as coronavirus disease or novel coronavirus. It can cause serious infections, such as pneumonia, acute respiratory distress syndrome, acute respiratory failure, or sepsis.  The virus that causes COVID-19 is contagious. This means that it can spread from person to person through droplets from breathing,  speaking, singing, coughing, or sneezing.  You are more likely to develop a serious illness if you are 7 years of age or older, have a weak immune system, live in a nursing home, or have chronic disease.  There is no medicine to treat COVID-19. Your health care provider will talk with you about ways to treat your symptoms.  Take steps to protect yourself and others from infection. Wash your hands often and disinfect objects and surfaces that are frequently touched every day. Stay away from people who are sick and wear a mask if you are sick. This information is not intended to replace advice given to you by your health care provider. Make sure you discuss any questions you have with your health care provider. Document Revised: 02/22/2019 Document Reviewed: 05/31/2018 Elsevier Patient Education  2020 [employer].

## 2020-05-15 ENCOUNTER — Other Ambulatory Visit: Payer: Self-pay | Admitting: Physician Assistant

## 2020-05-15 ENCOUNTER — Other Ambulatory Visit (HOSPITAL_COMMUNITY): Payer: Self-pay | Admitting: Physician Assistant

## 2020-05-15 DIAGNOSIS — R1013 Epigastric pain: Secondary | ICD-10-CM

## 2020-05-19 ENCOUNTER — Other Ambulatory Visit: Payer: Self-pay

## 2020-05-19 ENCOUNTER — Emergency Department (HOSPITAL_COMMUNITY): Payer: 59

## 2020-05-19 ENCOUNTER — Observation Stay (HOSPITAL_COMMUNITY)
Admission: EM | Admit: 2020-05-19 | Discharge: 2020-05-20 | Disposition: A | Discharge status: Home / Self Care | Payer: 59 | Attending: Internal Medicine | Admitting: Internal Medicine

## 2020-05-19 ENCOUNTER — Observation Stay (HOSPITAL_COMMUNITY)
Admission: RE | Admit: 2020-05-19 | Discharge: 2020-05-19 | Disposition: A | Discharge status: Home / Self Care | Payer: 59 | Source: Ambulatory Visit | Attending: Physician Assistant | Admitting: Physician Assistant

## 2020-05-19 ENCOUNTER — Encounter (HOSPITAL_COMMUNITY): Payer: Self-pay | Admitting: Emergency Medicine

## 2020-05-19 DIAGNOSIS — Z9884 Bariatric surgery status: Secondary | ICD-10-CM

## 2020-05-19 DIAGNOSIS — E860 Dehydration: Secondary | ICD-10-CM | POA: Diagnosis not present

## 2020-05-19 DIAGNOSIS — O209 Hemorrhage in early pregnancy, unspecified: Secondary | ICD-10-CM | POA: Diagnosis present

## 2020-05-19 DIAGNOSIS — R1013 Epigastric pain: Secondary | ICD-10-CM

## 2020-05-19 DIAGNOSIS — O99351 Diseases of the nervous system complicating pregnancy, first trimester: Secondary | ICD-10-CM | POA: Diagnosis present

## 2020-05-19 DIAGNOSIS — J45909 Unspecified asthma, uncomplicated: Secondary | ICD-10-CM | POA: Diagnosis not present

## 2020-05-19 DIAGNOSIS — I10 Essential (primary) hypertension: Secondary | ICD-10-CM | POA: Diagnosis not present

## 2020-05-19 DIAGNOSIS — E872 Acidosis, unspecified: Secondary | ICD-10-CM | POA: Diagnosis present

## 2020-05-19 DIAGNOSIS — G40409 Other generalized epilepsy and epileptic syndromes, not intractable, without status epilepticus: Secondary | ICD-10-CM | POA: Diagnosis present

## 2020-05-19 DIAGNOSIS — O99281 Endocrine, nutritional and metabolic diseases complicating pregnancy, first trimester: Secondary | ICD-10-CM | POA: Diagnosis not present

## 2020-05-19 DIAGNOSIS — K219 Gastro-esophageal reflux disease without esophagitis: Secondary | ICD-10-CM | POA: Diagnosis not present

## 2020-05-19 DIAGNOSIS — D696 Thrombocytopenia, unspecified: Secondary | ICD-10-CM | POA: Diagnosis present

## 2020-05-19 DIAGNOSIS — Z3A01 Less than 8 weeks gestation of pregnancy: Secondary | ICD-10-CM | POA: Insufficient documentation

## 2020-05-19 DIAGNOSIS — Z3490 Encounter for supervision of normal pregnancy, unspecified, unspecified trimester: Secondary | ICD-10-CM

## 2020-05-19 DIAGNOSIS — R7989 Other specified abnormal findings of blood chemistry: Secondary | ICD-10-CM | POA: Diagnosis present

## 2020-05-19 DIAGNOSIS — R569 Unspecified convulsions: Secondary | ICD-10-CM | POA: Insufficient documentation

## 2020-05-19 DIAGNOSIS — Z20822 Contact with and (suspected) exposure to covid-19: Secondary | ICD-10-CM | POA: Insufficient documentation

## 2020-05-19 DIAGNOSIS — N939 Abnormal uterine and vaginal bleeding, unspecified: Secondary | ICD-10-CM

## 2020-05-19 LAB — LACTIC ACID, PLASMA: Lactic Acid, Venous: 1 mmol/L (ref 0.5–1.9)

## 2020-05-19 LAB — URINALYSIS, ROUTINE W REFLEX MICROSCOPIC
Bilirubin Urine: NEGATIVE
Glucose, UA: NEGATIVE mg/dL
Ketones, ur: 80 mg/dL — AB
Leukocytes,Ua: NEGATIVE
Nitrite: NEGATIVE
Protein, ur: 30 mg/dL — AB
Specific Gravity, Urine: 1.027 (ref 1.005–1.030)
pH: 5 (ref 5.0–8.0)

## 2020-05-19 LAB — CBC
HCT: 42.3 % (ref 36.0–46.0)
Hemoglobin: 14.2 g/dL (ref 12.0–15.0)
MCH: 30.5 pg (ref 26.0–34.0)
MCHC: 33.6 g/dL (ref 30.0–36.0)
MCV: 91 fL (ref 80.0–100.0)
Platelets: 127 10*3/uL — ABNORMAL LOW (ref 150–400)
RBC: 4.65 MIL/uL (ref 3.87–5.11)
RDW: 12.8 % (ref 11.5–15.5)
WBC: 7.1 10*3/uL (ref 4.0–10.5)
nRBC: 0 % (ref 0.0–0.2)

## 2020-05-19 LAB — RAPID URINE DRUG SCREEN, HOSP PERFORMED
Amphetamines: NOT DETECTED
Barbiturates: NOT DETECTED
Benzodiazepines: NOT DETECTED
Cocaine: NOT DETECTED
Opiates: NOT DETECTED
Tetrahydrocannabinol: NOT DETECTED

## 2020-05-19 LAB — I-STAT VENOUS BLOOD GAS, ED
Acid-base deficit: 4 mmol/L — ABNORMAL HIGH (ref 0.0–2.0)
Bicarbonate: 18.7 mmol/L — ABNORMAL LOW (ref 20.0–28.0)
Calcium, Ion: 0.95 mmol/L — ABNORMAL LOW (ref 1.15–1.40)
HCT: 34 % — ABNORMAL LOW (ref 36.0–46.0)
Hemoglobin: 11.6 g/dL — ABNORMAL LOW (ref 12.0–15.0)
O2 Saturation: 94 %
Potassium: 3.7 mmol/L (ref 3.5–5.1)
Sodium: 143 mmol/L (ref 135–145)
TCO2: 20 mmol/L — ABNORMAL LOW (ref 22–32)
pCO2, Ven: 27.7 mmHg — ABNORMAL LOW (ref 44.0–60.0)
pH, Ven: 7.439 — ABNORMAL HIGH (ref 7.250–7.430)
pO2, Ven: 65 mmHg — ABNORMAL HIGH (ref 32.0–45.0)

## 2020-05-19 LAB — BASIC METABOLIC PANEL
Anion gap: 14 (ref 5–15)
BUN: 8 mg/dL (ref 6–20)
CO2: 19 mmol/L — ABNORMAL LOW (ref 22–32)
Calcium: 8.6 mg/dL — ABNORMAL LOW (ref 8.9–10.3)
Chloride: 103 mmol/L (ref 98–111)
Creatinine, Ser: 0.68 mg/dL (ref 0.44–1.00)
GFR, Estimated: >60 mL/min (ref >60)
Glucose, Bld: 98 mg/dL (ref 70–99)
Potassium: 4 mmol/L (ref 3.5–5.1)
Sodium: 136 mmol/L (ref 135–145)

## 2020-05-19 LAB — CK
Total CK: 59 U/L (ref 38–234)
Total CK: 52 U/L (ref 38–234)

## 2020-05-19 LAB — CBG MONITORING, ED: Glucose-Capillary: 91 mg/dL (ref 70–99)

## 2020-05-19 LAB — HEPATIC FUNCTION PANEL
ALT: 36 U/L (ref 0–44)
AST: 23 U/L (ref 15–41)
Albumin: 2.9 g/dL — ABNORMAL LOW (ref 3.5–5.0)
Alkaline Phosphatase: 50 U/L (ref 38–126)
Bilirubin, Direct: 0.2 mg/dL (ref 0.0–0.2)
Indirect Bilirubin: 0.1 mg/dL — ABNORMAL LOW (ref 0.3–0.9)
Total Bilirubin: 0.3 mg/dL (ref 0.3–1.2)
Total Protein: 5.1 g/dL — ABNORMAL LOW (ref 6.5–8.1)

## 2020-05-19 LAB — ETHANOL: Alcohol, Ethyl (B): <10 mg/dL (ref <10)

## 2020-05-19 LAB — LIPASE, BLOOD: Lipase: 59 U/L — ABNORMAL HIGH (ref 11–51)

## 2020-05-19 LAB — LACTATE DEHYDROGENASE: LDH: 223 U/L — ABNORMAL HIGH (ref 98–192)

## 2020-05-19 LAB — PROTIME-INR
INR: 1.1 (ref 0.8–1.2)
Prothrombin Time: 13.3 seconds (ref 11.4–15.2)

## 2020-05-19 LAB — MAGNESIUM: Magnesium: 1.7 mg/dL (ref 1.7–2.4)

## 2020-05-19 LAB — APTT: aPTT: 35 seconds (ref 24–36)

## 2020-05-19 MED ORDER — LAMOTRIGINE 25 MG PO TABS
25.0000 mg | ORAL_TABLET | Freq: Two times a day (BID) | ORAL | Status: DC
  Administered 2020-05-20: 25 mg via ORAL
  Filled 2020-05-19 (×3): qty 1

## 2020-05-19 MED ORDER — SODIUM CHLORIDE 0.9 % IV SOLN
750.0000 mg | Freq: Two times a day (BID) | INTRAVENOUS | Status: DC
  Administered 2020-05-20: 750 mg via INTRAVENOUS
  Filled 2020-05-19 (×4): qty 7.5

## 2020-05-19 MED ORDER — SODIUM CHLORIDE 0.9 % IV BOLUS
500.0000 mL | Freq: Once | INTRAVENOUS | Status: AC
  Administered 2020-05-19: 500 mL via INTRAVENOUS

## 2020-05-19 MED ORDER — PROMETHAZINE HCL 25 MG/ML IJ SOLN
25.0000 mg | Freq: Once | INTRAMUSCULAR | Status: AC
  Administered 2020-05-19: 25 mg via INTRAVENOUS
  Filled 2020-05-19: qty 1

## 2020-05-19 MED ORDER — ACETAMINOPHEN 325 MG PO TABS: 650.0000 mg | ORAL_TABLET | Freq: Four times a day (QID) | ORAL | Status: DC | PRN

## 2020-05-19 MED ORDER — ACETAMINOPHEN 650 MG RE SUPP: 650.0000 mg | Freq: Four times a day (QID) | RECTAL | Status: DC | PRN

## 2020-05-19 MED ORDER — LORAZEPAM 2 MG/ML IJ SOLN
INTRAMUSCULAR | Status: AC
  Administered 2020-05-19: 1 mg via INTRAVENOUS
  Filled 2020-05-19: qty 1

## 2020-05-19 MED ORDER — LAMOTRIGINE 25 MG PO TABS: ORAL_TABLET | ORAL | 0 refills | Status: DC

## 2020-05-19 MED ORDER — LAMOTRIGINE 25 MG PO TABS
25.0000 mg | ORAL_TABLET | ORAL | Status: AC
  Administered 2020-05-19: 25 mg via ORAL
  Filled 2020-05-19 (×2): qty 1

## 2020-05-19 MED ORDER — LORAZEPAM 2 MG/ML IJ SOLN: 1.0000 mg | Freq: Once | INTRAMUSCULAR | Status: AC

## 2020-05-19 MED ORDER — SODIUM CHLORIDE 0.9 % IV SOLN
2500.0000 mg | Freq: Once | INTRAVENOUS | Status: AC
  Administered 2020-05-19: 2500 mg via INTRAVENOUS
  Filled 2020-05-19 (×2): qty 25

## 2020-05-19 MED ORDER — ACETAMINOPHEN 500 MG PO TABS
500.0000 mg | ORAL_TABLET | Freq: Once | ORAL | Status: AC
  Administered 2020-05-19: 500 mg via ORAL
  Filled 2020-05-19: qty 1

## 2020-05-19 MED ORDER — MAGNESIUM SULFATE IN D5W 1-5 GM/100ML-% IV SOLN
1.0000 g | Freq: Once | INTRAVENOUS | Status: AC
  Administered 2020-05-19: 1 g via INTRAVENOUS
  Filled 2020-05-19: qty 100

## 2020-05-19 MED ORDER — SODIUM CHLORIDE 0.9 % IV SOLN
75.0000 mL/h | INTRAVENOUS | Status: AC
  Administered 2020-05-20: 75 mL/h via INTRAVENOUS

## 2020-05-19 MED ORDER — LORAZEPAM 2 MG/ML IJ SOLN
1.0000 mg | Freq: Once | INTRAMUSCULAR | Status: AC
  Administered 2020-05-19: 1 mg via INTRAVENOUS
  Filled 2020-05-19: qty 1

## 2020-05-19 MED ORDER — SODIUM CHLORIDE 0.9 % IV BOLUS
1000.0000 mL | Freq: Once | INTRAVENOUS | Status: AC
Start: 2020-05-19 — End: 2020-05-19
  Administered 2020-05-19: 1000 mL via INTRAVENOUS

## 2020-05-19 MED ORDER — LEVETIRACETAM IN NACL 500 MG/100ML IV SOLN
500.0000 mg | Freq: Two times a day (BID) | INTRAVENOUS | Status: DC
  Filled 2020-05-19: qty 100

## 2020-05-19 NOTE — ED Provider Notes (Signed)
Care assumed from PA [facility] at shift change, please see his note for full details, but in brief Brooke [name] is a 35 y.o. female with known history of myoclonic epilepsy with focal seizures, who was previously managed on Topamax but this was discontinued in the setting of pregnancy.  She had a witnessed 4 to 5-minute generalized seizure at home, fell in the bathtub during this episode, EMS was called, she had a second generalized tonic-clonic seizure lasting about 10 seconds, that was self resolved here in the emergency department.  [name] prior history of generalized seizures.  Has not currently been on any consistent seizure medication.  Patient is also estimated to be 3 to [redacted] weeks pregnant.  Was having some vaginal bleeding, had an ultrasound on 1/6 with [name] identifiable IUP, but had appropriate doubling of hCG levels, was scheduled for an ultrasound on Monday for follow-up.  Lab work thus far has been overall reassuring.  CT of the head and neck as well as ultrasound pending.  Previous provider discussed plan with patient's neurologist as well as our neurologist, Dr. Wilford Corner.  They recommended giving giving 25 mg dose of Lamictal and then starting patient on this medication twice daily.  If she returns to baseline with [name] further seizure-like activity, can be discharged home with close neurology follow-up, but if she is not returning to baseline or has additional seizure activity will require observation admission.  BP 106/61   Pulse 63   Temp 98.5 F (36.9 C) (Oral)   Resp 19   LMP 03/22/2020   SpO2 97%    ED Course/Procedures   Labs Reviewed  BASIC METABOLIC PANEL - Abnormal; Notable for the following components:      Result Value   CO2 19 (*)    Calcium 8.6 (*)    All other components within normal limits  CBC - Abnormal; Notable for the following components:   Platelets 127 (*)    All other components within normal limits  URINALYSIS, ROUTINE W REFLEX MICROSCOPIC -  Abnormal; Notable for the following components:   APPearance HAZY (*)    Hgb urine dipstick MODERATE (*)    Ketones, ur 80 (*)    Protein, ur 30 (*)    Bacteria, UA RARE (*)    All other components within normal limits  I-STAT VENOUS BLOOD GAS, ED - Abnormal; Notable for the following components:   pH, Ven 7.439 (*)    pCO2, Ven 27.7 (*)    pO2, Ven 65.0 (*)    Bicarbonate 18.7 (*)    TCO2 20 (*)    Acid-base deficit 4.0 (*)    Calcium, Ion 0.95 (*)    HCT 34.0 (*)    Hemoglobin 11.6 (*)    All other components within normal limits  SARS CORONAVIRUS 2 (TAT 6-24 HRS)  RAPID URINE DRUG SCREEN, HOSP PERFORMED  HEPATIC FUNCTION PANEL  ETHANOL  MAGNESIUM  CK  LACTIC ACID, PLASMA  BLOOD GAS, VENOUS  HAPTOGLOBIN  LACTATE DEHYDROGENASE  CBC WITH DIFFERENTIAL/PLATELET  CBG MONITORING, ED    CT Head Wo Contrast  Result Date: 05/19/2020 CLINICAL DATA:  Seizure leading to fall in bathroom striking head on side of tub. Pregnant patient in first-trimester pregnancy. EXAM: CT HEAD WITHOUT CONTRAST TECHNIQUE: Contiguous axial images were obtained from the base of the skull through the vertex without intravenous contrast. COMPARISON:  Head CT 11/14/2011 FINDINGS: Brain: [name] intracranial hemorrhage, mass effect, or midline shift. [name] hydrocephalus. The basilar cisterns are patent. [name]  evidence of territorial infarct or acute ischemia. [name] extra-axial or intracranial fluid collection. Vascular: [name] hyperdense vessel. Skull: [name] fracture or focal lesion. Sinuses/Orbits: Paranasal sinuses and mastoid air cells are clear. The visualized orbits are unremarkable. Other: Mild motion artifact but diagnostic exam. IMPRESSION: Negative head CT. Electronically Signed   By: Narda Rutherford M.D.   On: 05/19/2020 18:31   CT Cervical Spine Wo Contrast  Result Date: 05/19/2020 CLINICAL DATA:  Seizure leading to fall in bathroom striking head on side of tub. Cervical neck pain. Pregnant patient in first-trimester  pregnancy. EXAM: CT CERVICAL SPINE WITHOUT CONTRAST TECHNIQUE: Multidetector CT imaging of the cervical spine was performed without intravenous contrast. Multiplanar CT image reconstructions were also generated. COMPARISON:  None. FINDINGS: Alignment: Normal, allowing for mild head tilt on the scanner. Skull base and vertebrae: [name] acute fracture. Vertebral body heights are maintained. The dens and skull base are intact. Soft tissues and spinal canal: [name] prevertebral fluid or swelling. [name] visible canal hematoma. Disc levels: Posterior spurring at C3-C4, C5-C6, and C6-C7. Associated C5-C6 and C6-C7 disc space narrowing. Upper chest: Negative. Other: None. IMPRESSION: Degenerative change in the cervical spine without acute fracture or subluxation. Electronically Signed   By: Narda Rutherford M.D.   On: 05/19/2020 18:34   US OB LESS THAN 14 WEEKS WITH OB TRANSVAGINAL  Result Date: 05/19/2020 CLINICAL DATA:  Pregnant patient in first-trimester pregnancy with vaginal bleeding. EXAM: OBSTETRIC <14 WK [place] AND TRANSVAGINAL OB US TECHNIQUE: Both transabdominal and transvaginal ultrasound examinations were performed for complete evaluation of the gestation as well as the maternal uterus, adnexal regions, and pelvic cul-de-sac. Transvaginal technique was performed to assess early pregnancy. COMPARISON:  Obstetric ultrasound 5 days ago 05/14/2020 FINDINGS: Intrauterine gestational sac: Single Yolk sac:  Visualized. Embryo:  Not Visualized. Cardiac Activity: Not Visualized. MSD: 8.3 mm   5 w   4 d Subchorionic hemorrhage:  None visualized. Maternal uterus/adnexae: Interviewed uterus. Development of gestational sac containing a yolk sac from prior exam. Fetal pole and cardiac activity not yet demonstrated. [name] subchorionic hemorrhage. Right ovary is visualized transabdominally and unremarkable, the previous 1.5 cm complex cyst is not well seen. Complex cyst in the left ovary is again seen measuring approximately 2 cm, decreased  from 2.4 cm on prior. [name] extra ovarian adnexal mass. [name] pelvic free fluid. IMPRESSION: 1. Development of an intrauterine gestational sac and yolk sac since prior exam. Fetal pole and cardiac activity are not yet demonstrated. 2. [name] subchorionic hemorrhage. 3. Complex cyst in the left ovary has diminished in size from prior, may represent a resolving hemorrhagic cyst. Electronically Signed   By: Narda Rutherford M.D.   On: 05/19/2020 16:46    Procedures  MDM   Patient was starting to wake up and improve, but then started having myoclonic jerking of both arms, concerned that patient is not fully returning to baseline is now having focal seizure activity again in arms.  Ultrasound with IUP estimated to be 5 weeks 4 days pregnant, [name] cardiac activity visualized yet.  CT of the head and neck unremarkable.  Given that patient has not returned to baseline and is having some focal seizure activity again discussed case again with neurology, he recommends loading with 2500 mg of Keppra, patient did receive dose of Lamictal but vomited shortly after.  Agrees with plan for admission for observation, will give dose of Ativan currently to limit further seizure activity.  Neurology will see patient in consult tonight and will have epileptologist see patient  in the morning.  Does not recommend further imaging at this time.  Case discussed with Dr. Adela Glimpse with [facility] hospitalist who will see and admit the patient.  Requests LFTs, ethanol, UDS, and is concerned patient may need MRI as well.  Neurology has not yet seen the patient, she will discuss with neurology team.       Dartha Lodge, PA-C 05/19/20 2031    Charlynne Pander, MD 05/19/20 [phone]

## 2020-05-19 NOTE — ED Notes (Signed)
Dr. Wilford Corner paged to [phone] Blanchie Dessert, [place] paged by Marylene Land

## 2020-05-19 NOTE — Discharge Instructions (Signed)
Please follow-up with your neurologist in the next week. Please drink plenty of water get plenty of sleep. Please take your prescribed occasions as they are prescribed. I have prescribed you lamotrigine/Lamictal this is a antiepileptic/antiseizure medicine. Please take it as prescribed. You will be taking it 25 mg daily for 2 weeks. Your first dose was given here in the ER your next dose will be tomorrow morning. You will take 25 mg twice daily once you have completed the initial 2-week period.  May arise return to the ER for any new or concerning symptoms. If you have any seizures please return to the ER.

## 2020-05-19 NOTE — ED Triage Notes (Addendum)
Pt arrives to ED via [facility] from home after having a seizure causing her to fall in bathroom hitting her head on side of bath tub, her husband witnessed this and states it lasted about 2 minutes, also is [redacted] weeks pregnant. Hx of seizures  Takes medications as needed per husband.

## 2020-05-19 NOTE — ED Provider Notes (Addendum)
[facility] EMERGENCY DEPARTMENT Provider Note   CSN: [id number] Arrival date & time: 05/19/20  1125     History Chief Complaint  Patient presents with  . Seizures    Brooke [name] is a 35 y.o. female.  HPI Patient is a 35 year old female with past medical history significant for anxiety, myoclonic epilepsy seems to have 3-4 episodes per year usually due to lack of sleep.  Follows with neurology Rema Jasmine, MD Has been on Topamax rhythm in the past.  No Topamax recently.   Patient was brought in by EMS after a 4-5-minute seizure that occurred at approximately 10:30 AM this morning.  This was witnessed by husband.  Patient got into the bathtub and then fell and seized.  She states that she does not remember anything other than turning the water on she does not over stepping into the bathtub.  She states that she did not have any headache, lightheadedness, dizziness, chest pain, shortness of breath, abdominal pain or pelvic pain prior to the seizure.  She states that this time she has a headache.  She denies any other symptoms at this time apart from headache which she states is left-sided and achy. No aggravating or mitigating factors for her headache. She states she has been stressed and had a similar illness sleep recently. She states that her vaginal bleeding has been fairly minimal she only notices it when she wipes. She has been seen by OB/GYN and has had a repeat hCG which they felt was increasing at a normal rate. She is scheduled have an ultrasound done this upcoming Monday to confirm intrauterine pregnancy.      Past Medical History:  Diagnosis Date  . Alopecia    wears hair topper - ok to wear during 02/16/17 surgery per Dr Sherron Ales  . Anxiety    no meds  . 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  Marland Kitchen GERD (gastroesophageal reflux disease)   . Hypertension     Resolved -Hx with 2016 pregnancy only  . Migraines    last one last week 01/2017-  . PCOS (polycystic ovarian syndrome)   . Polycystic disease, ovaries   . SVD (spontaneous vaginal delivery) 2016   x 1    Patient Active Problem List   Diagnosis Date Noted  . Postpartum care following vaginal delivery (5/16) 09/23/2014  . [redacted] weeks gestation of pregnancy   . [redacted] weeks gestation of pregnancy   . [redacted] weeks gestation of pregnancy   . Non-reactive NST (non-stress test)   . Cervical insufficiency during pregnancy, antepartum   . [redacted] weeks gestation of pregnancy   . [redacted] weeks gestation of pregnancy   . Encounter for fetal anatomic survey   . Obesity affecting pregnancy, antepartum   . [redacted] weeks gestation of pregnancy   . Cervical insufficiency during pregnancy in second trimester, antepartum   . Amniotic fluid leaking   . Abdominal pain in pregnancy   . [redacted] weeks gestation of pregnancy   . Cervical incompetence during pregnancy in second trimester 07/10/2014  . [redacted] weeks gestation of pregnancy   . Cervical cerclage suture present in second trimester   . Pregnancy resulting from assisted reproductive technology   . Cervical incompetence 06/26/2014  . 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 SINUSITIS, ACUTE 06/04/2007  . COUGH 05/09/2007  . HYPERTENSION 05/08/2007  . ALLERGIC RHINITIS  05/08/2007  . ASTHMA 05/08/2007  . G E R D 05/08/2007    Past Surgical History:  Procedure Laterality Date  . CERVICAL CERCLAGE N/A 06/26/2014   Procedure: CERCLAGE CERVICAL;  Surgeon: Lenoard Adenichard J Taavon, MD;  Location: [facility];  Service: Gynecology;  Laterality: N/A;  . DILATATION & CURETTAGE/HYSTEROSCOPY WITH MYOSURE N/A 02/16/2017   Procedure: DILATATION & CURETTAGE/HYSTEROSCOPY WITH MYOSURE RESECTION OF ENDOMETRIAL POLYP;  Surgeon: Olivia Mackieaavon, Richard, MD;  Location: [facility];  Service: Gynecology;  Laterality: N/A;  . 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 TONSILLECTOMY    .  tubes in ears     as child  . WISDOM TOOTH EXTRACTION       OB History    Gravida  2   Para  1   Term      Preterm  1   AB      Living  1     SAB      IAB      Ectopic      Multiple  0   Live Births  1           Family History  Problem Relation Age of Onset  . Diabetes Mother   . Hypertension Mother   . Hypertension Father   . Alcohol abuse Maternal Grandmother   . Hearing loss Paternal Grandfather     Social History   Tobacco Use  . Smoking status: Never Smoker  . Smokeless tobacco: Never Used  Vaping Use  . Vaping Use: Never used  Substance Use Topics  . Alcohol use: No  . Drug use: No    Home Medications Prior to Admission medications   Medication Sig Start Date End Date Taking? Authorizing Provider  lamoTRIgine (LAMICTAL) 25 MG tablet Take 1 tablet (25 mg total) by mouth daily for 14 days, THEN 1 tablet (25 mg total) 2 (two) times daily. 06/01/20 07/15/20 Yes Sharman Garrott S, PA  metroNIDAZOLE (METROGEL) 0.75 % vaginal gel Place 1 Applicatorful vaginally at bedtime. 05/12/20  Yes [provider]  Multiple Vitamin (MULTIVITAMIN) tablet Take 1 tablet by mouth daily.   Yes [provider]  pantoprazole (PROTONIX) 40 MG tablet Take 40 mg by mouth daily. 05/12/20  Yes [provider]    Allergies    Patient has no known allergies.  Review of Systems   Review of Systems  Constitutional: Negative for chills and fever.  HENT: Negative for congestion.   Eyes: Negative for pain.  Respiratory: Negative for cough and shortness of breath.   Cardiovascular: Negative for chest pain and leg swelling.  Gastrointestinal: Negative for abdominal pain, diarrhea, nausea and vomiting.  Genitourinary: Negative for dysuria.  Musculoskeletal: Negative for myalgias.  Skin: Negative for rash.  Neurological: Positive for seizures and headaches. Negative for dizziness.    Physical Exam Updated Vital Signs BP 126/81 (BP Location: Right Arm)    Pulse 77   Temp 98.5 F (36.9 C) (Oral)   Resp 18   LMP 03/22/2020   SpO2 98%   Physical Exam Vitals and nursing note reviewed.  Constitutional:      General: She is not in acute distress.    Appearance: She is obese.     Comments: Pleasant well-appearing [age] year old.  In no acute distress.  Sitting comfortably in bed.  Able answer questions appropriately follow commands. No increased work of breathing. Speaking in full sentences.  HENT:     Head: Normocephalic.     Comments: Diffuse tenderness to palpation of the  cranium. Some bruising of the face specifically left upper eyelid has small amount of bruising. EOMI with no evidence of entrapment.    Nose: Nose normal.  Eyes:     General: No scleral icterus. Cardiovascular:     Rate and Rhythm: Normal rate and regular rhythm.     Pulses: Normal pulses.     Heart sounds: Normal heart sounds.  Pulmonary:     Effort: Pulmonary effort is normal. No respiratory distress.     Breath sounds: No wheezing.  Abdominal:     Palpations: Abdomen is soft.     Tenderness: There is no abdominal tenderness. There is no guarding or rebound.     Comments: Soft nontender abdomen.  Musculoskeletal:     Cervical back: Normal range of motion.     Right lower leg: No edema.     Left lower leg: No edema.     Comments: TTP of the midline C-spine  No bony tenderness over joints or long bones of the upper and lower extremities.   No back midline tenderness, step-off, deformity, or bruising.   Full range of motion of upper and lower extremity joints shown after palpation was conducted; with 5/5 symmetrical strength in upper and lower extremities. No chest wall tenderness, no facial or cranial tenderness.   Patient has intact sensation grossly in lower and upper extremities. Intact patellar and ankle reflexes. Patient able to ambulate without difficulty.  Radial and DP pulses palpated BL.   Skin:    General: Skin is warm and dry.     Capillary  Refill: Capillary refill takes less than 2 seconds.  Neurological:     Mental Status: She is alert. Mental status is at baseline.     Comments: Slow to speak and somewhat fatigued appearing however alert and oriented to self, place, time and event.   Speech is fluent, clear without dysarthria or dysphasia.   Strength 5/5 in upper/lower extremities  Sensation intact in upper/lower extremities   Normal finger-to-nose and feet tapping.  CN I not tested  CN II grossly intact visual fields bilaterally. Did not visualize posterior eye.   CN III, IV, VI PERRLA and EOMs intact bilaterally  CN V Intact sensation to sharp and light touch to the face  CN VII facial movements symmetric  CN VIII not tested  CN IX, X no uvula deviation, symmetric rise of soft palate  CN XI 5/5 SCM and trapezius strength bilaterally  CN XII Midline tongue protrusion, symmetric L/R movements   Psychiatric:        Mood and Affect: Mood normal.        Behavior: Behavior normal.     ED Results / Procedures / Treatments   Labs (all labs ordered are listed, but only abnormal results are displayed) Labs Reviewed  BASIC METABOLIC PANEL - Abnormal; Notable for the following components:      Result Value   CO2 19 (*)    Calcium 8.6 (*)    All other components within normal limits  CBC - Abnormal; Notable for the following components:   Platelets 127 (*)    All other components within normal limits  URINALYSIS, ROUTINE W REFLEX MICROSCOPIC  CBG MONITORING, ED    EKG None  Radiology US Abdomen Limited RUQ (LIVER/GB)  Result Date: 05/19/2020 CLINICAL DATA:  Right upper quadrant abdominal pain EXAM: ULTRASOUND ABDOMEN LIMITED RIGHT UPPER QUADRANT COMPARISON:  Ultrasound abdomen complete 09/20/2012 FINDINGS: Gallbladder: No gallstones or wall thickening visualized. No sonographic Murphy sign  noted by sonographer. Common bile duct: Diameter: 4 Liver: No focal lesion identified. Within normal limits in parenchymal  echogenicity. Portal vein is patent on color Doppler imaging with normal direction of blood flow towards the liver. Other: None. IMPRESSION: Unremarkable right upper quadrant ultrasound Electronically Signed   By: Acquanetta Belling M.D.   On: 05/19/2020 09:45    Procedures Procedures (including critical care time)  Medications Ordered in ED Medications  lamoTRIgine (LAMICTAL) tablet 25 mg (has no administration in time range)  sodium chloride 0.9 % bolus 500 mL (0 mLs Intravenous Stopped 05/19/20 1538)  acetaminophen (TYLENOL) tablet 500 mg (500 mg Oral Given 05/19/20 1435)  LORazepam (ATIVAN) injection 1 mg (1 mg Intravenous Given 05/19/20 1443)    ED Course  I have reviewed the triage vital signs and the nursing notes.  Pertinent labs & imaging results that were available during my care of the patient were reviewed by me and considered in my medical decision making (see chart for details).    MDM Rules/Calculators/A&P                          Patient is a 35 year old female with past medical history detailed above she is currently 6-[redacted] weeks pregnant is being monitored for concern for ectopic pregnancy. She had an ultrasound done 1/6 which did not show an intrauterine pregnancy she is scheduled to have a another ultrasound done on Monday with her OB/GYN as she continues to have some vaginal bleeding. She denies any abdominal pain. She also did have a seizure today which is what brought her to the ER today. She had approximately 4-minute seizure which was witnessed by her husband. She has apparently had myoclonic seizures in the past and this is her first tonic-clonic seizure.  On my evaluation patient does have some bruising of the face. Is mildly postictal/fatigued. She is oriented and able to answer questions and follow commands. Neuro exam is unremarkable.  I ordered CT head, OB ultrasound rule out ectopic and CBC BMP EKG and UA  CBC without leukocytosis or anemia. Mild thrombocytopenia  which has been present on her last few blood draws. Seems that it was not present on her blood work done 3 years ago. BMP without any significant electrolyte derangements no hyponatremia or other electrolyte derangements. POC glucose 91. Vital signs within normal limits.  Discussed case with my attending physician Dr. Freida Busman as well as patient's MD Rema Jasmine  Recommendation was lamotrigine 25 mg once daily for 2 weeks then 25mg  BID from then on.   Patient did have one approximately 10-second seizure while in ER. She was no longer seizing when I arrived at bedside. He is postictal. And seems to be recovering now. Given 1 mg ativan.  Discussed with Dr. who agrees this is a reasonable discharge plan (lamotrigene) and is aware of seizure.   Patient handed off to [facility] who will follow-up on CT head and ultrasound and UA. Likely disposition is discharge home with close follow-up with neurology. If does not return to baseline may require admission  Final Clinical Impression(s) / ED Diagnoses Final diagnoses:  Seizure [facility])    Rx / DC Orders ED Discharge Orders         Ordered    lamoTRIgine (LAMICTAL) 25 MG tablet        05/19/20 1530           07/17/20 Grant, DOLE 05/19/20 1544  Gailen ShelterFondaw, Kaizen Ibsen S, [place] 05/20/20 1018    Lorre NickAllen, Anthony, MD 05/22/20 [phone]

## 2020-05-19 NOTE — ED Notes (Signed)
Lab to add on urine rapid drug screen 

## 2020-05-19 NOTE — H&P (Signed)
Brooke [name] WJX:[id number] DOB: [birth year] DOA: 05/19/2020     PCP: Kandyce Rud, MD   Outpatient Specialists:     NEurology  Used to see someone but does not go back    Patient arrived to ER on 05/19/20 at 1125 Referred by Attending Charlynne Pander, MD   Patient coming from: home Lives  With family    Chief Complaint:   Chief Complaint  Patient presents with  . Seizures    HPI: Brooke [name] is a 35 y.o. female with medical history significant of epilepsy myoclonic, GERD, HTN in pregnancy, migraines, PCOS, Had gastric bypass  0ctober 2021      Presented with   having a seizure today that caused her to fall in the bathroom she hit her head on the side of the bathtub this occurred at 10:30 AM was witnessed by her husband Apparently she has been having a very rough few days, decreased p.o. intake, chills runny nose patient felt very cold after returning home from getting ultrasound done for her new diagnosis of pregnancy.  To warm herself up she got into the bathtub turn on the water and then her husband heard a lot of commotion he ran in there and found her having a seizure facedown in the bathtub may have had a fall followed by seizure versus a seizure that caused her fall it was unclear. Seizure lasted about 2 -4 minutes patient is currently [redacted] weeks pregnant She has known history of seizure disorder but only takes Topamax when needed and has not had Topamax for quite some time.  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  Patient had a neurologist in the past but did not get along with them so she stopped going.  Otherwise denies any headache lightheadedness dizziness chest pain shortness of breath Patient is currently [redacted] weeks pregnant she have had hCG G levels has been rising but still has some mild vaginal spotting She was seen initially in emergency department plan for discharge to home with Lamictal Then patient  had another seizure while in emergency department lasting about 10 seconds but was a bit postictal  Of note on the fifth patient presented to [facility] complaining of upper abdominal pain And vaginal bleeding. She was noted to have mildly elevated LFTs and thrombocytopenia  Had Gastric bi pass at [facility] had obstruction few day after procedure Have had hard time keeping up with fluids  Have not been eating regularly Had a runny nose chills she is not vaccinated for COVID  Infectious risk factors:  Reports URI symptoms,  severe fatigue      Has not been vaccinated against COVID    Initial COVID TEST   in house  PCR testing  Pending  Lab Results  Component Value Date   SARSCOV2NAA NEGATIVE 05/14/2020     Regarding pertinent Chronic problems:      HTN on only while pregnant       Asthma -well controlled     History of seizure disorder not compliant with home meds  History of recent gastric bypass done at [facility] 3 months ago resulting in significant weight loss  While in ER: Patient had recurrent episode of tonic-clonic seizure activity while in emergency department If somewhat prolonged postictal state  ER Provider Called:   Neurology   Dr. Wilford Corner They Recommend admit to medicine   given new onset of Tonic-clonic seizure change from baseline She was doing better at  first but continued to have myoclonic jerks Neurology rec ativan PRN and loading dose of KEppra  And continue keppra 750 mg IV BID Lamictal 25 mg oral BID  Will see   in ER Was followed by ObGYN  Confirmed by [place] today intrauterine   Hospitalist was called for admission for recurrent tonic-clonic seizures Elevated LFTs  The following Work up has been ordered so far:  Orders Placed This Encounter  Procedures  . SARS CORONAVIRUS 2 (TAT 6-24 HRS) Nasopharyngeal Nasopharyngeal Swab  . CT Head Wo Contrast  . CT Cervical Spine Wo Contrast  . US OB LESS THAN 14 WEEKS WITH OB TRANSVAGINAL  . Basic metabolic panel  .  CBC  . Urinalysis, Routine w reflex microscopic  . Hepatic function panel  . Orthostatic vital signs  . Consult to neurology  ALL PATIENTS BEING ADMITTED/HAVING PROCEDURES NEED COVID-19 SCREENING  . Consult to neurology  ALL PATIENTS BEING ADMITTED/HAVING PROCEDURES NEED COVID-19 SCREENING  . Consult for Unassigned Medical Admission  ALL PATIENTS BEING ADMITTED/HAVING PROCEDURES NEED COVID-19 SCREENING  . Airborne and Contact precautions  . POC CBG, ED  . ED EKG    Following Medications were ordered in ER: Medications  levETIRAcetam (KEPPRA) 2,500 mg in sodium chloride 0.9 % 250 mL IVPB (2,500 mg Intravenous New Bag/Given 05/19/20 1900)  sodium chloride 0.9 % bolus 500 mL (0 mLs Intravenous Stopped 05/19/20 1538)  acetaminophen (TYLENOL) tablet 500 mg (500 mg Oral Given 05/19/20 1435)  LORazepam (ATIVAN) injection 1 mg (1 mg Intravenous Given 05/19/20 1443)  lamoTRIgine (LAMICTAL) tablet 25 mg (25 mg Oral Given 05/19/20 1644)  promethazine (PHENERGAN) injection 25 mg (25 mg Intravenous Given 05/19/20 1729)  LORazepam (ATIVAN) injection 1 mg (1 mg Intravenous Given 05/19/20 1820)  sodium chloride 0.9 % bolus 1,000 mL (1,000 mLs Intravenous New Bag/Given 05/19/20 1829)     Significant initial  Findings: Abnormal Labs Reviewed  BASIC METABOLIC PANEL - Abnormal; Notable for the following components:      Result Value   CO2 19 (*)    Calcium 8.6 (*)    All other components within normal limits  CBC - Abnormal; Notable for the following components:   Platelets 127 (*)    All other components within normal limits  URINALYSIS, ROUTINE W REFLEX MICROSCOPIC - Abnormal; Notable for the following components:   APPearance HAZY (*)    Hgb urine dipstick MODERATE (*)    Ketones, ur 80 (*)    Protein, ur 30 (*)    Bacteria, UA RARE (*)    All other components within normal limits    Otherwise labs showing:   Recent Labs  Lab 05/14/20 1139 05/19/20 1225  NA 136 136  K 4.7 4.0  CO2 24 19*   GLUCOSE 91 98  BUN 10 8  CREATININE 0.73 0.68  CALCIUM 8.5* 8.6*    Cr   stable,    Lab Results  Component Value Date   CREATININE 0.68 05/19/2020   CREATININE 0.73 05/14/2020   CREATININE 0.63 09/26/2014    Recent Labs  Lab 05/14/20 1139 05/19/20 2000  AST 42* 23  ALT 71* 36  ALKPHOS 61 50  BILITOT 0.8 0.3  PROT 6.4* 5.1*  ALBUMIN 3.6 2.9*   Lab Results  Component Value Date   CALCIUM 8.6 (L) 05/19/2020     WBC      Component Value Date/Time   WBC 7.1 05/19/2020 1225   LYMPHSABS 1.6 05/14/2020 1139   MONOABS 0.3 05/14/2020 1139  EOSABS 0.0 05/14/2020 1139   BASOSABS 0.0 05/14/2020 1139     Plt: Lab Results  Component Value Date   PLT 127 (L) 05/19/2020     Lactic Acid, Venous    Component Value Date/Time   LATICACIDVEN 1.0 05/19/2020 2000    COVID-19 Labs  Recent Labs    05/19/20 2000  LDH 223*    Lab Results  Component Value Date   SARSCOV2NAA NEGATIVE 05/14/2020     VBG 7.439/27.7  HG/HCT stable,     Component Value Date/Time   HGB 14.2 05/19/2020 1225   HCT 42.3 05/19/2020 1225   MCV 91.0 05/19/2020 1225    Recent Labs  Lab 05/19/20 2000  LIPASE 59*   No results for input(s): AMMONIA in the last 168 hours.     Cardiac Panel (last 3 results) Recent Labs    05/19/20 2000 05/19/20 2058  CKTOTAL 59 52      ECG: Ordered Personally reviewed by me showing: HR : 70  Rhythm:  NSR    no evidence of ischemic changes QTC 468    UA  no evidence of UTI      Urine analysis:    Component Value Date/Time   COLORURINE YELLOW 05/19/2020 1736   APPEARANCEUR HAZY (A) 05/19/2020 1736   LABSPEC 1.027 05/19/2020 1736   PHURINE 5.0 05/19/2020 1736   GLUCOSEU NEGATIVE 05/19/2020 1736   HGBUR MODERATE (A) 05/19/2020 1736   BILIRUBINUR NEGATIVE 05/19/2020 1736   KETONESUR 80 (A) 05/19/2020 1736   PROTEINUR 30 (A) 05/19/2020 1736   UROBILINOGEN 0.2 08/14/2014 2130   NITRITE NEGATIVE 05/19/2020 1736   LEUKOCYTESUR NEGATIVE  05/19/2020 1736      Ordered  CT HEAD  NON acute     US Intrauterine gestational sac: Single MSD: 8.3 mm   5 w   4 d    ED Triage Vitals [05/19/20 1144]  Enc Vitals Group     BP 127/79     Pulse Rate 71     Resp 17     Temp 98.5 F (36.9 C)     Temp Source Oral     SpO2 95 %     Weight      Height      Head Circumference      Peak Flow      Pain Score      Pain Loc      Pain Edu?      Excl. in GC?   ZOXW(96)@TMAX(24)@       Latest  Blood pressure (!) 92/56, pulse 66, temperature 98.5 F (36.9 C), temperature source Oral, resp. rate 18, last menstrual period 03/22/2020, SpO2 97 %.      Review of Systems:    Pertinent positives include: seizure  confusion  Constitutional:  No weight loss, night sweats, Fevers, chills, fatigue, weight loss  HEENT:  No headaches, Difficulty swallowing,Tooth/dental problems,Sore throat,  No sneezing, itching, ear ache, nasal congestion, post nasal drip,  Cardio-vascular:  No chest pain, Orthopnea, PND, anasarca, dizziness, palpitations.no Bilateral lower extremity swelling  GI:  No heartburn, indigestion, abdominal pain, nausea, vomiting, diarrhea, change in bowel habits, loss of appetite, melena, blood in stool, hematemesis Resp:  no shortness of breath at rest. No dyspnea on exertion, No excess mucus, no productive cough, No non-productive cough, No coughing up of blood.No change in color of mucus.No wheezing. Skin:  no rash or lesions. No jaundice GU:  no dysuria, change in color of urine, no urgency or frequency.  No straining to urinate.  No flank pain.  Musculoskeletal:  No joint pain or no joint swelling. No decreased range of motion. No back pain.  Psych:  No change in mood or affect. No depression or anxiety. No memory loss.  Neuro: no localizing neurological complaints, no tingling, no weakness, no double vision, no gait abnormality, no slurred speech, no  All systems reviewed and apart from HOPI all are negative  Past  Medical History:   Past Medical History:  Diagnosis Date  . Alopecia    wears hair topper - ok to wear during 02/16/17 surgery per Dr Sherron Ales  . Anxiety    no meds  . 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  Marland Kitchen GERD (gastroesophageal reflux disease)   . Hypertension    Resolved -Hx with 2016 pregnancy only  . Migraines    last one last week 01/2017-  . PCOS (polycystic ovarian syndrome)   . Polycystic disease, ovaries   . SVD (spontaneous vaginal delivery) 2016   x 1     Past Surgical History:  Procedure Laterality Date  . CERVICAL CERCLAGE N/A 06/26/2014   Procedure: CERCLAGE CERVICAL;  Surgeon: Lenoard Aden, MD;  Location: [facility];  Service: Gynecology;  Laterality: N/A;  . DILATATION & CURETTAGE/HYSTEROSCOPY WITH MYOSURE N/A 02/16/2017   Procedure: DILATATION & CURETTAGE/HYSTEROSCOPY WITH MYOSURE RESECTION OF ENDOMETRIAL POLYP;  Surgeon: Olivia Mackie, MD;  Location: [facility];  Service: Gynecology;  Laterality: N/A;  . 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 TONSILLECTOMY    . tubes in ears     as child  . WISDOM TOOTH EXTRACTION      Social History:  Ambulatory  Independently      reports that she has never smoked. She has never used smokeless tobacco. She reports that she does not drink alcohol and does not use drugs.   Family History:   Family History  Problem Relation Age of Onset  . Diabetes Mother   . Hypertension Mother   . Hypertension Father   . Alcohol abuse Maternal Grandmother   . Hearing loss Paternal Grandfather     Allergies: No Known Allergies   Prior to Admission medications   Medication Sig Start Date End Date Taking? Authorizing Provider  lamoTRIgine (LAMICTAL) 25 MG tablet Take 1 tablet (25 mg total) by mouth daily for 14 days, THEN 1 tablet (25 mg total) 2 (two) times daily. 06/01/20 07/15/20 Yes Fondaw, Wylder S, PA  metroNIDAZOLE (METROGEL) 0.75 % vaginal gel Place 1  Applicatorful vaginally at bedtime. 05/12/20  Yes [provider]  Multiple Vitamin (MULTIVITAMIN) tablet Take 1 tablet by mouth daily.   Yes [provider]  pantoprazole (PROTONIX) 40 MG tablet Take 40 mg by mouth daily. 05/12/20  Yes [provider]   Physical Exam: Vitals with BMI 05/19/2020 05/19/2020 05/19/2020  Height - - -  Weight - - -  BMI - - -  Systolic 92 119 97  Diastolic 56 62 59  Pulse 66 66 62   1. General:  in No Acute distress   Chronically ill -appearing 2. Psychological somnolent not Oriented but follows basic commands with delay 3. Head/ENT: Dry Mucous Membranes                          Head Non traumatic, neck supple  Poor Dentition 4. SKIN:   decreased Skin turgor,  Skin clean Dry and intact no rash 5. Heart: Regular rate and rhythm no  Murmur, no Rub or gallop 6. Lungs:  no wheezes or crackles   7. Abdomen: Soft,  non-tender, Non distended bowel sounds present 8. Lower extremities: no clubbing, cyanosis, no edema 9. Neurologically Grossly intact, moving all 4 extremities equally   10. MSK: Normal range of motion  All other LABS:    Recent Labs  Lab 05/14/20 1139 05/19/20 1225  WBC 3.1*  2.9* 7.1  NEUTROABS 1.2*  --   HGB 14.5  14.5 14.2  HCT 43.2  41.3 42.3  MCV 91.5  90.8 91.0  PLT 121*  111* 127*     Recent Labs  Lab 05/14/20 1139 05/19/20 1225  NA 136 136  K 4.7 4.0  CL 102 103  CO2 24 19*  GLUCOSE 91 98  BUN 10 8  CREATININE 0.73 0.68  CALCIUM 8.5* 8.6*     Recent Labs  Lab 05/14/20 1139  AST 42*  ALT 71*  ALKPHOS 61  BILITOT 0.8  PROT 6.4*  ALBUMIN 3.6      Cultures:    Component Value Date/Time   SDES VAGINAL/RECTAL 08/27/2014 1035   SPECREQUEST NONE 08/27/2014 1035   CULT  08/27/2014 1035    NO GROUP B STREP (S.AGALACTIAE) ISOLATED [facility]    REPTSTATUS 08/28/2014 FINAL 08/27/2014 1035     Radiological Exams on Admission: CT Head Wo  Contrast  Result Date: 05/19/2020 CLINICAL DATA:  Seizure leading to fall in bathroom striking head on side of tub. Pregnant patient in first-trimester pregnancy. EXAM: CT HEAD WITHOUT CONTRAST TECHNIQUE: Contiguous axial images were obtained from the base of the skull through the vertex without intravenous contrast. COMPARISON:  Head CT 11/14/2011 FINDINGS: Brain: No intracranial hemorrhage, mass effect, or midline shift. No hydrocephalus. The basilar cisterns are patent. No evidence of territorial infarct or acute ischemia. No extra-axial or intracranial fluid collection. Vascular: No hyperdense vessel. Skull: No fracture or focal lesion. Sinuses/Orbits: Paranasal sinuses and mastoid air cells are clear. The visualized orbits are unremarkable. Other: Mild motion artifact but diagnostic exam. IMPRESSION: Negative head CT. Electronically Signed   By: Narda RutherfordMelanie  Sanford M.D.   On: 05/19/2020 18:31   CT Cervical Spine Wo Contrast  Result Date: 05/19/2020 CLINICAL DATA:  Seizure leading to fall in bathroom striking head on side of tub. Cervical neck pain. Pregnant patient in first-trimester pregnancy. EXAM: CT CERVICAL SPINE WITHOUT CONTRAST TECHNIQUE: Multidetector CT imaging of the cervical spine was performed without intravenous contrast. Multiplanar CT image reconstructions were also generated. COMPARISON:  None. FINDINGS: Alignment: Normal, allowing for mild head tilt on the scanner. Skull base and vertebrae: No acute fracture. Vertebral body heights are maintained. The dens and skull base are intact. Soft tissues and spinal canal: No prevertebral fluid or swelling. No visible canal hematoma. Disc levels: Posterior spurring at C3-C4, C5-C6, and C6-C7. Associated C5-C6 and C6-C7 disc space narrowing. Upper chest: Negative. Other: None. IMPRESSION: Degenerative change in the cervical spine without acute fracture or subluxation. Electronically Signed   By: Narda RutherfordMelanie  Sanford M.D.   On: 05/19/2020 18:34   US OB  LESS THAN 14 WEEKS WITH OB TRANSVAGINAL  Result Date: 05/19/2020 CLINICAL DATA:  Pregnant patient in first-trimester pregnancy with vaginal bleeding. EXAM: OBSTETRIC <14 WK US AND TRANSVAGINAL OB US TECHNIQUE: Both transabdominal and transvaginal ultrasound examinations were performed for complete evaluation of the gestation as  well as the maternal uterus, adnexal regions, and pelvic cul-de-sac. Transvaginal technique was performed to assess early pregnancy. COMPARISON:  Obstetric ultrasound 5 days ago 05/14/2020 FINDINGS: Intrauterine gestational sac: Single Yolk sac:  Visualized. Embryo:  Not Visualized. Cardiac Activity: Not Visualized. MSD: 8.3 mm   5 w   4 d Subchorionic hemorrhage:  None visualized. Maternal uterus/adnexae: Interviewed uterus. Development of gestational sac containing a yolk sac from prior exam. Fetal pole and cardiac activity not yet demonstrated. No subchorionic hemorrhage. Right ovary is visualized transabdominally and unremarkable, the previous 1.5 cm complex cyst is not well seen. Complex cyst in the left ovary is again seen measuring approximately 2 cm, decreased from 2.4 cm on prior. No extra ovarian adnexal mass. No pelvic free fluid. IMPRESSION: 1. Development of an intrauterine gestational sac and yolk sac since prior exam. Fetal pole and cardiac activity are not yet demonstrated. 2. No subchorionic hemorrhage. 3. Complex cyst in the left ovary has diminished in size from prior, may represent a resolving hemorrhagic cyst. Electronically Signed   By: Narda Rutherford M.D.   On: 05/19/2020 16:46   US Abdomen Limited RUQ (LIVER/GB)  Result Date: 05/19/2020 CLINICAL DATA:  Right upper quadrant abdominal pain EXAM: ULTRASOUND ABDOMEN LIMITED RIGHT UPPER QUADRANT COMPARISON:  Ultrasound abdomen complete 09/20/2012 FINDINGS: Gallbladder: No gallstones or wall thickening visualized. No sonographic Murphy sign noted by sonographer. Common bile duct: Diameter: 4 Liver: No focal lesion  identified. Within normal limits in parenchymal echogenicity. Portal vein is patent on color Doppler imaging with normal direction of blood flow towards the liver. Other: None. IMPRESSION: Unremarkable right upper quadrant ultrasound Electronically Signed   By: Acquanetta Belling M.D.   On: 05/19/2020 09:45    Chart has been reviewed    Assessment/Plan  35 y.o. female with medical history significant of epilepsy myoclonic, GERD, HTN in pregnancy, migraines, PCOS, Had gastric bypass  0ctober 2021   Admitted for seizure tonic- clonic seizure and dehydration with ketoacidosis  Present on Admission: . Tonic clonic convulsion (HCC) -appreciate neurology consult  continue Lamictal 25 twice daily ED Keppra of 750 twice daily will be evaluated by seizure specialist in a.m. initial CT imaging unremarkable cause for seizure most likely poor p.o. intake and noncompliance with home medications  . G E R D -chronic   . Asthma -chronic stable albuterol as needed .  Marland Kitchen Vaginal bleeding affecting early pregnancy -follow-up as an outpatient with OB/GYN at this point pregnancy measuring 5 weeks 4 days  . Metabolic acidosis -check lactic acid noted to be WNL Evidence of ketosis in the urine Suspect poor PO intake  Recent gastric bypass Check beta hydroxybuteric acid Nutritional consult Resume diet when able to tolerate Rehydrate Check thiamin and electrolytes and administer IV thiamin prior to glucose May need D5 1/2 infusion Possible starvation ketoacidosis Needs close follow-up with her gastric bypass team  . Elevated LFTs -resolved likely secondary to dehydration  . Thrombocytopenia (HCC) -continue to follow if persists may need further investigation check haptoglobin LDH  Dehydration will rehydrate and follow  Mildly elevated lipase likely in the setting of dehydration no current epigastric pain if become symptomatic or lipase significantly increase we will need to consider imaging Repeat in  a.m.  Other plan as per orders.  DVT prophylaxis:  SCD  Code Status:    Code Status: Prior FULL CODE  as per family  I had personally discussed CODE STATUS with patient     Family Communication:   Family  at  Bedside  plan of care was discussed  with  Husband,   Disposition Plan:      To home once workup is complete and patient is stable   Following barriers for discharge:                            Electrolytes corrected                                                        Will need consultants to evaluate patient prior to discharge                       Nutrition    consulted                                   Consults called: neurology   Admission status:  ED Disposition    ED Disposition Condition Comment   Admit  Hospital Area: [facility] [[id number]]  Level of Care: Telemetry Medical [104]  I expect the patient will be discharged within 24 hours: No (not a candidate for 5C-Observation unit)  Covid Evaluation: Asymptomatic Screening Protocol (No Symptoms)  Diagnosis: Tonic clonic convulsion (HCC) [709628]  Admitting Physician: Therisa Doyne [[id number]]  Attending Physician: Therisa Doyne [[id number]]        Obs    Level of care    tele  For  24H      Lab Results  Component Value Date   SARSCOV2NAA NEGATIVE 05/14/2020     Precautions: admitted as PUI   Airborne and Contact precautions     PPE: Used by the provider:   P100  eye Goggles,  Gloves    Madason Rauls 05/20/2020, 1:49 AM    [facility]     after 2 AM please page floor coverage PA If 7AM-7PM, please contact the day team taking care of the patient using [email or web address]   Patient was evaluated in the context of the global COVID-19 pandemic, which necessitated consideration that the patient might be at risk for infection with the SARS-CoV-2 virus that causes COVID-19. Institutional protocols and algorithms that pertain to the evaluation of patients at risk for COVID-19  are in a state of rapid change based on information released by regulatory bodies including the CDC and federal and state organizations. These policies and algorithms were followed during the patient's care.

## 2020-05-19 NOTE — Consult Note (Signed)
Neurology Consultation Reason for Consult: JME w/ breakthrough seizure Requesting Physician: Therisa Doyne  CC: Seizure  History is obtained from: Patient, mother at bedside and chart review  HPI: Brooke [name] is a 35 y.o. 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  She was at home this morning taking a bath when she had a generalized tonic-clonic seizure and fell face forward, striking her left forehead and eye.  Likely her husband was home.  Per family's report the event lasted several minutes.  She then had a second generalized tonic-clonic event lasting about 10 seconds in the emergency department, as well as severe ongoing myoclonic jerking.  Dr. Jerrell Belfast made initial recommendations for a loading dose of Keppra.  By the time I evaluated the patient she had received this medication and was quite sleepy but her movements had dramatically improved.  Regarding her recent gastric bypass, this was complicated by partial obstruction of the Roux limb which was treated conservatively with NG tube placement on 9/29, with patient discharged home on 02/11/2020  Regarding her seizure history, she has been followed by Dr. Mignon Pine (initially at [facility] neurology in 2013 then at [facility] in 2016).  He has an excellent note from 11/07/2014 which details her history thoroughly.  In brief: Seizures started at age 62 with isolated jerks of her upper extremities worse with fatigue but also with photic stimulation.  She was initially seen by Dr. Billie Ruddy who started her on Depakote.  Prior to 1/11 she has never had a generalized tonic-clonic seizure.   Per mom she has had a prior MRI  when she was initially diagnosed which was reportedly normal; I am unable to review these results  Additionally her EEG demonstrated "generalized frontally predominant spike and slow wave activity" per prior notes.  Past therapies have included:  Depakote -associated with weight gain and alopecia Lamictal up to a dose of 150/200, unclear effect on seizures given unclear compliance but no clear intolerance Keppra -associated with agitation Topamax 100 mg twice daily which helped both her migraine headaches as well as her myoclonic jerks Clonazepam 0.25 mg twice daily added in the postpartum period (July 2016)  She stopped her medications fully in 2015 for her pregnancy, delivering a 70 week old healthy baby in May 2016.  She did not have many jerking symptoms during her pregnancy but had significant recurrence of her symptoms in the early postpartum period.  She additionally had significant postpartum depression per chart review.  Seizure Risk Factors: No history of severe head trauma, developmental delay, intracranial infections, prematurity, birth complications or febrile seizures.  Surgical History: no intracranial surgeries.  Per chart review she has not been vaccinated for COVID-19  She was most recently evaluated on 1/6 due to epigastric abdominal pain with some associated vaginal bleeding  ROS: Unable to obtain due to altered mental status.  She does report a mild headache and denies abdominal pain  Past Medical History:  Diagnosis Date  . Alopecia    wears hair topper - ok to wear during 02/16/17 surgery per Dr Sherron Ales  . Anxiety    no meds  . 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  Marland Kitchen  GERD (gastroesophageal reflux disease)   . Hypertension    Resolved -Hx with 2016 pregnancy only  . Migraines    last one last week 01/2017-  . PCOS (polycystic ovarian syndrome)   . Polycystic disease, ovaries    . SVD (spontaneous vaginal delivery) 2016   x 1   Past Surgical History:  Procedure Laterality Date  . CERVICAL CERCLAGE N/A 06/26/2014   Procedure: CERCLAGE CERVICAL;  Surgeon: Lenoard Aden, MD;  Location: [facility];  Service: Gynecology;  Laterality: N/A;  . DILATATION & CURETTAGE/HYSTEROSCOPY WITH MYOSURE N/A 02/16/2017   Procedure: DILATATION & CURETTAGE/HYSTEROSCOPY WITH MYOSURE RESECTION OF ENDOMETRIAL POLYP;  Surgeon: Olivia Mackie, MD;  Location: [facility];  Service: Gynecology;  Laterality: N/A;  . 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 TONSILLECTOMY    . tubes in ears     as child  . WISDOM TOOTH EXTRACTION       Family History  Problem Relation Age of Onset  . Diabetes Mother   . Hypertension Mother   . Hypertension Father   . Alcohol abuse Maternal Grandmother   . Hearing loss Paternal Grandfather    Social History:  reports that she has never smoked. She has never used smokeless tobacco. She reports that she does not drink alcohol and does not use drugs.   Exam: Current vital signs: BP 106/61   Pulse 63   Temp 98.5 F (36.9 C) (Oral)   Resp 19   LMP 03/22/2020   SpO2 97%  Vital signs in last 24 hours: Temp:  [98.5 F (36.9 C)] 98.5 F (36.9 C) (01/11 1144) Pulse Rate:  [62-77] 63 (01/11 1930) Resp:  [10-22] 19 (01/11 1930) BP: (92-127)/(56-81) 106/61 (01/11 1930) SpO2:  [94 %-100 %] 97 % (01/11 1930)   Physical Exam  Constitutional: Appears well-developed and well-nourished.  Psych: Minimally interactive Eyes: No scleral injection HENT: No OP obstruction.  Bruising of the left orbit and left forehead. MSK: no joint deformities.  Cardiovascular: Normal rate and regular rhythm.  Respiratory: Effort normal, non-labored breathing, mild snoring GI: Soft.  No distension. There is no tenderness.  Skin: WDI, bruising as noted above  Neuro: Mental Status: Patient is sleepy but briefly awakens to repeated stimulation.  She is oriented to person, month (initially  reports February but able to self-correct), year, and situation.  Able to follow some simple commands before falling asleep Cranial Nerves: II: Visual Fields are full. Pupils are equal, round, and reactive to light.  4 to 2 mm III,IV, VI: EOMI grossly intact though she does not keep her eyes open long enough to fully assess V: Facial sensation is symmetric to eyelash brush VII: Facial movement is symmetric.  VIII: hearing is intact to voice XII: tongue is midline without atrophy or fasciculations.  Motor: Tone is normal. Bulk is normal.  She can maintain her bilateral upper extremities antigravity without significant drift.  She is at least antigravity in bilateral lower extremities Sensory: Equally reactive to stimuli in all 4 extremities Deep Tendon Reflexes: 2+ and symmetric in the biceps and patellae.  Plantars: Toes are downgoing bilaterally.    I have reviewed labs in epic and the results pertinent to this consultation are:  1/6 mildly elevated LFTs, repeat pending CK pending Normal CBC  Normal BMP other than CO2 of 19 U tox negative Blood alcohol level undetectable  I have reviewed the images obtained: HCT w/o acute intracranial process  C-spine CT w/ degenerative changes and no acute process  Impression: This  is a 35 year old woman with past medical history significant for juvenile myoclonic epilepsy, not taking any antiseizure medications, presenting with first known generalized tonic-clonic seizure in the setting of first trimester pregnancy.  Although Keppra can sometimes be associated with worsening of myoclonic jerks her movements have dramatically improved with administration of Keppra.  Given Keppra levels typically fall during pregnancy, will continue at 750 mg twice daily for now.  She will need an extensive conversation about what antiseizure medication she is willing to take as this will be critical to preventing potential serious maternal and fetal complications  should she have further seizures during pregnancy.  At this time she is too sleepy for this conversation, we will plan to discuss further tomorrow.  She will also need monitoring of her CK and infectious workup  Recommendations: - CK q12hr  - Keppra s/p loading dose 2500 mg, continue 750 mg BID given  - Appreciate primary team's evaluation for potential infection including COVID19 - Lamotrigine 25 mg BID with plans to increase gradually; may need accelerated dosing plan given increased clearance/metabolism during pregnancy  - No need for MRI at this time though would consider if patient not returning to baseline tomorrow; avoid contrast in the setting of pregnancy  Brooke Dare MD-PhD [facility] [phone]

## 2020-05-19 NOTE — Consult Note (Incomplete)
Neurology Consultation Reason for Consult: *** Requesting Physician: ***  CC: ***  History is obtained from:***  HPI: Brooke [name] is a 35 y.o. female ***   LKW: *** tPA given?: No, or if yes, time given *** IA performed?: No, or if yes, groin puncture time: *** Premorbid modified rankin scale: ***     0 - No symptoms.     1 - No significant disability. Able to carry out all usual activities, despite some symptoms.     2 - Slight disability. Able to look after own affairs without assistance, but unable to carry out all previous activities.     3 - Moderate disability. Requires some help, but able to walk unassisted.     4 - Moderately severe disability. Unable to attend to own bodily needs without assistance, and unable to walk unassisted.     5 - Severe disability. Requires constant nursing care and attention, bedridden, incontinent.     6 - Dead. ICH Score: ***  Time performed: *** GCS: {Blank single:19197::"3-4 is 2 points","5-12 is 1 point","13-15 is 0 points"} Infratentorial: {yes no:314532}. If yes, 1 point Volume: {Blank single:19197::">30cc is 1 point","<30cc is 0 points"}  Age: 35 y.o.. >80 is 1 point Intraventricular extension is 1 point  Score:***  A Score of {Blank single:19197::"0 points has a 30 day mortality of 0%","1 points has a 30 day mortality of 13%","2 points has a 30 day mortality of 26%","3 points has a 30 day mortality of 72%","4 points has a 30 day mortality of 97%","5-6 points has a 30 day mortality of 100%"}. Stroke. 2001 Apr;32(4):891-7.    ROS: A 14 point ROS was performed and is negative except as noted in the HPI. *** Unable to obtain due to altered mental status.   Past Medical History:  Diagnosis Date  . Alopecia    wears hair topper - ok to wear during 02/16/17 surgery per Dr Sherron Ales  . Anxiety    no meds  . 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   Marland Kitchen GERD (gastroesophageal reflux disease)   . Hypertension    Resolved -Hx with 2016 pregnancy only  . Migraines    last one last week 01/2017-  . PCOS (polycystic ovarian syndrome)   . Polycystic disease, ovaries   . SVD (spontaneous vaginal delivery) 2016   x 1   ***  Family History  Problem Relation Age of Onset  . Diabetes Mother   . Hypertension Mother   . Hypertension Father   . Alcohol abuse Maternal Grandmother   . Hearing loss Paternal Grandfather    ***  Social History:  reports that she has never smoked. She has never used smokeless tobacco. She reports that she does not drink alcohol and does not use drugs. ***  Exam: Current vital signs: BP 106/61   Pulse 63   Temp 98.5 F (36.9 C) (Oral)   Resp 19   LMP 03/22/2020   SpO2 97%  Vital signs in last 24 hours: Temp:  [98.5 F (36.9 C)] 98.5 F (36.9 C) (01/11 1144) Pulse Rate:  [62-77] 63 (01/11 1930) Resp:  [10-22] 19 (01/11 1930) BP: (92-127)/(56-81) 106/61 (01/11 1930) SpO2:  [94 %-100 %] 97 % (01/11 1930)   Physical Exam  Constitutional: Appears well-developed and well-nourished.  Psych: Affect appropriate to situation Eyes: No scleral injection HENT: No OP obstrucion MSK: no joint deformities.  Cardiovascular: Normal  rate and regular rhythm.  Respiratory: Effort normal, non-labored breathing GI: Soft.  No distension. There is no tenderness.  Skin: WDI  Neuro: Mental Status: Patient is awake, alert, oriented to person, place, month, year, and situation.*** Patient is able to give a clear and coherent history.*** No signs of aphasia or neglect*** Cranial Nerves: II: Visual Fields are full. Pupils are equal, round, and reactive to light.  *** III,IV, VI: EOMI without ptosis or diploplia.  V: Facial sensation is symmetric to temperature VII: Facial movement is symmetric.  VIII: hearing is intact to voice X: Uvula elevates symmetrically XI: Shoulder shrug is symmetric. XII: tongue is  midline without atrophy or fasciculations.  Motor: Tone is normal. Bulk is normal. 5/5 strength was present in all four extremities. *** Sensory: Sensation is symmetric to light touch and temperature in the arms and legs.*** Deep Tendon Reflexes: 2+ and symmetric in the biceps and patellae. *** Plantars: Toes are downgoing bilaterally. *** Cerebellar: FNF and HKS are intact bilaterally***  NIHSS total *** Score breakdown: ***    I have reviewed labs in epic and the results pertinent to this consultation are: ***  I have reviewed the images obtained:***   Impression: ***  Recommendations: - ***   Brooke Dare MD-PhD [facility] [phone]

## 2020-05-19 NOTE — ED Provider Notes (Signed)
Medical screening examination/treatment/procedure(s) were conducted as a shared visit with non-physician practitioner(s) and myself.  I personally evaluated the patient during the encounter.    35 year old female here after reportedly having a seizure prior to arrival.  Did have generalized tonic-clonic activity here lasting less than a minute.  Given Ativan.  Will consult neurology   Lorre Nick, MD 05/19/20 1437

## 2020-05-20 DIAGNOSIS — R569 Unspecified convulsions: Secondary | ICD-10-CM | POA: Diagnosis not present

## 2020-05-20 DIAGNOSIS — E860 Dehydration: Secondary | ICD-10-CM | POA: Diagnosis not present

## 2020-05-20 DIAGNOSIS — Z3490 Encounter for supervision of normal pregnancy, unspecified, unspecified trimester: Secondary | ICD-10-CM | POA: Diagnosis not present

## 2020-05-20 DIAGNOSIS — D696 Thrombocytopenia, unspecified: Secondary | ICD-10-CM | POA: Diagnosis not present

## 2020-05-20 DIAGNOSIS — Z9884 Bariatric surgery status: Secondary | ICD-10-CM

## 2020-05-20 DIAGNOSIS — R7989 Other specified abnormal findings of blood chemistry: Secondary | ICD-10-CM | POA: Diagnosis not present

## 2020-05-20 LAB — COMPREHENSIVE METABOLIC PANEL
ALT: 39 U/L (ref 0–44)
AST: 20 U/L (ref 15–41)
Albumin: 3.1 g/dL — ABNORMAL LOW (ref 3.5–5.0)
Alkaline Phosphatase: 51 U/L (ref 38–126)
Anion gap: 11 (ref 5–15)
BUN: 5 mg/dL — ABNORMAL LOW (ref 6–20)
CO2: 21 mmol/L — ABNORMAL LOW (ref 22–32)
Calcium: 8.3 mg/dL — ABNORMAL LOW (ref 8.9–10.3)
Chloride: 110 mmol/L (ref 98–111)
Creatinine, Ser: 0.63 mg/dL (ref 0.44–1.00)
GFR, Estimated: >60 mL/min (ref >60)
Glucose, Bld: 80 mg/dL (ref 70–99)
Potassium: 3.8 mmol/L (ref 3.5–5.1)
Sodium: 142 mmol/L (ref 135–145)
Total Bilirubin: 0.8 mg/dL (ref 0.3–1.2)
Total Protein: 5.4 g/dL — ABNORMAL LOW (ref 6.5–8.1)

## 2020-05-20 LAB — CBC WITH DIFFERENTIAL/PLATELET
Abs Immature Granulocytes: 0 10*3/uL (ref 0.00–0.07)
Basophils Absolute: 0 10*3/uL (ref 0.0–0.1)
Basophils Relative: 0 %
Eosinophils Absolute: 0.1 10*3/uL (ref 0.0–0.5)
Eosinophils Relative: 2 %
HCT: 38.7 % (ref 36.0–46.0)
Hemoglobin: 13 g/dL (ref 12.0–15.0)
Lymphocytes Relative: 49 %
Lymphs Abs: 1.9 10*3/uL (ref 0.7–4.0)
MCH: 31.5 pg (ref 26.0–34.0)
MCHC: 33.6 g/dL (ref 30.0–36.0)
MCV: 93.7 fL (ref 80.0–100.0)
Monocytes Absolute: 0.3 10*3/uL (ref 0.1–1.0)
Monocytes Relative: 9 %
Neutro Abs: 1.5 10*3/uL — ABNORMAL LOW (ref 1.7–7.7)
Neutrophils Relative %: 40 %
Platelets: 129 10*3/uL — ABNORMAL LOW (ref 150–400)
RBC: 4.13 MIL/uL (ref 3.87–5.11)
RDW: 13 % (ref 11.5–15.5)
WBC: 3.8 10*3/uL — ABNORMAL LOW (ref 4.0–10.5)
nRBC: 0 % (ref 0.0–0.2)
nRBC: 1 /100 WBC — ABNORMAL HIGH

## 2020-05-20 LAB — SARS CORONAVIRUS 2 (TAT 6-24 HRS): SARS Coronavirus 2: NEGATIVE

## 2020-05-20 LAB — MAGNESIUM: Magnesium: 2.2 mg/dL (ref 1.7–2.4)

## 2020-05-20 LAB — BETA-HYDROXYBUTYRIC ACID: Beta-Hydroxybutyric Acid: 2.01 mmol/L — ABNORMAL HIGH (ref 0.05–0.27)

## 2020-05-20 LAB — HIV ANTIBODY (ROUTINE TESTING W REFLEX): HIV Screen 4th Generation wRfx: NONREACTIVE

## 2020-05-20 LAB — FERRITIN: Ferritin: 207 ng/mL (ref 11–307)

## 2020-05-20 LAB — CBG MONITORING, ED: Glucose-Capillary: 79 mg/dL (ref 70–99)

## 2020-05-20 LAB — I-STAT BETA HCG BLOOD, ED (MC, WL, AP ONLY): I-stat hCG, quantitative: >2000 m[IU]/mL — ABNORMAL HIGH (ref <5)

## 2020-05-20 LAB — C-REACTIVE PROTEIN: CRP: 0.5 mg/dL (ref <1.0)

## 2020-05-20 LAB — LIPASE, BLOOD: Lipase: 64 U/L — ABNORMAL HIGH (ref 11–51)

## 2020-05-20 LAB — HEPATITIS PANEL, ACUTE
HCV Ab: NONREACTIVE
Hep A IgM: NONREACTIVE
Hep B C IgM: NONREACTIVE
Hepatitis B Surface Ag: NONREACTIVE

## 2020-05-20 LAB — CK: Total CK: 46 U/L (ref 38–234)

## 2020-05-20 LAB — TSH: TSH: 1.366 u[IU]/mL (ref 0.350–4.500)

## 2020-05-20 LAB — PHOSPHORUS: Phosphorus: 2.7 mg/dL (ref 2.5–4.6)

## 2020-05-20 MED ORDER — LEVETIRACETAM 750 MG PO TABS
750.0000 mg | ORAL_TABLET | Freq: Two times a day (BID) | ORAL | 0 refills | Status: DC
Start: 2020-05-20 — End: 2020-06-19

## 2020-05-20 MED ORDER — MIDAZOLAM 5 MG/0.1ML NA SOLN: 5.0000 mg | Freq: Once | NASAL | 0 refills | Status: DC | PRN

## 2020-05-20 MED ORDER — LAMOTRIGINE 25 MG PO TABS: ORAL_TABLET | ORAL | 0 refills | Status: DC

## 2020-05-20 MED ORDER — DEXTROSE-NACL 5-0.45 % IV SOLN: INTRAVENOUS | Status: DC

## 2020-05-20 MED ORDER — THIAMINE HCL 100 MG/ML IJ SOLN: 100.0000 mg | Freq: Every day | INTRAMUSCULAR | Status: DC

## 2020-05-20 MED ORDER — THIAMINE HCL 100 MG/ML IJ SOLN
100.0000 mg | Freq: Every day | INTRAMUSCULAR | Status: DC
  Administered 2020-05-20: 100 mg via INTRAVENOUS
  Filled 2020-05-20: qty 2

## 2020-05-20 NOTE — Evaluation (Signed)
Physical Therapy Evaluation Patient Details Name: Brooke [name] MRN: [id number] DOB: 02-Dec-1985 Today's Date: 05/20/2020   History of Present Illness  35yo female who fell in the bathroom due to a seizure on 05/19/20. Admitted with tonic-clonic convulsions. PMH epilepsy, HTN, gastric bypass  Clinical Impression   Patient received in bed, heavily asleep but easily woken; spouse present and reports she really was not able to get any sleep last night, which is why she is so lethargic this morning. Wakes easily and very cooperative and pleasant. Able to mobilize on an independent basis with no device, no significant findings with mobility or balance noted. Left in bed where she rapidly returned to sleep, all needs met. PT signing off, no skilled therapy indicated at this time- thank you for the referral!     Follow Up Recommendations No PT follow up    Equipment Recommendations  None recommended by PT    Recommendations for Other Services       Precautions / Restrictions Precautions Precautions: Other (comment) Precaution Comments: seizure/epilepsy Restrictions Weight Bearing Restrictions: No      Mobility  Bed Mobility Overal bed mobility: Independent                  Transfers Overall transfer level: Independent Equipment used: None             General transfer comment: no physical assist given, safe and steady  Ambulation/Gait         Gait velocity: WNL   General Gait Details: able to take lateral/forward/backward sidesteps in front of bed and around room without difficulty; reports she feels at her baseline  Stairs            Wheelchair Mobility    Modified Rankin (Stroke Patients Only)       Balance Overall balance assessment: Independent                                           Pertinent Vitals/Pain Pain Assessment: No/denies pain    Home Living Family/patient expects to be discharged to:: Private  residence Living Arrangements: Spouse/significant other Available Help at Discharge: Family;Available PRN/intermittently Type of Home: House Home Access: Stairs to enter Entrance Stairs-Rails: None Entrance Stairs-Number of Steps: 2-3 Home Layout: One level Home Equipment: None 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, 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    Prior Function Level of Independence: Independent               Hand Dominance        Extremity/Trunk Assessment   Upper Extremity Assessment Upper Extremity Assessment: Overall WFL for tasks assessed    Lower Extremity Assessment Lower Extremity Assessment: Overall WFL for tasks assessed    Cervical / Trunk Assessment Cervical / Trunk Assessment: Normal  Communication   Communication: No difficulties  Cognition Arousal/Alertness: Lethargic Behavior During Therapy: WFL for tasks assessed/performed Overall Cognitive Status: Within Functional Limits for tasks assessed                                 General Comments: lethargic because she really was not able to sleep well through the night, but pleasant and cooperative      General Comments General comments (skin integrity, edema, etc.): VSS on RA    Exercises     Assessment/Plan  PT Assessment Patent does not need any further PT services  PT Problem List Decreased mobility;Obesity       PT Treatment Interventions Gait training;Functional mobility training;Therapeutic activities    PT Goals (Current goals can be found in the Care Plan section)  Acute Rehab PT Goals Patient Stated Goal: go home PT Goal Formulation: With patient/family Time For Goal Achievement: 06/03/20 Potential to Achieve Goals: Good    Frequency Other (Comment) (Eval only)   Barriers to discharge        Co-evaluation               AM-PAC PT "6 Clicks" Mobility  Outcome Measure Help needed turning from your back to your side while in a flat bed  without using bedrails?: None Help needed moving from lying on your back to sitting on the side of a flat bed without using bedrails?: None Help needed moving to and from a bed to a chair (including a wheelchair)?: None Help needed standing up from a chair using your arms (e.g., wheelchair or bedside chair)?: None Help needed to walk in hospital room?: None Help needed climbing 3-5 steps with a railing? : None 6 Click Score: 24    End of Session   Activity Tolerance: Patient tolerated treatment well Patient left: in bed;with call bell/phone within reach;with family/visitor present Nurse Communication: Mobility status PT Visit Diagnosis: History of falling (Z91.81)    Time: 1686-1042 PT Time Calculation (min) (ACUTE ONLY): 13 min   Charges:   PT Evaluation $PT Eval Low Complexity: 1 Low          Windell Norfolk, DPT, PN1   Supplemental Physical Therapist [place]    Pager [phone] Acute Rehab Office [phone]

## 2020-05-20 NOTE — Discharge Summary (Signed)
Physician Discharge Summary  Brooke [name] [name] DOB: [birth year] DOA: 05/19/2020  PCP: Kandyce RudBabaoff, Marcus, MD  Admit date: 05/19/2020 Discharge date: 05/20/2020  Admitted From: Home Disposition: Home  Recommendations for Outpatient Follow-up:  1. Follow up with PCP in 1 week  2. Patient follow-up with neurology.  Patient advised not to drive for at least 6 months and only to resume driving once cleared by PCP and/or neurology 3. Outpatient follow-up with obstetrics 4. 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  She is currently [redacted] weeks pregnant.  She had another seizure in the emergency department.  Neurology was consulted.  She was started on Lamictal and Keppra.  CT of the head was negative for any acute intracranial abnormality.  CT cervical spine was negative for fracture.  COVID-19 test was negative.  During hospitalization, no further seizure activities noted.  She has tolerated PT.  She will be discharged with antiepileptics as per neurology recommendations.  Discharge Diagnoses:   Seizure in a patient with history of juvenile myoclonic epilepsy -Presented with seizure at home and another seizure in the ED. -CT of the head and CT of the cervical spine was negative for any acute intracranial abnormality or cervical spine fracture. -Currently on Lamictal and Keppra.  Neurology following.  Follow further recommendations.  No further seizures since admission. - She has tolerated PT.  She will be discharged home today on lamictal and keppra and as needed nasal midazolam as per Neurology.  Asthma -Still.  No wheezing.  Outpatient  follow-up  Vaginal bleeding affecting early pregnancy -Patient is currently almost [redacted] weeks pregnant and has had mild vaginal spotting.  Transvaginal ultrasound done on admission is stable.  Outpatient follow-up with OB  Elevated LFTs -Improved.  Questionable cause.    Thrombocytopenia -No signs of bleeding.  Outpatient follow-up  Leukopenia -Questionable cause.  Stable.  Afebrile  Dehydration -Treated with IV fluids.   Discharge Instructions   Allergies as of 05/20/2020   No Known Allergies     Medication List    TAKE these medications   lamoTRIgine 25 MG tablet Commonly known as: LaMICtal 25mg  in AM till 05/22/20 then 25mg  BID from 1/15-1/18 then 50mg  in AM, 25mg  in PM from 1/19-122 then 50mg  BID from 1/23-1/26 then 75mg  in AM and 50mg  in PM from 1/27-1/30 then 75mg  BID from 1/31-2/3 then 100mg  in AM and 75mg  in PM from 2/4-2/7 then 100mg  BID from 2/8-2/11. Follow up with Neurology regarding further dosing   levETIRAcetam 750 MG tablet Commonly known as: Keppra Take 1 tablet (750 mg total) by mouth 2 (two) times daily.   metroNIDAZOLE 0.75 % vaginal gel Commonly known as: METROGEL Place 1 Applicatorful vaginally at bedtime.   Midazolam 5 MG/0.1ML Soln Place 5 mg into the nose once as needed for up to 1 dose (for generalized tonic-clonic seizure lasting more than 2 minutes, no more than 2 doses., watch for respiratory depression).   multivitamin tablet Take 1 tablet by mouth daily.   pantoprazole 40 MG tablet Commonly known as: PROTONIX Take 40 mg by mouth daily.        Follow-up Information    Rema Jasminenasanya, Olukayode O, MD.   Specialty: Neurology Why: If symptoms worsen/if she has another seizure  Contact information: [address] [place] [address] [phone]        [facility] EMERGENCY DEPARTMENT.   Specialty: Emergency Medicine Why: If symptoms worsen Contact information: [address] [id number] m[place] [address] [phone]             No Known Allergies  Consultations:  Neurology   Procedures/Studies: CT Head Wo Contrast  Result Date: 05/19/2020 CLINICAL DATA:  Seizure leading to fall in bathroom striking head on side of tub. Pregnant patient in first-trimester pregnancy. EXAM: CT HEAD WITHOUT CONTRAST TECHNIQUE: Contiguous axial images were obtained from the base of the skull through the vertex without intravenous contrast. COMPARISON:  Head CT 11/14/2011 FINDINGS: Brain: No intracranial hemorrhage, mass effect, or midline shift. No hydrocephalus. The basilar cisterns are patent. No evidence of territorial infarct or acute ischemia. No extra-axial or intracranial fluid collection. Vascular: No hyperdense vessel. Skull: No fracture or focal lesion. Sinuses/Orbits: Paranasal sinuses and mastoid air cells are clear. The visualized orbits are unremarkable. Other: Mild motion artifact but diagnostic exam. IMPRESSION: Negative head CT. Electronically Signed   By: Narda Rutherford M.D.   On: 05/19/2020 18:31   CT Cervical Spine Wo Contrast  Result Date: 05/19/2020 CLINICAL DATA:  Seizure leading to fall in bathroom striking head on side of tub. Cervical neck pain. Pregnant patient in first-trimester pregnancy. EXAM: CT CERVICAL SPINE WITHOUT CONTRAST TECHNIQUE: Multidetector CT imaging of the cervical spine was performed without intravenous contrast. Multiplanar CT image reconstructions were also generated. COMPARISON:  None. FINDINGS: Alignment: Normal, allowing for mild head tilt on the scanner. Skull base and vertebrae: No acute fracture. Vertebral body heights are maintained. The dens and skull base are intact. Soft tissues and spinal canal: No prevertebral fluid or swelling. No visible canal hematoma. Disc levels: Posterior spurring at C3-C4, C5-C6, and C6-C7. Associated C5-C6 and C6-C7 disc space narrowing. Upper chest: Negative. Other: None. IMPRESSION: Degenerative change in  the cervical spine without acute fracture or subluxation. Electronically Signed   By: Narda Rutherford M.D.   On: 05/19/2020 18:34   US OB LESS THAN 14 WEEKS WITH OB TRANSVAGINAL  Result Date: 05/19/2020 CLINICAL DATA:  Pregnant patient in first-trimester pregnancy with vaginal bleeding. EXAM: OBSTETRIC <14 WK [place] AND TRANSVAGINAL OB US TECHNIQUE: Both transabdominal and transvaginal ultrasound examinations were performed for complete evaluation of the gestation as well as the maternal uterus, adnexal regions, and pelvic cul-de-sac. Transvaginal technique was performed to assess early pregnancy. COMPARISON:  Obstetric ultrasound 5 days ago 05/14/2020 FINDINGS: Intrauterine gestational sac: Single Yolk sac:  Visualized. Embryo:  Not Visualized. Cardiac Activity: Not Visualized. MSD: 8.3 mm   5 w   4 d Subchorionic hemorrhage:  None visualized. Maternal uterus/adnexae: Interviewed uterus. Development of gestational sac containing a yolk sac from prior exam. Fetal pole and cardiac activity not yet demonstrated. No subchorionic hemorrhage. Right ovary is visualized transabdominally and unremarkable, the previous 1.5 cm complex cyst is not well seen. Complex cyst in the left ovary is again seen measuring approximately 2 cm, decreased from 2.4 cm on prior. No extra ovarian adnexal mass. No pelvic free fluid. IMPRESSION: 1. Development of an intrauterine gestational sac and yolk sac since prior exam. Fetal pole and cardiac activity are not yet demonstrated. 2. No subchorionic hemorrhage. 3. Complex cyst in the left ovary has diminished in size from prior, may represent a resolving hemorrhagic cyst. Electronically Signed   By: Narda Rutherford M.D.   On: 05/19/2020  16:46   US OB LESS THAN 14 WEEKS WITH OB TRANSVAGINAL  Result Date: 05/14/2020 CLINICAL DATA:  Vaginal bleeding. EXAM: OBSTETRIC <14 WK [place] AND TRANSVAGINAL OB US TECHNIQUE: Both transabdominal and transvaginal ultrasound examinations were performed for  complete evaluation of the gestation as well as the maternal uterus, adnexal regions, and pelvic cul-de-sac. Transvaginal technique was performed to assess early pregnancy. COMPARISON:  Ultrasound 09/15/2014. FINDINGS: Intrauterine gestational sac: None visualized Yolk sac:  None visualized Embryo:  None visualized Cardiac Activity: None visualized Subchorionic hemorrhage:  None visualized Maternal uterus/adnexae: Prominent thickening of the endometrium at 27 mm. 1.5 cm complex cyst right ovary. 2.4 cm complex cyst with internal echoes/septation noted the left ovary. Correlation with pregnancy test suggested to exclude ectopic pregnancy. Follow-up pelvic ultrasound can be obtained to demonstrate resolution of these findings. Bilateral ovarian blood flow noted. Trace free pelvic fluid noted. IMPRESSION: 1. No intrauterine gestational sac noted. Prominent thickening of the endometrium at 27 mm. 2.  Bilateral small complex ovarian cysts. 3.  Trace free pelvic fluid. Pregnancy test suggested to further evaluate and to exclude ectopic pregnancy. Continued follow-up pelvic ultrasounds to demonstrate resolution of the above findings suggested. Electronically Signed   By: Maisie Fus  Register   On: 05/14/2020 13:30   US Abdomen Limited RUQ (LIVER/GB)  Result Date: 05/19/2020 CLINICAL DATA:  Right upper quadrant abdominal pain EXAM: ULTRASOUND ABDOMEN LIMITED RIGHT UPPER QUADRANT COMPARISON:  Ultrasound abdomen complete 09/20/2012 FINDINGS: Gallbladder: No gallstones or wall thickening visualized. No sonographic Murphy sign noted by sonographer. Common bile duct: Diameter: 4 Liver: No focal lesion identified. Within normal limits in parenchymal echogenicity. Portal vein is patent on color Doppler imaging with normal direction of blood flow towards the liver. Other: None. IMPRESSION: Unremarkable right upper quadrant ultrasound Electronically Signed   By: Acquanetta Belling M.D.   On: 05/19/2020 09:45        Subjective: Patient seen and examined at bedside.  Feels tired.  No seizures since admission.  No overnight fever, vomiting or worsening shortness of breath reported.  Discharge Exam: Vitals:   05/20/20 0900 05/20/20 1000  BP: 107/60 (!) 94/57  Pulse: 62 76  Resp: 15 14  Temp:    SpO2: 95% 94%    General: Pt is sleepy, wakes up and answers questions appropriately.  Husband at bedside states that patient is back to baseline Cardiovascular: rate controlled, S1/S2 + Respiratory: bilateral decreased breath sounds at bases Abdominal: Soft, obese, NT, ND, bowel sounds + Extremities: no edema, no cyanosis    The results of significant diagnostics from this hospitalization (including imaging, microbiology, ancillary and laboratory) are listed below for reference.     Microbiology: Recent Results (from the past 240 hour(s))  Resp panel by RT-PCR (RSV, Flu A&B, Covid) Nasopharyngeal Swab     Status: None   Collection Time: 05/14/20  3:15 PM   Specimen: Nasopharyngeal Swab; Nasopharyngeal(NP) swabs in vial transport medium  Result Value Ref Range Status   SARS Coronavirus 2 by RT PCR NEGATIVE NEGATIVE Final    Comment: (NOTE) SARS-CoV-2 target nucleic acids are NOT DETECTED.  The SARS-CoV-2 RNA is generally detectable in upper respiratory specimens during the acute phase of infection. The lowest concentration of SARS-CoV-2 viral copies this assay can detect is 138 copies/mL. A negative result does not preclude SARS-Cov-2 infection and should not be used as the sole basis for treatment or other patient management decisions. A negative result may occur with  improper specimen collection/handling, submission of specimen other than nasopharyngeal  swab, presence of viral mutation(s) within the areas targeted by this assay, and inadequate number of viral copies(<138 copies/mL). A negative result must be combined with clinical observations, patient history, and  epidemiological information. The expected result is Negative.  Fact Sheet for Patients:  BloggerCourse.com  Fact Sheet for Healthcare Providers:  SeriousBroker.it  This test is no t yet approved or cleared by the [place] FDA and  has been authorized for detection and/or diagnosis of SARS-CoV-2 by FDA under an Emergency Use Authorization (EUA). This EUA will remain  in effect (meaning this test can be used) for the duration of the COVID-19 declaration under Section 564(b)(1) of the Act, 21 U.S.C.section 360bbb-3(b)(1), unless the authorization is terminated  or revoked sooner.       Influenza A by PCR NEGATIVE NEGATIVE Final   Influenza B by PCR NEGATIVE NEGATIVE Final    Comment: (NOTE) The Xpert Xpress SARS-CoV-2/FLU/RSV plus assay is intended as an aid in the diagnosis of influenza from Nasopharyngeal swab specimens and should not be used as a sole basis for treatment. Nasal washings and aspirates are unacceptable for Xpert Xpress SARS-CoV-2/FLU/RSV testing.  Fact Sheet for Patients: BloggerCourse.com  Fact Sheet for Healthcare Providers: SeriousBroker.it  This test is not yet approved or cleared by the [place] FDA and has been authorized for detection and/or diagnosis of SARS-CoV-2 by FDA under an Emergency Use Authorization (EUA). This EUA will remain in effect (meaning this test can be used) for the duration of the COVID-19 declaration under Section 564(b)(1) of the Act, 21 U.S.C. section 360bbb-3(b)(1), unless the authorization is terminated or revoked.     Resp Syncytial Virus by PCR NEGATIVE NEGATIVE Final    Comment: (NOTE) Fact Sheet for Patients: BloggerCourse.com  Fact Sheet for Healthcare Providers: SeriousBroker.it  This test is not yet approved or cleared by the [place] FDA and has been  authorized for detection and/or diagnosis of SARS-CoV-2 by FDA under an Emergency Use Authorization (EUA). This EUA will remain in effect (meaning this test can be used) for the duration of the COVID-19 declaration under Section 564(b)(1) of the Act, 21 U.S.C. section 360bbb-3(b)(1), unless the authorization is terminated or revoked.  Performed at [facility], [address] [place], [place] [address]   SARS CORONAVIRUS 2 (TAT 6-24 HRS) Nasopharyngeal Nasopharyngeal Swab     Status: None   Collection Time: 05/19/20  6:01 PM   Specimen: Nasopharyngeal Swab  Result Value Ref Range Status   SARS Coronavirus 2 NEGATIVE NEGATIVE Final    Comment: (NOTE) SARS-CoV-2 target nucleic acids are NOT DETECTED.  The SARS-CoV-2 RNA is generally detectable in upper and lower respiratory specimens during the acute phase of infection. Negative results do not preclude SARS-CoV-2 infection, do not rule out co-infections with other pathogens, and should not be used as the sole basis for treatment or other patient management decisions. Negative results must be combined with clinical observations, patient history, and epidemiological information. The expected result is Negative.  Fact Sheet for Patients: HairSlick.no  Fact Sheet for Healthcare Providers: quierodirigir.com  This test is not yet approved or cleared by the [place] FDA and  has been authorized for detection and/or diagnosis of SARS-CoV-2 by FDA under an Emergency Use Authorization (EUA). This EUA will remain  in effect (meaning this test can be used) for the duration of the COVID-19 declaration under Se ction 564(b)(1) of the Act, 21 U.S.C. section 360bbb-3(b)(1), unless the authorization is terminated or revoked sooner.  Performed at  [facility], [address] [place], [place] [address]      Labs: BNP (last 3 results) No results for input(s): BNP in the  last 8760 hours. Basic Metabolic Panel: Recent Labs  Lab 05/14/20 1139 05/19/20 1225 05/19/20 2000 05/19/20 2015 05/20/20 0435  NA 136 136  --  143 142  K 4.7 4.0  --  3.7 3.8  CL 102 103  --   --  110  CO2 24 19*  --   --  21*  GLUCOSE 91 98  --   --  80  BUN 10 8  --   --  5*  CREATININE 0.73 0.68  --   --  0.63  CALCIUM 8.5* 8.6*  --   --  8.3*  MG  --   --  1.7  --  2.2  PHOS  --   --   --   --  2.7   Liver Function Tests: Recent Labs  Lab 05/14/20 1139 05/19/20 2000 05/20/20 0435  AST 42* 23 20  ALT 71* 36 39  ALKPHOS 61 50 51  BILITOT 0.8 0.3 0.8  PROT 6.4* 5.1* 5.4*  ALBUMIN 3.6 2.9* 3.1*   Recent Labs  Lab 05/19/20 2000 05/20/20 0435  LIPASE 59* 64*   No results for input(s): AMMONIA in the last 168 hours. CBC: Recent Labs  Lab 05/14/20 1139 05/19/20 1225 05/19/20 2015 05/20/20 0435  WBC 3.1*  2.9* 7.1  --  3.8*  NEUTROABS 1.2*  --   --  1.5*  HGB 14.5  14.5 14.2 11.6* 13.0  HCT 43.2  41.3 42.3 34.0* 38.7  MCV 91.5  90.8 91.0  --  93.7  PLT 121*  111* 127*  --  129*   Cardiac Enzymes: Recent Labs  Lab 05/19/20 2000 05/19/20 2058 05/20/20 0435  CKTOTAL 59 52 46   BNP: Invalid input(s): POCBNP CBG: Recent Labs  Lab 05/19/20 1342 05/20/20 0151  GLUCAP 91 79   D-Dimer No results for input(s): DDIMER in the last 72 hours. Hgb A1c No results for input(s): HGBA1C in the last 72 hours. Lipid Profile No results for input(s): CHOL, HDL, LDLCALC, TRIG, CHOLHDL, LDLDIRECT in the last 72 hours. Thyroid function studies Recent Labs    05/20/20 0435  TSH 1.366   Anemia work up Recent Labs    05/20/20 0435  FERRITIN 207   Urinalysis    Component Value Date/Time   COLORURINE YELLOW 05/19/2020 1736   APPEARANCEUR HAZY (A) 05/19/2020 1736   LABSPEC 1.027 05/19/2020 1736   PHURINE 5.0 05/19/2020 1736   GLUCOSEU NEGATIVE 05/19/2020 1736   HGBUR MODERATE (A) 05/19/2020 1736   BILIRUBINUR NEGATIVE 05/19/2020 1736   KETONESUR 80  (A) 05/19/2020 1736   PROTEINUR 30 (A) 05/19/2020 1736   UROBILINOGEN 0.2 08/14/2014 2130   NITRITE NEGATIVE 05/19/2020 1736   LEUKOCYTESUR NEGATIVE 05/19/2020 1736   Sepsis Labs Invalid input(s): PROCALCITONIN,  WBC,  LACTICIDVEN Microbiology Recent Results (from the past 240 hour(s))  Resp panel by RT-PCR (RSV, Flu A&B, Covid) Nasopharyngeal Swab     Status: None   Collection Time: 05/14/20  3:15 PM   Specimen: Nasopharyngeal Swab; Nasopharyngeal(NP) swabs in vial transport medium  Result Value Ref Range Status   SARS Coronavirus 2 by RT PCR NEGATIVE NEGATIVE Final    Comment: (NOTE) SARS-CoV-2 target nucleic acids are NOT DETECTED.  The SARS-CoV-2 RNA is generally detectable in upper respiratory specimens during the acute phase of infection. The lowest concentration of  SARS-CoV-2 viral copies this assay can detect is 138 copies/mL. A negative result does not preclude SARS-Cov-2 infection and should not be used as the sole basis for treatment or other patient management decisions. A negative result may occur with  improper specimen collection/handling, submission of specimen other than nasopharyngeal swab, presence of viral mutation(s) within the areas targeted by this assay, and inadequate number of viral copies(<138 copies/mL). A negative result must be combined with clinical observations, patient history, and epidemiological information. The expected result is Negative.  Fact Sheet for Patients:  BloggerCourse.comhttps://www.fda.gov/media/152166/download  Fact Sheet for Healthcare Providers:  SeriousBroker.ithttps://www.fda.gov/media/152162/download  This test is no t yet approved or cleared by the [place] FDA and  has been authorized for detection and/or diagnosis of SARS-CoV-2 by FDA under an Emergency Use Authorization (EUA). This EUA will remain  in effect (meaning this test can be used) for the duration of the COVID-19 declaration under Section 564(b)(1) of the Act, 21 U.S.C.section  360bbb-3(b)(1), unless the authorization is terminated  or revoked sooner.       Influenza A by PCR NEGATIVE NEGATIVE Final   Influenza B by PCR NEGATIVE NEGATIVE Final    Comment: (NOTE) The Xpert Xpress SARS-CoV-2/FLU/RSV plus assay is intended as an aid in the diagnosis of influenza from Nasopharyngeal swab specimens and should not be used as a sole basis for treatment. Nasal washings and aspirates are unacceptable for Xpert Xpress SARS-CoV-2/FLU/RSV testing.  Fact Sheet for Patients: BloggerCourse.comhttps://www.fda.gov/media/152166/download  Fact Sheet for Healthcare Providers: SeriousBroker.ithttps://www.fda.gov/media/152162/download  This test is not yet approved or cleared by the [place] FDA and has been authorized for detection and/or diagnosis of SARS-CoV-2 by FDA under an Emergency Use Authorization (EUA). This EUA will remain in effect (meaning this test can be used) for the duration of the COVID-19 declaration under Section 564(b)(1) of the Act, 21 U.S.C. section 360bbb-3(b)(1), unless the authorization is terminated or revoked.     Resp Syncytial Virus by PCR NEGATIVE NEGATIVE Final    Comment: (NOTE) Fact Sheet for Patients: BloggerCourse.comhttps://www.fda.gov/media/152166/download  Fact Sheet for Healthcare Providers: SeriousBroker.ithttps://www.fda.gov/media/152162/download  This test is not yet approved or cleared by the [place] FDA and has been authorized for detection and/or diagnosis of SARS-CoV-2 by FDA under an Emergency Use Authorization (EUA). This EUA will remain in effect (meaning this test can be used) for the duration of the COVID-19 declaration under Section 564(b)(1) of the Act, 21 U.S.C. section 360bbb-3(b)(1), unless the authorization is terminated or revoked.  Performed at [facility], [address] [place], [place] [address]   SARS CORONAVIRUS 2 (TAT 6-24 HRS) Nasopharyngeal Nasopharyngeal Swab     Status: None   Collection Time: 05/19/20  6:01 PM   Specimen: Nasopharyngeal  Swab  Result Value Ref Range Status   SARS Coronavirus 2 NEGATIVE NEGATIVE Final    Comment: (NOTE) SARS-CoV-2 target nucleic acids are NOT DETECTED.  The SARS-CoV-2 RNA is generally detectable in upper and lower respiratory specimens during the acute phase of infection. Negative results do not preclude SARS-CoV-2 infection, do not rule out co-infections with other pathogens, and should not be used as the sole basis for treatment or other patient management decisions. Negative results must be combined with clinical observations, patient history, and epidemiological information. The expected result is Negative.  Fact Sheet for Patients: HairSlick.nohttps://www.fda.gov/media/138098/download  Fact Sheet for Healthcare Providers: quierodirigir.comhttps://www.fda.gov/media/138095/download  This test is not yet approved or cleared by the [place] FDA and  has been authorized for detection and/or diagnosis of SARS-CoV-2  by FDA under an Emergency Use Authorization (EUA). This EUA will remain  in effect (meaning this test can be used) for the duration of the COVID-19 declaration under Se ction 564(b)(1) of the Act, 21 U.S.C. section 360bbb-3(b)(1), unless the authorization is terminated or revoked sooner.  Performed at [facility], [address] [place], [place] [address]      Time coordinating discharge: 35 minutes  SIGNED:   Glade Lloyd, MD  [facility] 05/20/2020, 11:26 AM

## 2020-05-20 NOTE — Progress Notes (Signed)
PT eval complete, no skilled needs identified. OK for return home when medically ready.   Madelaine Etienne, DPT, PN1   Supplemental Physical Therapist [facility]    Pager [phone] Acute Rehab Office [phone]

## 2020-05-20 NOTE — Progress Notes (Addendum)
Subjective: No further seizures overnight.  States she is feeling well now.  States she was not on any antiseizure medications because she always got a warning sign before the seizure and would sit down.  Denies ever having generalized tonic-clonic seizures in the past.  ROS: negative except above  Examination  Vital signs in last 24 hours: Temp:  [98.6 F (37 C)] 98.6 F (37 C) (01/12 0630) Pulse Rate:  [57-77] 76 (01/12 1000) Resp:  [10-22] 14 (01/12 1000) BP: (92-126)/(44-81) 94/57 (01/12 1000) SpO2:  [94 %-100 %] 94 % (01/12 1000)  General: lying in bed, NAD, left black eye CVS: pulse-normal rate and rhythm RS: breathing comfortably, CTAB Extremities: normal, warm Neuro: AOx3, cranial nerves II to XII grossly intact, 5/5 in all extremities.  Basic Metabolic Panel: Recent Labs  Lab 05/14/20 1139 05/19/20 1225 05/19/20 2000 05/19/20 2015 05/20/20 0435  NA 136 136  --  143 142  K 4.7 4.0  --  3.7 3.8  CL 102 103  --   --  110  CO2 24 19*  --   --  21*  GLUCOSE 91 98  --   --  80  BUN 10 8  --   --  5*  CREATININE 0.73 0.68  --   --  0.63  CALCIUM 8.5* 8.6*  --   --  8.3*  MG  --   --  1.7  --  2.2  PHOS  --   --   --   --  2.7    CBC: Recent Labs  Lab 05/14/20 1139 05/19/20 1225 05/19/20 2015 05/20/20 0435  WBC 3.1*  2.9* 7.1  --  3.8*  NEUTROABS 1.2*  --   --  1.5*  HGB 14.5  14.5 14.2 11.6* 13.0  HCT 43.2  41.3 42.3 34.0* 38.7  MCV 91.5  90.8 91.0  --  93.7  PLT 121*  111* 127*  --  129*    Coagulation Studies: Recent Labs    05/19/20 [id number]  LABPROT 13.3  INR 1.1    Imaging CTH wo contrast 05/19/2020: no acute abnormality  ASSESSMENT AND PLAN: 35 year old woman with past medical history significant for juvenile myoclonic epilepsy, not taking any antiseizure medications, presenting with first known generalized tonic-clonic seizure in the setting of first trimester pregnancy.   JME with breakthrough seizure - Unclear etiology of breakthrough  seizure.  No known leukocytosis, normal electrolytes, no evidence of UTI, pneumonia.  Could be due to lowered seizure threshold due to pregnancy and not being on any antiseizure medications  Recommendations -Discussed that given patient's history of JME and generalized tonic-clonic seizure, patient will benefit from being on antiepileptic drugs.  Also discussed that all antiepileptic drugs have some teratogenic potential but having another generalized tonic-clonic seizure will also likely place the fetus at risk.  Of note, patient is currently most likely [redacted] weeks pregnant. -After weighing the risks and benefits of starting antiplatelet drugs, patient agrees that she would like to start an antiepileptic drug. -We will start Lamotrigine at 25mg  daily with quick escalation as noted below -Discussed possible side effects including Stevens-Johnson syndrome with patient and husband at bedside.  Recommended to call neurologist stat and stop lamotrigine or go to nearest ED if she sees any rash, ulcers in her mouth -While we are titrating lamotrigine, recommend continuing Keppra 750 mg twice daily for now which can be weaned off once patient is therapeutic on lamotrigine. Of note, patient has had significant agitation In the  past on Keppra and therefore would like to avoid it long-term. -Recommend follow-up with Dr. Karel Jarvis at [facility] in 5 to 7 days.  Patient will likely need close follow-up with an epileptologist because lamotrigine levels are noted to drop during pregnancy and therefore require monitoring of drug levels and careful dose titration. -Also recommend prescribing intranasal Versed 5mg  for generalized tonic-clonic seizure lasting more than 2 minutes, no more than 2 doses., watch for respiratory depression. -Discussed seizure precautions including no driving for 6 months per [facility] law -Management of rest of comorbidities per primary team  Lamotrigine Morning Evening   05/19/2020-05/22/2020 25mg    05/23/2020-1/118/2022 25mg  25mg   05/27/2020-05/30/2020 50mg  25mg   05/31/2020- 06/03/2020 50mg  50mg   06/04/2020-06/07/2020 75mg  50mg   06/08/2020-06/11/2020 75mg  75mg   06/12/2020-06/15/2020 100mg  75mg   06/16/2020-06/19/2020 100mg  100mg    I have spent a total of  45  minutes with the patient reviewing hospital notes,  test results, labs and examining the patient as well as establishing an assessment and plan that was discussed personally with the patient and husband at bedside.  > 50% of time was spent in direct patient care.   06/06/2020 Epilepsy [facility] For questions after 5pm please refer to AMION to reach the Neurologist on call

## 2020-05-21 LAB — HAPTOGLOBIN: Haptoglobin: 116 mg/dL (ref 33–278)

## 2020-05-25 ENCOUNTER — Ambulatory Visit: Payer: 59

## 2020-05-25 ENCOUNTER — Ambulatory Visit: Payer: 59 | Admitting: Neurology

## 2020-05-27 LAB — VITAMIN B1: Vitamin B1 (Thiamine): 145.5 nmol/L (ref 66.5–200.0)

## 2020-06-09 ENCOUNTER — Ambulatory Visit: Payer: 59 | Admitting: Neurology

## 2020-06-09 ENCOUNTER — Encounter: Payer: Self-pay | Admitting: Neurology

## 2020-06-09 VITALS — BP 124/78 | HR 69 | Ht 64.0 in | Wt 182.0 lb

## 2020-06-09 DIAGNOSIS — Z3490 Encounter for supervision of normal pregnancy, unspecified, unspecified trimester: Secondary | ICD-10-CM

## 2020-06-09 DIAGNOSIS — G40B09 Juvenile myoclonic epilepsy, not intractable, without status epilepticus: Secondary | ICD-10-CM | POA: Diagnosis not present

## 2020-06-09 MED ORDER — LAMOTRIGINE 100 MG PO TABS
100.0000 mg | ORAL_TABLET | Freq: Two times a day (BID) | ORAL | 11 refills | Status: DC
Start: 2020-06-09 — End: 2020-09-23

## 2020-06-09 NOTE — Progress Notes (Signed)
[facility]  PATIENT: Brooke [name] DOB: 09/30/1985  REFERRING DOCTOR OR PCP:  Kandyce Rud SOURCE: Patient, notes from recent admission including consult notes, imaging and laboratory reports.   _________________________________   HISTORICAL  CHIEF COMPLAINT:  Chief Complaint  Patient presents with  . New Patient (Initial Visit)    RM 26 w/ husband, Fayrene Fearing. ED referral for seizures. Dx at age 35 by Dr. Sharene Skeans. Then she followed Dr. Modesto Charon for years. Dr. Modesto Charon moved out of state. Recently saw Dr. Loralie Champagne at [facility], wants another 2nd opinion.  Went to ED on 05/19/20. Had seizure (witnessed by husband) that day. Lasted 1-2 min. She was confused for a whole week after event. No seizures since. Currently pregnant, unsure how far long. Has OB appt tomorrow to find out. Had gastridc bypass 02/03/20.     HISTORY OF PRESENT ILLNESS:  I had the pleasure of seeing your patient, Brooke [name], at [facility] for neurologic consultation regarding her epilepsy.  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   At that time, she had episodes of myoclonus, especially in the mornings.  She noted that going back to sleep would help.   She saw Dr. Sharene Skeans and was placed on Depakote but gained weight and lost hair.  On a combination of lamotrigine and Depakote, she did better with decent tolerability and no further hair loss.   At some point, she was switched to Topamax which helped the best (no myoclonus) but caused tingling and altered taste.    Myoclonus always did better if she slept better.  She had a flurry of myoclonic jerks associated with hr son's birth 6 years ago  She never had a definite absence seizure.   However, Two years ago, she had a possible absence event.   She had a conversation at work but does not recall several minutes.      Her first genralied  tonic clonic seizure was 05/19/2020.   She was unable to sleep due to feeling very cold.  She decided to take a warm bath.  While getting the tub ready, she started convulsing.   The husband who was in different part of the house heard the noise and ran in and saw her convulsing for about 1 minute.    He got her on the bathroom floor.   He called 911 after she stopped seizing.   THis was about 1030 am. She went to the ED.   she was conversant after the seizure but she does not recall that period of time.  About noon, in the ED, she had a second GTC seizure.   She was started on Keppra and lamotrigine.   She does not recall the rest of that day and has spotty memory of the next few days.  She has migraine headaches.   They have generally done well since her son is back in school.    She is now sleeping 9-10 hours because she gets sleepy at 7 pm and goes to bed at wakes up to get son   On 02/03/2019 she had a [place]Y gastric bypass and she has lost 75 pounds.   She feels lightheaded at times.   This seems to have been worse since her gastric bypass surgery.  She is pregnant (early and will see OB/Gyn tomorrow)  CT scan 05/19/2020 was normal.   Actual images were reviewed and I concur.  Labs  showed low nornmal Mg and mildly low Calcium and albumin.    EEG 10/29/2014 showed 1-3 second bursts of generalized irregular 3-4 Hz spike and polyspike and wave discharges with frontal predominance.     REVIEW OF SYSTEMS: Constitutional: No fevers, chills, sweats, or change in appetite Eyes: No visual changes, double vision, eye pain Ear, nose and throat: No hearing loss, ear pain, nasal congestion, sore throat Cardiovascular: No chest pain, palpitations Respiratory: No shortness of breath at rest or with exertion.   No wheezes GastrointestinaI: No nausea, vomiting, diarrhea, abdominal pain, fecal incontinence Genitourinary: No dysuria, urinary retention or frequency.  No nocturia. Musculoskeletal: No neck  pain, back pain Integumentary: No rash, pruritus, skin lesions Neurological: as above Psychiatric: No depression at this time.  No anxiety Endocrine: No palpitations, diaphoresis, change in appetite, change in weigh or increased thirst Hematologic/Lymphatic: No anemia, purpura, petechiae. Allergic/Immunologic: No itchy/runny eyes, nasal congestion, recent allergic reactions, rashes  ALLERGIES: No Known Allergies  HOME MEDICATIONS:  Current Outpatient Medications:  .  lamoTRIgine (LAMICTAL) 100 MG tablet, Take 1 tablet (100 mg total) by mouth 2 (two) times daily., Disp: 60 tablet, Rfl: 11 .  levETIRAcetam (KEPPRA) 750 MG tablet, Take 1 tablet (750 mg total) by mouth 2 (two) times daily., Disp: 60 tablet, Rfl: 0 .  Multiple Vitamin (MULTIVITAMIN) tablet, Take 1 tablet by mouth daily., Disp: , Rfl:  .  pantoprazole (PROTONIX) 40 MG tablet, Take 40 mg by mouth daily., Disp: , Rfl:  .  Midazolam 5 MG/0.1ML SOLN, Place 5 mg into the nose once as needed for up to 1 dose (for generalized tonic-clonic seizure lasting more than 2 minutes, no more than 2 doses., watch for respiratory depression). (Patient not taking: Reported on 06/09/2020), Disp: 2 each, Rfl: 0  PAST MEDICAL HISTORY: Past Medical History:  Diagnosis Date  . Alopecia    wears hair topper - ok to wear during 02/16/17 surgery per Dr Sherron Ales  . Anxiety    no meds  . 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  Marland Kitchen GERD (gastroesophageal reflux disease)   . Hypertension    Resolved -Hx with 2016 pregnancy only  . Migraines    last one last week 01/2017-  . PCOS (polycystic ovarian syndrome)   . Polycystic disease, ovaries   . SVD (spontaneous vaginal delivery) 2016   x 1    PAST SURGICAL HISTORY: Past Surgical History:  Procedure Laterality Date  . CERVICAL CERCLAGE N/A 06/26/2014   Procedure: CERCLAGE CERVICAL;  Surgeon: Lenoard Aden, MD;   Location: [facility];  Service: Gynecology;  Laterality: N/A;  . DILATATION & CURETTAGE/HYSTEROSCOPY WITH MYOSURE N/A 02/16/2017   Procedure: DILATATION & CURETTAGE/HYSTEROSCOPY WITH MYOSURE RESECTION OF ENDOMETRIAL POLYP;  Surgeon: Olivia Mackie, MD;  Location: [facility];  Service: Gynecology;  Laterality: N/A;  . 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 TONSILLECTOMY    . tubes in ears     as child  . WISDOM TOOTH EXTRACTION      FAMILY HISTORY: Family History  Problem Relation Age of Onset  . Diabetes Mother   . Hypertension Mother   . Hypertension Father   . Alcohol abuse Maternal Grandmother   . Hearing loss Paternal Grandfather     SOCIAL HISTORY:  Social History   Socioeconomic History  . Marital status: Married    Spouse name: Not on file  . Number of children: 1  .  Years of education: Not on file  . Highest education level: Not on file  Occupational History  . Occupation: Stay at home mother  Tobacco Use  . Smoking status: Never Smoker  . Smokeless tobacco: Never Used  Vaping Use  . Vaping Use: Never used  Substance and Sexual Activity  . Alcohol use: No  . Drug use: No  . Sexual activity: Yes    Partners: Male    Birth control/protection: None  Other Topics Concern  . Not on file  Social History Narrative   Married: no kids   Regular exercise: not at this time   Caffeine use: no   Lives with husband   Social Determinants of Corporate investment banker Strain: Not on file  Food Insecurity: Not on file  Transportation Needs: Not on file  Physical Activity: Not on file  Stress: Not on file  Social Connections: Not on file  Intimate Partner Violence: Not on file     PHYSICAL EXAM  Vitals:   06/09/20 1111  BP: 124/78  Pulse: 69  Weight: 182 lb (82.6 kg)  Height: 5\' 4"  (1.626 m)    Body mass index is 31.24 kg/m.   General: The patient is well-developed and well-nourished and in no acute distress  HEENT:  Head is [place]/AT.  Sclera are anicteric.     Neck: No carotid bruits are noted.  The neck is nontender.  Cardiovascular: The heart has a regular rate and rhythm with a normal S1 and S2. There were no murmurs, gallops or rubs.    Skin: Extremities are without rash or  edema.  Musculoskeletal:  Back is nontender  Neurologic Exam  Mental status: The patient is alert and oriented x 3 at the time of the examination. The patient has apparent normal recent and remote memory, with an apparently normal attention span and concentration ability.   Speech is normal.  Cranial nerves: Extraocular movements are full. Pupils are equal, round, and reactive to light and accomodation.  Visual fields are full.  Facial symmetry is present. There is good facial sensation to soft touch bilaterally.Facial strength is normal.  Trapezius and sternocleidomastoid strength is normal. No dysarthria is noted.  The tongue is midline, and the patient has symmetric elevation of the soft palate. No obvious hearing deficits are noted.  Motor:  Muscle bulk is normal.   Tone is normal. Strength is  5 / 5 in all 4 extremities.   Sensory: Sensory testing is intact to pinprick, soft touch and vibration sensation in all 4 extremities.  Coordination: Cerebellar testing reveals good finger-nose-finger and heel-to-shin bilaterally.  Gait and station: Station is normal.   Gait is normal. Tandem gait is normal. Romberg is negative.   Reflexes: Deep tendon reflexes are symmetric and normal bilaterally.   Plantar responses are flexor.    DIAGNOSTIC DATA (LABS, IMAGING, TESTING) - I reviewed patient records, labs, notes, testing and imaging myself where available.  Lab Results  Component Value Date   WBC 3.8 (L) 05/20/2020   HGB 13.0 05/20/2020   HCT 38.7 05/20/2020   MCV 93.7 05/20/2020   PLT 129 (L) 05/20/2020      Component Value Date/Time   NA 142 05/20/2020 0435   K 3.8 05/20/2020 0435   CL 110 05/20/2020 0435   CO2 21 (L) 05/20/2020 0435   GLUCOSE 80  05/20/2020 0435   BUN 5 (L) 05/20/2020 0435   CREATININE 0.63 05/20/2020 0435   CALCIUM 8.3 (L) 05/20/2020 0435  PROT 5.4 (L) 05/20/2020 0435   ALBUMIN 3.1 (L) 05/20/2020 0435   AST 20 05/20/2020 0435   ALT 39 05/20/2020 0435   ALKPHOS 51 05/20/2020 0435   BILITOT 0.8 05/20/2020 0435   GFRNONAA >60 05/20/2020 0435   GFRAA >60 09/26/2014 1234    Lab Results  Component Value Date   TSH 1.366 05/20/2020       ASSESSMENT AND PLAN  Nonintractable juvenile myoclonic epilepsy without status epilepticus (HCC)  Pregnancy, unspecified gestational age   In summary, Ms. Fontaine No is a 35 year old woman who was diagnosed with juvenile myoclonic epilepsy at age 42 and had the first 2 generalized tonic-clonic seizures of her life 3 weeks ago on 05/19/2020.   She is completely back to her baseline.  We discussed treatment during pregnancy and afterwards.  Currently she is on a very low-dose of lamotrigine and levetiracetam.  We will keep the levetiracetam dose at 750 mg twice a day and titrate her lamotrigine dose up to 100 mg twice a day.  If she has complete resolution of the myoclonic jerks, I would consider stopping the Keppra and keeping her on lamotrigine monotherapy.  Hopefully she will do well during her pregnancy.  Adjustments in dose of her medications can be made if she has breakthrough tonic-clonic seizure activity.  We discussed the risk of rash with lamotrigine and to stop the medication if that occurs.  After she delivers, we continue to keep her on the lamotrigine plus/minus levetiracetam or switch to Topamax or zonisamide.  Thank you for asking to see Ms. [name].  Please let me know if I can be of further assistance with her or other patients in the future.  She will return to see [place] in 3 months or sooner for new or worsening neurologic symptoms.   Caprina Wussow A. Epimenio Foot, MD, [facility] 06/09/2020, 1:11 PM Certified in Neurology, Clinical Neurophysiology, Sleep Medicine and  Neuroimaging  [facility] [address], [address] [place], [place] [address] [phone]

## 2020-06-12 ENCOUNTER — Other Ambulatory Visit: Payer: Self-pay | Admitting: Obstetrics and Gynecology

## 2020-06-15 ENCOUNTER — Other Ambulatory Visit: Payer: Self-pay

## 2020-06-15 ENCOUNTER — Other Ambulatory Visit (HOSPITAL_COMMUNITY)
Admission: RE | Admit: 2020-06-15 | Discharge: 2020-06-15 | Disposition: A | Discharge status: Home / Self Care | Payer: 59 | Source: Ambulatory Visit | Attending: Obstetrics and Gynecology | Admitting: Obstetrics and Gynecology

## 2020-06-15 ENCOUNTER — Encounter (HOSPITAL_BASED_OUTPATIENT_CLINIC_OR_DEPARTMENT_OTHER): Payer: Self-pay | Admitting: Obstetrics and Gynecology

## 2020-06-15 DIAGNOSIS — Z01812 Encounter for preprocedural laboratory examination: Secondary | ICD-10-CM | POA: Diagnosis present

## 2020-06-15 DIAGNOSIS — Z20822 Contact with and (suspected) exposure to covid-19: Secondary | ICD-10-CM | POA: Insufficient documentation

## 2020-06-15 LAB — SARS CORONAVIRUS 2 (TAT 6-24 HRS): SARS Coronavirus 2: NEGATIVE

## 2020-06-15 NOTE — Progress Notes (Addendum)
ADDENDUM:  Chart reviewed by Dr Judie Petit. [facility] , ok to proceed.   Spoke w/ via phone for pre-op interview--- PT Lab needs dos---- CBC, T&S              Lab results------ current ekg/ head ct in epic COVID test ------ done 06-15-2020 result in tpi Arrive at ------- 1115 on 06-17-2020 NPO after MN NO Solid Food.  Clear liquids from MN until--- 1015 Medications to take morning of surgery ----- Lamictal, Keppra, Protonix Diabetic medication ----- n/a Patient Special Instructions ----- n/a Pre-Op special Istructions ----- n/a Patient verbalized understanding of instructions that were given at this phone interview. Patient denies shortness of breath, chest pain, fever, cough at this phone interview.   Anesthesia :  Dx Myoclonic epilepsy age 35;  Recent ED visit / 23 hr obs for generalized tonic clonic seizures x2,  first one witness by husband , second one witness in ED.  Pt denies any seizure since 05-19-2020 only have intermittent myoclonic jerks with medication adjustment and is taking medication as prescribed.   Neurologist:  Dr Epimenio Foot (lov 06-09-2020 epic) CT head:  05-19-2020 EKG :  05-19-2020

## 2020-06-17 ENCOUNTER — Ambulatory Visit (HOSPITAL_BASED_OUTPATIENT_CLINIC_OR_DEPARTMENT_OTHER): Payer: 59 | Admitting: Anesthesiology

## 2020-06-17 ENCOUNTER — Other Ambulatory Visit: Payer: Self-pay

## 2020-06-17 ENCOUNTER — Encounter (HOSPITAL_BASED_OUTPATIENT_CLINIC_OR_DEPARTMENT_OTHER)
Admission: RE | Disposition: A | Discharge status: Home / Self Care | Payer: Self-pay | Source: Home / Self Care | Attending: Obstetrics and Gynecology

## 2020-06-17 ENCOUNTER — Ambulatory Visit (HOSPITAL_BASED_OUTPATIENT_CLINIC_OR_DEPARTMENT_OTHER)
Admission: RE | Admit: 2020-06-17 | Discharge: 2020-06-17 | Disposition: A | Discharge status: Home / Self Care | Payer: 59 | Attending: Obstetrics and Gynecology | Admitting: Obstetrics and Gynecology

## 2020-06-17 ENCOUNTER — Encounter (HOSPITAL_BASED_OUTPATIENT_CLINIC_OR_DEPARTMENT_OTHER): Payer: Self-pay | Admitting: Obstetrics and Gynecology

## 2020-06-17 DIAGNOSIS — Z9884 Bariatric surgery status: Secondary | ICD-10-CM | POA: Insufficient documentation

## 2020-06-17 DIAGNOSIS — Z79899 Other long term (current) drug therapy: Secondary | ICD-10-CM | POA: Diagnosis not present

## 2020-06-17 DIAGNOSIS — O021 Missed abortion: Secondary | ICD-10-CM | POA: Insufficient documentation

## 2020-06-17 DIAGNOSIS — Z833 Family history of diabetes mellitus: Secondary | ICD-10-CM | POA: Diagnosis not present

## 2020-06-17 DIAGNOSIS — Z8249 Family history of ischemic heart disease and other diseases of the circulatory system: Secondary | ICD-10-CM | POA: Diagnosis not present

## 2020-06-17 DIAGNOSIS — G40409 Other generalized epilepsy and epileptic syndromes, not intractable, without status epilepticus: Secondary | ICD-10-CM | POA: Diagnosis not present

## 2020-06-17 HISTORY — DX: Personal history of other diseases of the nervous system and sense organs: Z86.69

## 2020-06-17 HISTORY — DX: Major depressive disorder, single episode, unspecified: F32.9

## 2020-06-17 HISTORY — DX: Unspecified osteoarthritis, unspecified site: M19.90

## 2020-06-17 HISTORY — DX: Generalized anxiety disorder: F41.1

## 2020-06-17 HISTORY — DX: Juvenile myoclonic epilepsy, not intractable, without status epilepticus: G40.B09

## 2020-06-17 HISTORY — DX: Personal history of traumatic brain injury: Z87.820

## 2020-06-17 HISTORY — PX: DILATION AND EVACUATION: SHX1459

## 2020-06-17 LAB — CBC
HCT: 42.6 % (ref 36.0–46.0)
Hemoglobin: 14.4 g/dL (ref 12.0–15.0)
MCH: 31.4 pg (ref 26.0–34.0)
MCHC: 33.8 g/dL (ref 30.0–36.0)
MCV: 93 fL (ref 80.0–100.0)
Platelets: 223 10*3/uL (ref 150–400)
RBC: 4.58 MIL/uL (ref 3.87–5.11)
RDW: 13.6 % (ref 11.5–15.5)
WBC: 7.3 10*3/uL (ref 4.0–10.5)
nRBC: 0 % (ref 0.0–0.2)

## 2020-06-17 LAB — TYPE AND SCREEN
ABO/RH(D): B POS
Antibody Screen: NEGATIVE

## 2020-06-17 SURGERY — DILATION AND EVACUATION, UTERUS
Anesthesia: General | Site: Vagina

## 2020-06-17 MED ORDER — FENTANYL CITRATE (PF) 100 MCG/2ML IJ SOLN
INTRAMUSCULAR | Status: DC | PRN
  Administered 2020-06-17: 50 ug via INTRAVENOUS

## 2020-06-17 MED ORDER — ONDANSETRON HCL 4 MG/2ML IJ SOLN: 4.0000 mg | Freq: Once | INTRAMUSCULAR | Status: DC | PRN

## 2020-06-17 MED ORDER — PROPOFOL 10 MG/ML IV BOLUS
INTRAVENOUS | Status: AC
  Filled 2020-06-17: qty 20

## 2020-06-17 MED ORDER — FENTANYL CITRATE (PF) 100 MCG/2ML IJ SOLN
INTRAMUSCULAR | Status: AC
  Filled 2020-06-17: qty 2

## 2020-06-17 MED ORDER — POVIDONE-IODINE 10 % EX SWAB: 2.0000 "application " | Freq: Once | CUTANEOUS | Status: DC

## 2020-06-17 MED ORDER — ONDANSETRON HCL 4 MG/2ML IJ SOLN
INTRAMUSCULAR | Status: DC | PRN
  Administered 2020-06-17 (×2): 4 mg via INTRAVENOUS

## 2020-06-17 MED ORDER — LIDOCAINE 2% (20 MG/ML) 5 ML SYRINGE
INTRAMUSCULAR | Status: DC | PRN
  Administered 2020-06-17: 80 mg via INTRAVENOUS

## 2020-06-17 MED ORDER — LIDOCAINE HCL (PF) 2 % IJ SOLN
INTRAMUSCULAR | Status: AC
  Filled 2020-06-17: qty 5

## 2020-06-17 MED ORDER — CEFAZOLIN SODIUM-DEXTROSE 2-4 GM/100ML-% IV SOLN
INTRAVENOUS | Status: AC
  Filled 2020-06-17: qty 100

## 2020-06-17 MED ORDER — LACTATED RINGERS IV SOLN: INTRAVENOUS | Status: DC

## 2020-06-17 MED ORDER — AMISULPRIDE (ANTIEMETIC) 5 MG/2ML IV SOLN: 10.0000 mg | Freq: Once | INTRAVENOUS | Status: DC | PRN

## 2020-06-17 MED ORDER — PROPOFOL 10 MG/ML IV BOLUS
INTRAVENOUS | Status: DC | PRN
  Administered 2020-06-17: 200 mg via INTRAVENOUS

## 2020-06-17 MED ORDER — MIDAZOLAM HCL 2 MG/2ML IJ SOLN
INTRAMUSCULAR | Status: AC
  Filled 2020-06-17: qty 2

## 2020-06-17 MED ORDER — CEFAZOLIN SODIUM-DEXTROSE 2-4 GM/100ML-% IV SOLN
2.0000 g | INTRAVENOUS | Status: AC
  Administered 2020-06-17: 2 g via INTRAVENOUS

## 2020-06-17 MED ORDER — FENTANYL CITRATE (PF) 100 MCG/2ML IJ SOLN: 25.0000 ug | INTRAMUSCULAR | Status: DC | PRN

## 2020-06-17 MED ORDER — DEXAMETHASONE SODIUM PHOSPHATE 10 MG/ML IJ SOLN
INTRAMUSCULAR | Status: DC | PRN
  Administered 2020-06-17: 5 mg via INTRAVENOUS

## 2020-06-17 MED ORDER — DEXMEDETOMIDINE (PRECEDEX) IN NS 20 MCG/5ML (4 MCG/ML) IV SYRINGE
PREFILLED_SYRINGE | INTRAVENOUS | Status: DC | PRN
  Administered 2020-06-17: 4 ug via INTRAVENOUS
  Administered 2020-06-17: 8 ug via INTRAVENOUS

## 2020-06-17 MED ORDER — DEXMEDETOMIDINE (PRECEDEX) IN NS 20 MCG/5ML (4 MCG/ML) IV SYRINGE
PREFILLED_SYRINGE | INTRAVENOUS | Status: AC
  Filled 2020-06-17: qty 5

## 2020-06-17 MED ORDER — OXYCODONE HCL 5 MG PO TABS
5.0000 mg | ORAL_TABLET | Freq: Once | ORAL | Status: DC | PRN
Start: 2020-06-17 — End: 2020-06-17

## 2020-06-17 MED ORDER — MIDAZOLAM HCL 2 MG/2ML IJ SOLN
INTRAMUSCULAR | Status: DC | PRN
  Administered 2020-06-17: 2 mg via INTRAVENOUS

## 2020-06-17 MED ORDER — BUPIVACAINE HCL 0.25 % IJ SOLN
INTRAMUSCULAR | Status: DC | PRN
  Administered 2020-06-17: 20 mL

## 2020-06-17 MED ORDER — OXYCODONE HCL 5 MG/5ML PO SOLN: 5.0000 mg | Freq: Once | ORAL | Status: DC | PRN

## 2020-06-17 SURGICAL SUPPLY — 19 items
CATH ROBINSON RED A/P 16FR (CATHETERS) ×2 IMPLANT
COVER WAND RF STERILE (DRAPES) ×2 IMPLANT
FILTER UTR ASPR ASSEMBLY (MISCELLANEOUS) ×2 IMPLANT
GLOVE SURG ENC MOIS LTX SZ7.5 (GLOVE) ×2 IMPLANT
GOWN STRL REUS W/TWL XL LVL3 (GOWN DISPOSABLE) ×2 IMPLANT
HOSE CONNECTING 18IN BERKELEY (TUBING) ×2 IMPLANT
KIT BERKELEY 1ST TRI 3/8 NO TR (MISCELLANEOUS) IMPLANT
KIT BERKELEY 1ST TRIMESTER 3/8 (MISCELLANEOUS) ×4 IMPLANT
KIT TURNOVER CYSTO (KITS) ×2 IMPLANT
NS IRRIG 500ML POUR BTL (IV SOLUTION) IMPLANT
PACK VAGINAL MINOR WOMEN LF (CUSTOM PROCEDURE TRAY) ×2 IMPLANT
PAD OB MATERNITY 4.3X12.25 (PERSONAL CARE ITEMS) IMPLANT
PAD PREP 24X48 CUFFED NSTRL (MISCELLANEOUS) ×2 IMPLANT
SET BERKELEY SUCTION TUBING (SUCTIONS) ×2 IMPLANT
TOWEL OR 17X26 10 PK STRL BLUE (TOWEL DISPOSABLE) ×2 IMPLANT
VACURETTE 10 RIGID CVD (CANNULA) IMPLANT
VACURETTE 7MM CVD STRL WRAP (CANNULA) ×1 IMPLANT
VACURETTE 8 RIGID CVD (CANNULA) IMPLANT
VACURETTE 9 RIGID CVD (CANNULA) IMPLANT

## 2020-06-17 NOTE — Anesthesia Postprocedure Evaluation (Signed)
Anesthesia Post Note  Patient: Ceanna L Riggins-Bogue  Procedure(s) Performed: DILATATION AND EVACUATION (N/A Vagina )     Patient location during evaluation: PACU Anesthesia Type: General Level of consciousness: awake and alert Pain management: pain level controlled Vital Signs Assessment: post-procedure vital signs reviewed and stable Respiratory status: spontaneous breathing, nonlabored ventilation and respiratory function stable Cardiovascular status: blood pressure returned to baseline and stable Postop Assessment: no apparent nausea or vomiting Anesthetic complications: no   No complications documented.  Last Vitals:  Vitals:   06/17/20 1344 06/17/20 1345  BP: 112/64 112/67  Pulse: 73 69  Resp: 15 16  Temp: 36.5 C   SpO2: 98% 98%    Last Pain:  Vitals:   06/17/20 1350  TempSrc:   PainSc: 0-No pain                 Lucretia Kern

## 2020-06-17 NOTE — H&P (Signed)
Brooke [name] is an 35 y.o. female. MAB for E&E  Pertinent Gynecological History: Menses: flow is moderate Bleeding: dysfunctional uterine bleeding Contraception: none DES exposure: unknown Blood transfusions: none Sexually transmitted diseases: no past history Previous GYN Procedures: DNC  Last mammogram: na Date: na Last pap: abnormal: lgsil Date: 2021 OB History: G2, P1   Menstrual History: Menarche age: 46 Patient's last menstrual period was 03/22/2020.    Past Medical History:  Diagnosis Date  . Alopecia    wears hair topper -  . Anxiety   . Arthritis   . GAD (generalized anxiety disorder)   . GERD (gastroesophageal reflux disease)   . History of concussion age 13   per pt hit in head w/ softball , forehead area,  no loc had multiple stitches  . History of hypertension 2016   06-15-2020  per pt resolved after gastric bypass 09/ 2021  . History of obstructive sleep apnea    per pt tested prior to gatric bypass w/ severe osa wear apap,  retested 3 weeks after gastric bypass (s/p 02-03-2020) told no longer had osa  . MDD (major depressive disorder)   . Migraines   . Nonintractable juvenile myoclonic epilepsy without status epilepticus [facility]) age 30   (06-15-2020  per pt no seizure since 05-19-2020)   neurologist--- dr Epimenio Foot--- pt previously seen by dr hickling/ dr Modesto Charon,  pt was referred to dr sater from ED visit / overnight stay for pt's first two generalized tonic clonic seizure's , pt had stated unable to sleep (second one was witnessed in ED);  per dr sater note in epic 06-09-2020 pt is back to baseline with intermittant myoclinic jerks with medication adjustement, normal head CT 05-19-2020  . PCOS (polycystic ovarian syndrome)     Past Surgical History:  Procedure Laterality Date  . CERVICAL CERCLAGE N/A 06/26/2014   Procedure: CERCLAGE CERVICAL;  Surgeon: Lenoard Aden, MD;  Location: [facility];  Service: Gynecology;  Laterality: N/A;  . DILATATION &  CURETTAGE/HYSTEROSCOPY WITH MYOSURE N/A 02/16/2017   Procedure: DILATATION & CURETTAGE/HYSTEROSCOPY WITH MYOSURE RESECTION OF ENDOMETRIAL POLYP;  Surgeon: Olivia Mackie, MD;  Location: [facility];  Service: Gynecology;  Laterality: N/A;  . ROUX-EN-Y GASTRIC BYPASS  02-03-2020  @ [facility]   LAPAROSCOPIC  . TONSILLECTOMY  CHILD  . TYMPANOSTOMY TUBE PLACEMENT Bilateral child  . WISDOM TOOTH EXTRACTION  age 78    Family History  Problem Relation Age of Onset  . Diabetes Mother   . Hypertension Mother   . Hypertension Father   . Alcohol abuse Maternal Grandmother   . Hearing loss Paternal Grandfather     Social History:  reports that she has never smoked. She has never used smokeless tobacco. She reports that she does not drink alcohol and does not use drugs.  Allergies: No Known Allergies  Medications Prior to Admission  Medication Sig Dispense Refill Last Dose  . lamoTRIgine (LAMICTAL) 100 MG tablet Take 1 tablet (100 mg total) by mouth 2 (two) times daily. 60 tablet 11 06/17/2020 at 0800  . lamoTRIgine (LAMICTAL) 25 MG tablet Take by mouth as directed. Pt is titrating up on this medication , 06-15-2020 taking 50 mg am and 25 mg qhs/  on 06-17-2020 50 mg in am and 50 mg qhs     . levETIRAcetam (KEPPRA) 750 MG tablet Take 1 tablet (750 mg total) by mouth 2 (two) times daily. (Patient taking differently: Take 750 mg by mouth 2 (two) times daily.) 60 tablet 0 06/17/2020 at  0800  . Multiple Vitamin (MULTIVITAMIN) tablet Take 1 tablet by mouth daily.   06/17/2020 at 0800  . OVER THE COUNTER MEDICATION BARIATRIC MVI  SUPPLEMENT PATCH   06/16/2020 at Unknown time  . pantoprazole (PROTONIX) 40 MG tablet Take 40 mg by mouth at bedtime.   06/16/2020 at Unknown time    Review of Systems  Constitutional: Negative.   Hematological: Negative for adenopathy.  All other systems reviewed and are negative.   Blood pressure 131/73, pulse (!) 59, temperature 98.7 F (37.1 C), temperature source Oral, resp. rate 16,  height 5\' 4"  (1.626 m), weight 80.7 kg, last menstrual period 03/22/2020, SpO2 100 %. Physical Exam Vitals and nursing note reviewed. Exam conducted with a chaperone present.  Constitutional:      Appearance: Normal appearance.  HENT:     Head: Normocephalic and atraumatic.  Cardiovascular:     Rate and Rhythm: Normal rate and regular rhythm.     Pulses: Normal pulses.     Heart sounds: Normal heart sounds.  Pulmonary:     Effort: Pulmonary effort is normal.     Breath sounds: Normal breath sounds.  Abdominal:     Palpations: Abdomen is soft.  Genitourinary:    General: Normal vulva.     Rectum: Normal.  Musculoskeletal:        General: Normal range of motion.     Cervical back: Normal range of motion and neck supple.  Skin:    General: Skin is warm and dry.  Neurological:     General: No focal deficit present.     Mental Status: She is alert and oriented to person, place, and time.  Psychiatric:        Mood and Affect: Mood normal.        Behavior: Behavior normal.     Results for orders placed or performed during the hospital encounter of 06/17/20 (from the past 24 hour(s))  Type and screen     Status: None   Collection Time: 06/17/20 11:36 AM  Result Value Ref Range   ABO/RH(D) B POS    Antibody Screen NEG    Sample Expiration      07/01/2020,2359 Performed at [facility], [address], [place], [place]   CBC     Status: None   Collection Time: 06/17/20 11:36 AM  Result Value Ref Range   WBC 7.3 4.0 - 10.5 K/uL   RBC 4.58 3.87 - 5.11 MIL/uL   Hemoglobin 14.4 12.0 - 15.0 g/dL   HCT 08/15/20 78.4 - 69.6 %   MCV 93.0 80.0 - 100.0 fL   MCH 31.4 26.0 - 34.0 pg   MCHC 33.8 30.0 - 36.0 g/dL   RDW 29.5 28.4 - 13.2 %   Platelets 223 150 - 400 K/uL   nRBC 0.0 0.0 - 0.2 %    No results found.  Assessment/Plan: MAB Declines expectant or med management Suction D&E  Consent done.  Briawna Carver J 06/17/2020, 1:03 PM

## 2020-06-17 NOTE — Discharge Instructions (Signed)
DISCHARGE INSTRUCTIONS:  D&E The following instructions have been prepared to help you care for yourself upon your return home.   Personal hygiene: Marland Kitchen Use sanitary pads for vaginal drainage, not tampons. . Shower the day after your procedure. . NO tub baths, pools or Jacuzzis for 2-3 weeks. . Wipe front to back after using the bathroom.  Activity and limitations: . Do NOT drive or operate any equipment for 24 hours. The effects of anesthesia are still present and drowsiness may result. . Do NOT rest in bed all day. . Walking is encouraged. . Walk up and down stairs slowly. . You may resume your normal activity in one to two days or as indicated by your physician.  Sexual activity: NO intercourse for at least 2 weeks after the procedure, or as indicated by your physician.  Diet: Eat a light meal as desired this evening. You may resume your usual diet tomorrow.  Return to work: You may resume your work activities in one to two days or as indicated by your doctor.  What to expect after your surgery: Expect to have vaginal bleeding/discharge for 2-3 days and spotting for up to 10 days. It is not unusual to have soreness for up to 1-2 weeks. You may have a slight burning sensation when you urinate for the first day. Mild cramps may continue for a couple of days. You may have a regular period in 2-6 weeks.  Call your doctor for any of the following: . Excessive vaginal bleeding, saturating and changing one pad every hour. . Inability to urinate 6 hours after discharge from hospital. . Pain not relieved by pain medication. . Fever of 100.4 F or greater. . Unusual vaginal discharge or odor.  Post Anesthesia Home Care Instructions  Activity: Get plenty of rest for the remainder of the day. A responsible individual must stay with you for 24 hours following the procedure.  For the next 24 hours, DO NOT: -Drive a car -Advertising copywriter -Drink alcoholic beverages -Take any medication unless  instructed by your physician -Make any legal decisions or sign important papers.  Meals: Start with liquid foods such as gelatin or soup. Progress to regular foods as tolerated. Avoid greasy, spicy, heavy foods. If nausea and/or vomiting occur, drink only clear liquids until the nausea and/or vomiting subsides. Call your physician if vomiting continues.  Special Instructions/Symptoms: Your throat may feel dry or sore from the anesthesia or the breathing tube placed in your throat during surgery. If this causes discomfort, gargle with warm salt water. The discomfort should disappear within 24 hours.   Call Dr. Billy Coast today or tomorrow to schedule appointment. To be seen 3-4 weeks.

## 2020-06-17 NOTE — Op Note (Signed)
NAME: Brooke, [name] MEDICAL RECORD [id number] ACCOUNT [id number] DATE OF BIRTH:[birth year] FACILITY: [facility] LOCATION: [facility] PHYSICIAN:Nuri Branca J. Billy Coast, MD  OPERATIVE REPORT  DATE OF PROCEDURE:  06/17/2020  PREOPERATIVE DIAGNOSIS:  Missed abortion.  POSTOPERATIVE DIAGNOSIS:  Missed abortion.  PROCEDURE:  Suction dilation and evacuation.  SURGEON:  Olivia Mackie, MD  ASSISTANT:  None.  ANESTHESIA:  Local and IV sedation.  SPECIMENS:  Products of conception to pathology.  DISPOSITION:  The patient was taken to recovery in good condition.  No complications.  BRIEF OPERATIVE NOTE:  After being apprised of the risks of anesthesia, infection, bleeding, injury to surrounding organs, possible need for repair, delayed versus immediate complications including bowel and bladder injury with possible need for repair,  the patient was brought to the operating room, was administered IV sedation without difficulty, prepped and draped in usual sterile fashion, catheterized until the bladder was empty.  Exam under anesthesia reveals a 6-8 week size anteflexed uterus, no  adnexal masses appreciated at this time.  Dilute Marcaine solution placed, standard paracervical block, 20 mL total.  Cervix easily dilated up to a #21 Pratt dilator.  A curved 7 mm suction curette placed.  Products of conception aspirated without  difficulty.  Repeat suction and blunt curettage revealed the cavity to be empty.  Minimal bleeding noted.  The patient tolerated the procedure well, was awakened and transferred to recovery in good condition.  HN/NUANCE  D:06/17/2020 T:06/17/2020 JOB:[id number]

## 2020-06-17 NOTE — Anesthesia Preprocedure Evaluation (Signed)
Anesthesia Evaluation  Patient identified by MRN, date of birth, ID band Patient awake    Reviewed: Allergy & Precautions, NPO status , Patient's Chart, lab work & pertinent test results  History of Anesthesia Complications Negative for: history of anesthetic complications  Airway Mallampati: II  TM Distance: >3 FB Neck ROM: Full    Dental  (+) Teeth Intact   Pulmonary asthma , sleep apnea ,    Pulmonary exam normal        Cardiovascular hypertension, Normal cardiovascular exam     Neuro/Psych Seizures -,  Anxiety Depression    GI/Hepatic Neg liver ROS, GERD  ,  Endo/Other  PCOS  Renal/GU negative Renal ROS  negative genitourinary   Musculoskeletal negative musculoskeletal ROS (+)   Abdominal   Peds  Hematology negative hematology ROS (+)   Anesthesia Other Findings   Reproductive/Obstetrics                             Anesthesia Physical Anesthesia Plan  ASA: III  Anesthesia Plan: General   Post-op Pain Management:    Induction: Intravenous  PONV Risk Score and Plan: Ondansetron, Dexamethasone, Midazolam and Treatment may vary due to age or medical condition  Airway Management Planned: LMA  Additional Equipment: None  Intra-op Plan:   Post-operative Plan: Extubation in OR  Informed Consent: I have reviewed the patients History and Physical, chart, labs and discussed the procedure including the risks, benefits and alternatives for the proposed anesthesia with the patient or authorized representative who has indicated his/her understanding and acceptance.     Dental advisory given  Plan Discussed with:   Anesthesia Plan Comments:         Anesthesia Quick Evaluation

## 2020-06-17 NOTE — Op Note (Signed)
06/17/2020  1:35 PM  PATIENT:  Brooke [name]  35 y.o. female  PRE-OPERATIVE DIAGNOSIS:  Missed Abortion  POST-OPERATIVE DIAGNOSIS:  Missed Abortion  PROCEDURE:  Procedure(s): DILATATION AND EVACUATION  SURGEON:  Surgeon(s): Olivia Mackie, MD  ASSISTANTS: none   ANESTHESIA:   local and IV sedation  ESTIMATED BLOOD LOSS: 50 mL   DRAINS: none   LOCAL MEDICATIONS USED:  MARCAINE    and Amount: 20 ml  SPECIMEN:  Source of Specimen:  poc  DISPOSITION OF SPECIMEN:  PATHOLOGY  COUNTS:  YES  DICTATION #: [id number]  PLAN OF CARE: dc home  PATIENT DISPOSITION:  PACU - hemodynamically stable.

## 2020-06-17 NOTE — Transfer of Care (Signed)
Immediate Anesthesia Transfer of Care Note  Patient: Brooke [name]  Procedure(s) Performed: DILATATION AND EVACUATION (N/A Vagina )  Patient Location: PACU  Anesthesia Type:General  Level of Consciousness: drowsy  Airway & Oxygen Therapy: Patient Spontanous Breathing and Patient connected to nasal cannula oxygen  Post-op Assessment: Report given to RN and Post -op Vital signs reviewed and stable  Post vital signs: Reviewed and stable  Last Vitals:  Vitals Value Taken Time  BP 112/67 06/17/20 1345  Temp    Pulse 66 06/17/20 1347  Resp 14 06/17/20 1347  SpO2 99 % 06/17/20 1347  Vitals shown include unvalidated device data.  Last Pain:  Vitals:   06/17/20 1151  TempSrc: Oral  PainSc: 0-No pain      Patients Stated Pain Goal: 3 (06/17/20 1151)  Complications: No complications documented.

## 2020-06-17 NOTE — Anesthesia Procedure Notes (Signed)
Procedure Name: LMA Insertion Date/Time: 06/17/2020 1:24 PM Performed by: Francie Massing, CRNA Pre-anesthesia Checklist: Patient identified, Emergency Drugs available, Suction available and Patient being monitored Patient Re-evaluated:Patient Re-evaluated prior to induction Oxygen Delivery Method: Circle system utilized Preoxygenation: Pre-oxygenation with 100% oxygen Induction Type: IV induction Ventilation: Mask ventilation without difficulty LMA: LMA inserted LMA Size: 4.0 Number of attempts: 1 Airway Equipment and Method: Patient positioned with wedge pillow Placement Confirmation: positive ETCO2 and breath sounds checked- equal and bilateral Tube secured with: Tape Dental Injury: Teeth and Oropharynx as per pre-operative assessment

## 2020-06-18 LAB — SURGICAL PATHOLOGY

## 2020-06-19 ENCOUNTER — Telehealth: Payer: Self-pay | Admitting: Neurology

## 2020-06-19 ENCOUNTER — Encounter (HOSPITAL_BASED_OUTPATIENT_CLINIC_OR_DEPARTMENT_OTHER): Payer: Self-pay | Admitting: Obstetrics and Gynecology

## 2020-06-19 MED ORDER — LEVETIRACETAM 750 MG PO TABS: 750.0000 mg | ORAL_TABLET | Freq: Two times a day (BID) | ORAL | 11 refills | Status: DC

## 2020-06-19 NOTE — Telephone Encounter (Signed)
Patient called on-call physician, need to refill of Keppra 750 twice a day  Meds ordered this encounter  Medications  . levETIRAcetam (KEPPRA) 750 MG tablet    Sig: Take 1 tablet (750 mg total) by mouth 2 (two) times daily.    Dispense:  60 tablet    Refill:  11

## 2020-07-02 NOTE — Telephone Encounter (Signed)
Reviewed her most recent charge, Dr. Epimenio Foot intended to keep her on monotherapy higher dose of lamotrigine,  Please call her, I was called is she doing well with current dose of lamotrigine 200 mg twice a day, if monotherapy have her symptoms under good control, if she has no frequent myoclonic activity, no recurrent seizure,  May consider keep current dose of lamotrigine 200 mg twice a day, gradually wean off Keppra 750 mg half tablets twice a day for 1 week, half tablet for 1 week, then stop,  Call back clinic for recurrent symptoms.

## 2020-07-07 DIAGNOSIS — R87612 Low grade squamous intraepithelial lesion on cytologic smear of cervix (LGSIL): Secondary | ICD-10-CM | POA: Insufficient documentation

## 2020-07-30 ENCOUNTER — Encounter (INDEPENDENT_AMBULATORY_CARE_PROVIDER_SITE_OTHER): Payer: Self-pay | Admitting: Family Medicine

## 2020-07-30 ENCOUNTER — Ambulatory Visit (INDEPENDENT_AMBULATORY_CARE_PROVIDER_SITE_OTHER): Payer: 59 | Admitting: Family Medicine

## 2020-07-30 ENCOUNTER — Other Ambulatory Visit: Payer: Self-pay

## 2020-07-30 VITALS — BP 103/67 | HR 61 | Temp 98.1°F | Ht 64.0 in | Wt 166.0 lb

## 2020-07-30 DIAGNOSIS — G40802 Other epilepsy, not intractable, without status epilepticus: Secondary | ICD-10-CM

## 2020-07-30 DIAGNOSIS — F419 Anxiety disorder, unspecified: Secondary | ICD-10-CM | POA: Diagnosis not present

## 2020-07-30 DIAGNOSIS — R5383 Other fatigue: Secondary | ICD-10-CM | POA: Diagnosis not present

## 2020-07-30 DIAGNOSIS — Z0289 Encounter for other administrative examinations: Secondary | ICD-10-CM

## 2020-07-30 DIAGNOSIS — R7301 Impaired fasting glucose: Secondary | ICD-10-CM

## 2020-07-30 DIAGNOSIS — Z9189 Other specified personal risk factors, not elsewhere classified: Secondary | ICD-10-CM

## 2020-07-30 DIAGNOSIS — R0602 Shortness of breath: Secondary | ICD-10-CM

## 2020-07-30 DIAGNOSIS — F32A Depression, unspecified: Secondary | ICD-10-CM

## 2020-07-30 DIAGNOSIS — E46 Unspecified protein-calorie malnutrition: Secondary | ICD-10-CM

## 2020-07-30 DIAGNOSIS — E282 Polycystic ovarian syndrome: Secondary | ICD-10-CM | POA: Diagnosis not present

## 2020-07-30 DIAGNOSIS — E669 Obesity, unspecified: Secondary | ICD-10-CM

## 2020-07-30 DIAGNOSIS — Z6830 Body mass index (BMI) 30.0-30.9, adult: Secondary | ICD-10-CM

## 2020-07-30 DIAGNOSIS — Z8759 Personal history of other complications of pregnancy, childbirth and the puerperium: Secondary | ICD-10-CM

## 2020-07-30 DIAGNOSIS — Z9884 Bariatric surgery status: Secondary | ICD-10-CM

## 2020-07-31 LAB — COMPREHENSIVE METABOLIC PANEL
ALT: 27 IU/L (ref 0–32)
AST: 17 IU/L (ref 0–40)
Albumin/Globulin Ratio: 2.4 — ABNORMAL HIGH (ref 1.2–2.2)
Albumin: 4.8 g/dL (ref 3.8–4.8)
Alkaline Phosphatase: 87 IU/L (ref 44–121)
BUN/Creatinine Ratio: 17 (ref 9–23)
BUN: 13 mg/dL (ref 6–20)
Bilirubin Total: 0.5 mg/dL (ref 0.0–1.2)
CO2: 24 mmol/L (ref 20–29)
Calcium: 9.8 mg/dL (ref 8.7–10.2)
Chloride: 103 mmol/L (ref 96–106)
Creatinine, Ser: 0.77 mg/dL (ref 0.57–1.00)
Globulin, Total: 2 g/dL (ref 1.5–4.5)
Glucose: 86 mg/dL (ref 65–99)
Potassium: 4.5 mmol/L (ref 3.5–5.2)
Sodium: 140 mmol/L (ref 134–144)
Total Protein: 6.8 g/dL (ref 6.0–8.5)
eGFR: 102 mL/min/{1.73_m2} (ref >59)

## 2020-07-31 LAB — CBC WITH DIFFERENTIAL/PLATELET
Basophils Absolute: 0 10*3/uL (ref 0.0–0.2)
Basos: 1 %
EOS (ABSOLUTE): 0.1 10*3/uL (ref 0.0–0.4)
Eos: 2 %
Hemoglobin: 13.9 g/dL (ref 11.1–15.9)
Immature Grans (Abs): 0 10*3/uL (ref 0.0–0.1)
Immature Granulocytes: 0 %
Lymphocytes Absolute: 2.2 10*3/uL (ref 0.7–3.1)
Lymphs: 42 %
MCH: 32 pg (ref 26.6–33.0)
MCHC: 33.3 g/dL (ref 31.5–35.7)
MCV: 96 fL (ref 79–97)
Monocytes Absolute: 0.3 10*3/uL (ref 0.1–0.9)
Monocytes: 7 %
Neutrophils Absolute: 2.6 10*3/uL (ref 1.4–7.0)
Neutrophils: 48 %
Platelets: 263 10*3/uL (ref 150–450)
RBC: 4.34 x10E6/uL (ref 3.77–5.28)
RDW: 12.8 % (ref 11.7–15.4)
WBC: 5.2 10*3/uL (ref 3.4–10.8)

## 2020-07-31 LAB — ANEMIA PANEL
Ferritin: 112 ng/mL (ref 15–150)
Folate, Hemolysate: 416 ng/mL
Folate, RBC: 998 ng/mL (ref >498)
Hematocrit: 41.7 % (ref 34.0–46.6)
Iron Saturation: 34 % (ref 15–55)
Iron: 91 ug/dL (ref 27–159)
Retic Ct Pct: 1.5 % (ref 0.6–2.6)
Total Iron Binding Capacity: 269 ug/dL (ref 250–450)
UIBC: 178 ug/dL (ref 131–425)
Vitamin B-12: 546 pg/mL (ref 232–1245)

## 2020-07-31 LAB — HEMOGLOBIN A1C
Est. average glucose Bld gHb Est-mCnc: 97 mg/dL
Hgb A1c MFr Bld: 5 % (ref 4.8–5.6)

## 2020-07-31 LAB — LIPID PANEL
Chol/HDL Ratio: 2.5 ratio (ref 0.0–4.4)
Cholesterol, Total: 151 mg/dL (ref 100–199)
HDL: 61 mg/dL (ref >39)
LDL Chol Calc (NIH): 74 mg/dL (ref 0–99)
Triglycerides: 83 mg/dL (ref 0–149)
VLDL Cholesterol Cal: 16 mg/dL (ref 5–40)

## 2020-07-31 LAB — INSULIN, RANDOM: INSULIN: 5.4 u[IU]/mL (ref 2.6–24.9)

## 2020-07-31 LAB — TSH: TSH: 1.34 u[IU]/mL (ref 0.450–4.500)

## 2020-07-31 LAB — PTH, INTACT AND CALCIUM: PTH: 18 pg/mL (ref 15–65)

## 2020-07-31 LAB — T4, FREE: Free T4: 1.3 ng/dL (ref 0.82–1.77)

## 2020-07-31 LAB — VITAMIN D 25 HYDROXY (VIT D DEFICIENCY, FRACTURES): Vit D, 25-Hydroxy: 43.3 ng/mL (ref 30.0–100.0)

## 2020-07-31 LAB — MAGNESIUM: Magnesium: 2.1 mg/dL (ref 1.6–2.3)

## 2020-08-05 NOTE — Progress Notes (Signed)
Dear Dr. Billy Coast,   Thank you for referring Brooke [name] to our clinic. The following note includes my evaluation and treatment recommendations.  Chief Complaint:   OBESITY Brooke [name] (MR# [id number]) is a 35 y.o. female who presents for evaluation and treatment of obesity and related comorbidities. Current BMI is Body mass index is 28.49 kg/m. Brooke [name] has been struggling with her weight for many years and has been unsuccessful in either losing weight, maintaining weight loss, or reaching her healthy weight goal.  Brooke [name] is currently in the action stage of change and ready to dedicate time achieving and maintaining a healthier weight. Brooke [name] is interested in becoming our patient and working on intensive lifestyle modifications including (but not limited to) diet and exercise for weight loss.  Brooke [name]'s habits were reviewed today and are as follows: Her family eats meals together, she thinks her family will eat healthier with her, her desired weight loss is 17 pounds, she has been heavy most of her life, she started gaining weight at age 44, her heaviest weight ever was 260 pounds, she craves sweets, crunchy foods, and meats, she snacks frequently in the evenings, she has problems with excessive hunger, she frequently eats larger portions than normal and she struggles with emotional eating.  Depression Screen [name]'s Food and Mood (modified PHQ-9) score was 10.  Depression screen [name] 2/9 07/30/2020  Decreased Interest 1  Down, Depressed, Hopeless 3  PHQ - 2 Score 4  Altered sleeping 0  Tired, decreased energy 1  Change in appetite 3  Feeling bad or failure about yourself  1  Trouble concentrating 1  Moving slowly or fidgety/restless 0  Suicidal thoughts 0  PHQ-9 Score 10  Difficult doing work/chores Somewhat difficult   Assessment/Plan:   1. Other fatigue Melisha admits to daytime somnolence and reports waking up still tired. Patent has a history of  symptoms of daytime fatigue, morning fatigue, morning headache and snoring. Brooke [name] generally gets 6-8 hours of sleep per night, and states that she has poor quality sleep. Snoring is present. Apneic episodes are present. Epworth Sleepiness Score is 7.  Victorian does not feel that her weight is causing her energy to be lower than it should be. Fatigue may be related to obesity, depression or many other causes. Labs will be ordered, and in the meanwhile, Brooke [name] will focus on self care including making healthy food choices, increasing physical activity and focusing on stress reduction.  - EKG 12-Lead - Anemia panel - CBC with Differential/Platelet - Comprehensive metabolic panel - Hemoglobin A1c - Insulin, random - Lipid panel - VITAMIN D 25 Hydroxy (Vit-D Deficiency, Fractures) - TSH - T4, free - Magnesium - PTH, intact and calcium  2. SOB (shortness of breath) on exertion Brooke [name] notes increasing shortness of breath with exercising and seems to be worsening over time with weight gain. She notes getting out of breath sooner with activity than she used to. This has gotten worse recently. Brooke [name] denies shortness of breath at rest or orthopnea.  Brooke [name] does feel that she gets out of breath more easily that she used to when she exercises. Brooke [name]'s shortness of breath appears to be obesity related and exercise induced. She has agreed to work on weight loss and gradually increase exercise to treat her exercise induced shortness of breath. Will continue to monitor closely.  - Anemia panel - CBC with Differential/Platelet - Comprehensive metabolic panel - Hemoglobin A1c - Insulin, random - Lipid panel - VITAMIN D 25 Hydroxy (  Vit-D Deficiency, Fractures) - TSH - T4, free - Magnesium - PTH, intact and calcium  3. PCOS (polycystic ovarian syndrome) Not at goal. She will continue to focus on protein-rich, low simple carbohydrate foods. We reviewed the importance of hydration, regular  exercise for stress reduction, and restorative sleep. Medication: None.  Plan:  Check labs today.  Counseling . PCOS is a leading cause of menstrual irregularities and infertility. It is also associated with obesity, hirsutism (excessive hair growth on the face, chest, or back), and cardiovascular risk factors such as high cholesterol and insulin resistance. . Insulin resistance appears to play a central role.  . Women with PCOS have been shown to have impaired appetite-regulating hormones. . Metformin is one medication that can improve metabolic parameters.  . Women with polycystic ovary syndrome (PCOS) have an increased risk for cardiovascular disease (CVD) - European Journal of Preventive Cardiology.  - TSH - T4, free - PTH, intact and calcium  4. 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    5. Malnutrition, unspecified type [facility]) Brooke [name] has risk of malnutrition due to her history of bariatric surgery.  Plan:  Will check labs and monitor.  - Anemia panel - VITAMIN D 25 Hydroxy (Vit-D Deficiency, Fractures) - PTH, intact and calcium  6. Fasting hyperglycemia Will check A1c and insulin today, as per below.  - Hemoglobin A1c - Insulin, random  7. 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  She is at risk for malnutrition due to her previous bariatric surgery.   Counseling  You may need to eat 3 meals and 2 snacks, or 5 small meals each day in order to reach your protein and calorie goals.   Allow at least 15 minutes for each meal so that you can eat mindfully. Listen to your body so that you do not overeat. For most people, your sleeve or pouch will comfortably hold 4-6 ounces.  Eat foods from all food groups. This includes fruits and vegetables, grains, dairy, and meat and other proteins.  Include a protein-rich food at every meal and snack, and eat the protein food first.    You should be taking a Bariatric Multivitamin as well as calcium.   - Anemia panel - Lipid panel - VITAMIN D 25 Hydroxy (Vit-D Deficiency, Fractures) - PTH, intact and calcium  8. History of pregnancy complication Brooke [name] has history of pregnancy complications in the past.  She is open to conception in the future.  9. Anxiety and depression with emotional eating Not at goal. Medication: None.  Plan:  Behavior modification techniques were discussed today to help deal with emotional/non-hunger eating behaviors.  10. At risk for activity intolerance Chandni was given approximately 8 minutes of counseling today regarding her increased risk for exercise intolerance.  We discussed patient's specific personal and medical issues that raise our concern.  She was advised of strategies to prevent injury and ways to improve her cardiopulmonary fitness levels slowly over time.  We additionally discussed various fitness trackers and smart phone apps to help motivate the patient to stay on track.   11. Class 1 obesity with serious comorbidity and body mass index (BMI) of 30.0 to 30.9 in adult, unspecified obesity type  Kinzey is currently in the action stage of change and her goal is to continue with weight loss efforts. I recommend Christyana begin the structured treatment plan as follows:  She has agreed to the Category 2 Plan.  Exercise  goals: No exercise has been prescribed at this time.   Behavioral modification strategies: increasing lean protein intake and increasing water intake.  She was informed of the importance of frequent follow-up visits to maximize her success with intensive lifestyle modifications for her multiple health conditions. She was informed we would discuss her lab results at her next visit unless there is a critical issue that needs to be addressed sooner. Aleiyah agreed to keep her next visit at the agreed upon time to discuss these results.  Objective:   Blood pressure  103/67, pulse 61, temperature 98.1 F (36.7 C), temperature source Oral, height 5\' 4"  (1.626 m), weight 166 lb (75.3 kg), last menstrual period 07/17/2020, SpO2 96 %, unknown if currently breastfeeding. Body mass index is 28.49 kg/m.  EKG: Normal sinus rhythm, rate 64 bpm.  Indirect Calorimeter completed today shows a VO2 of 245 and a REE of 1708.  Her calculated basal metabolic rate is [id number] thus her basal metabolic rate is better than expected.  General: Cooperative, alert, well developed, in no acute distress. HEENT: Conjunctivae and lids unremarkable. Cardiovascular: Regular rhythm.  Lungs: Normal work of breathing. Neurologic: No focal deficits.   Lab Results  Component Value Date   CREATININE 0.77 07/30/2020   BUN 13 07/30/2020   NA 140 07/30/2020   K 4.5 07/30/2020   CL 103 07/30/2020   CO2 24 07/30/2020   Lab Results  Component Value Date   ALT 27 07/30/2020   AST 17 07/30/2020   ALKPHOS 87 07/30/2020   BILITOT 0.5 07/30/2020   Lab Results  Component Value Date   HGBA1C 5.0 07/30/2020   Lab Results  Component Value Date   INSULIN 5.4 07/30/2020   Lab Results  Component Value Date   TSH 1.340 07/30/2020   Lab Results  Component Value Date   CHOL 151 07/30/2020   HDL 61 07/30/2020   LDLCALC 74 07/30/2020   TRIG 83 07/30/2020   CHOLHDL 2.5 07/30/2020   Lab Results  Component Value Date   WBC 5.2 07/30/2020   HGB 13.9 07/30/2020   HCT 41.7 07/30/2020   MCV 96 07/30/2020   PLT 263 07/30/2020   Lab Results  Component Value Date   IRON 91 07/30/2020   TIBC 269 07/30/2020   FERRITIN 112 07/30/2020   Attestation Statements:   This is the patient's first visit at [facility]. The patient's NEW PATIENT PACKET was reviewed at length. Included in the packet: current and past health history, medications, allergies, ROS, gynecologic history (women only), surgical history, family history, social history, weight history, weight loss surgery  history (for those that have had weight loss surgery), nutritional evaluation, mood and food questionnaire, PHQ9, Epworth questionnaire, sleep habits questionnaire, patient life and health improvement goals questionnaire. These will all be scanned into the patient's chart under media.   During the visit, I independently reviewed the patient's EKG, bioimpedance scale results, and indirect calorimeter results. I used this information to tailor a meal plan for the patient that will help her to lose weight and will improve her obesity-related conditions going forward. I performed a medically necessary appropriate examination and/or evaluation. I discussed the assessment and treatment plan with the patient. The patient was provided an opportunity to ask questions and all were answered. The patient agreed with the plan and demonstrated an understanding of the instructions. Labs were ordered at this visit and will be reviewed at the next visit unless more critical results need to be addressed immediately.  Clinical information was updated and documented in the EMR.   I, Insurance claims handler, CMA, am acting as transcriptionist for Helane Rima, DO  I have reviewed the above documentation for accuracy and completeness, and I agree with the above. Helane Rima, DO

## 2020-08-12 ENCOUNTER — Encounter (INDEPENDENT_AMBULATORY_CARE_PROVIDER_SITE_OTHER): Payer: Self-pay | Admitting: Family Medicine

## 2020-08-13 ENCOUNTER — Other Ambulatory Visit: Payer: Self-pay

## 2020-08-13 ENCOUNTER — Encounter (INDEPENDENT_AMBULATORY_CARE_PROVIDER_SITE_OTHER): Payer: Self-pay | Admitting: Family Medicine

## 2020-08-13 ENCOUNTER — Ambulatory Visit (INDEPENDENT_AMBULATORY_CARE_PROVIDER_SITE_OTHER): Payer: 59 | Admitting: Family Medicine

## 2020-08-13 VITALS — BP 109/72 | HR 80 | Temp 98.5°F | Ht 64.0 in | Wt 166.0 lb

## 2020-08-13 DIAGNOSIS — K279 Peptic ulcer, site unspecified, unspecified as acute or chronic, without hemorrhage or perforation: Secondary | ICD-10-CM | POA: Diagnosis not present

## 2020-08-13 DIAGNOSIS — R238 Other skin changes: Secondary | ICD-10-CM | POA: Diagnosis not present

## 2020-08-13 DIAGNOSIS — Z9189 Other specified personal risk factors, not elsewhere classified: Secondary | ICD-10-CM | POA: Diagnosis not present

## 2020-08-13 DIAGNOSIS — E669 Obesity, unspecified: Secondary | ICD-10-CM

## 2020-08-13 DIAGNOSIS — Z9884 Bariatric surgery status: Secondary | ICD-10-CM

## 2020-08-13 DIAGNOSIS — E282 Polycystic ovarian syndrome: Secondary | ICD-10-CM | POA: Insufficient documentation

## 2020-08-13 DIAGNOSIS — G40B09 Juvenile myoclonic epilepsy, not intractable, without status epilepticus: Secondary | ICD-10-CM

## 2020-08-13 DIAGNOSIS — N644 Mastodynia: Secondary | ICD-10-CM

## 2020-08-13 DIAGNOSIS — Z6830 Body mass index (BMI) 30.0-30.9, adult: Secondary | ICD-10-CM

## 2020-08-13 DIAGNOSIS — R233 Spontaneous ecchymoses: Secondary | ICD-10-CM

## 2020-08-13 MED ORDER — PANTOPRAZOLE SODIUM 40 MG PO TBEC: 40.0000 mg | DELAYED_RELEASE_TABLET | Freq: Every day | ORAL | 3 refills | Status: DC

## 2020-08-13 MED ORDER — PANTOPRAZOLE SODIUM 40 MG PO TBEC: 40.0000 mg | DELAYED_RELEASE_TABLET | Freq: Every day | ORAL | 0 refills | Status: DC

## 2020-08-14 LAB — PROTIME-INR
INR: 1 (ref 0.9–1.2)
Prothrombin Time: 10.3 s (ref 9.1–12.0)

## 2020-08-14 LAB — BETA HCG QUANT (REF LAB): hCG Quant: <1 m[IU]/mL

## 2020-08-19 NOTE — Progress Notes (Signed)
Chief Complaint:   OBESITY Brooke [name] is here to discuss her progress with her obesity treatment plan along with follow-up of her obesity related diagnoses.   Today's visit was #: 2 Starting weight: 166 lbs Starting date: 07/30/2020 Today's weight: 166 lbs Today's date: 08/13/2020 Total lbs lost to date: 0 Body mass index is 28.49 kg/m.    Interim History: Brooke [name] has started taking bariatric multivitamins, magnesium, tums. She reports less lightheadedness. Her hunger happens quickly. She is getting her fluids in. Her protein amount is greater than 90 grams some days. She is having 2 protein shakes, 1 yogurt, either a lunchable or salmon (between 2-3) daily.  Current Meal Plan: the Category 2 Plan for 30-40% of the time.  Current Exercise Plan: None at this time.  Assessment/Plan:   1. PUD (peptic ulcer disease) Controlled on Protonix 40 mg daily.  Plan: Refill Protonix, as per below.  - Refill pantoprazole (PROTONIX) 40 MG tablet; Take 1 tablet (40 mg total) by mouth at bedtime.  Dispense: 90 tablet; Refill: 3  2. PCOS (polycystic ovarian syndrome) Not at goal. She will continue to focus on protein-rich, low simple carbohydrate foods. We reviewed the importance of hydration, regular exercise for stress reduction, and restorative sleep. Medication: None.  Counseling . PCOS is a leading cause of menstrual irregularities and infertility. It is also associated with obesity, hirsutism (excessive hair growth on the face, chest, or back), and cardiovascular risk factors such as high cholesterol and insulin resistance. . Insulin resistance appears to play a central role.  . Women with PCOS have been shown to have impaired appetite-regulating hormones. . Metformin is one medication that can improve metabolic parameters.  . Women with polycystic ovary syndrome (PCOS) have an increased risk for cardiovascular disease (CVD) - European Journal of Preventive Cardiology.  3.  Nonintractable juvenile myoclonic epilepsy without status epilepticus (HCC) We will continue to monitor symptoms as they relate to her weight loss journey.  4. Easy bruisability Reports to bruising easily.  Plan: Will check labs today.  - INR/PT  5. Breast tenderness Will check labs today.  - Beta hCG quant (ref lab)  6. 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    7. At risk for malnutrition Brooke [name] was given extensive malnutrition prevention education and counseling today of more than 10 minutes.   She is at risk for malnutrition due to her previous bariatric surgery. Reviewed labs and they are reassuring.  Counseling  You may need to eat 3 meals and 2 snacks, or 5 small meals each day in order to reach your protein and calorie goals.   Allow at least 15 minutes for each meal so that you can eat mindfully. Listen to your body so that you do not overeat. For most people, your sleeve or pouch will comfortably hold 4-6 ounces.  Eat foods from all food groups. This includes fruits and vegetables, grains, dairy, and meat and other proteins.  Include a protein-rich food at every meal and snack, and eat the protein food first.   You should be taking a Bariatric Multivitamin as well as calcium.   8. Obesity, current BMI 28.5  Course: Brooke [name] is currently in the action stage of change. As such, her goal is to continue with weight loss efforts.   Nutrition goals: She has agreed to keeping a food journal and adhering to recommended goals of 1200 calories and 90 grams of protein.   Exercise goals: As is.  Behavioral modification  strategies: increasing lean protein intake, increasing water intake and no skipping meals.  Brooke [name] has agreed to follow-up with our clinic in 4 weeks. She was informed of the importance of frequent follow-up visits to maximize her success with intensive lifestyle modifications for her multiple health conditions.   Brooke [name] was  informed we would discuss her lab results at her next visit unless there is a critical issue that needs to be addressed sooner. Brooke [name] agreed to keep her next visit at the agreed upon time to discuss these results.  Objective:   Blood pressure 109/72, pulse 80, temperature 98.5 F (36.9 C), temperature source Oral, height 5\' 4"  (1.626 m), weight 166 lb (75.3 kg), last menstrual period 07/17/2020, SpO2 98 %, unknown if currently breastfeeding. Body mass index is 28.49 kg/m.  General: Cooperative, alert, well developed, in no acute distress. HEENT: Conjunctivae and lids unremarkable. Cardiovascular: Regular rhythm.  Lungs: Normal work of breathing. Neurologic: No focal deficits.   Lab Results  Component Value Date   CREATININE 0.77 07/30/2020   BUN 13 07/30/2020   NA 140 07/30/2020   K 4.5 07/30/2020   CL 103 07/30/2020   CO2 24 07/30/2020   Lab Results  Component Value Date   ALT 27 07/30/2020   AST 17 07/30/2020   ALKPHOS 87 07/30/2020   BILITOT 0.5 07/30/2020   Lab Results  Component Value Date   HGBA1C 5.0 07/30/2020   Lab Results  Component Value Date   INSULIN 5.4 07/30/2020   Lab Results  Component Value Date   TSH 1.340 07/30/2020   Lab Results  Component Value Date   CHOL 151 07/30/2020   HDL 61 07/30/2020   LDLCALC 74 07/30/2020   TRIG 83 07/30/2020   CHOLHDL 2.5 07/30/2020   Lab Results  Component Value Date   WBC 5.2 07/30/2020   HGB 13.9 07/30/2020   HCT 41.7 07/30/2020   MCV 96 07/30/2020   PLT 263 07/30/2020   Lab Results  Component Value Date   IRON 91 07/30/2020   TIBC 269 07/30/2020   FERRITIN 112 07/30/2020   Attestation Statements:   Reviewed by clinician on day of visit: allergies, medications, problem list, medical history, surgical history, family history, social history, and previous encounter notes.  08/01/2020 Friedenbach, CMA, am acting as 10-[birth year]-1972 for Energy manager, DO.  I have reviewed the above documentation for  accuracy and completeness, and I agree with the above. [employer], DO

## 2020-09-08 ENCOUNTER — Encounter: Payer: Self-pay | Admitting: Neurology

## 2020-09-08 ENCOUNTER — Ambulatory Visit (INDEPENDENT_AMBULATORY_CARE_PROVIDER_SITE_OTHER): Payer: 59 | Admitting: Neurology

## 2020-09-08 ENCOUNTER — Other Ambulatory Visit: Payer: Self-pay

## 2020-09-08 VITALS — BP 115/78 | HR 69 | Ht 64.0 in | Wt 165.0 lb

## 2020-09-08 DIAGNOSIS — G40B09 Juvenile myoclonic epilepsy, not intractable, without status epilepticus: Secondary | ICD-10-CM | POA: Diagnosis not present

## 2020-09-08 DIAGNOSIS — F518 Other sleep disorders not due to a substance or known physiological condition: Secondary | ICD-10-CM | POA: Diagnosis not present

## 2020-09-08 DIAGNOSIS — F419 Anxiety disorder, unspecified: Secondary | ICD-10-CM

## 2020-09-08 DIAGNOSIS — Z9884 Bariatric surgery status: Secondary | ICD-10-CM | POA: Diagnosis not present

## 2020-09-08 MED ORDER — LORAZEPAM 1 MG PO TABS: 1.0000 mg | ORAL_TABLET | Freq: Every day | ORAL | 5 refills | Status: DC

## 2020-09-08 NOTE — Progress Notes (Signed)
[facility]  PATIENT: Brooke [name] DOB: Dec 21, 1985  REFERRING DOCTOR OR PCP:  Kandyce RudMarcus Babaoff SOURCE: Patient, notes from recent admission including consult notes, imaging and laboratory reports.   _________________________________   HISTORICAL  CHIEF COMPLAINT:  Chief Complaint  Patient presents with  . Follow-up    Rm 14, alone, reports no SX, pt has noticed jerking when falling asleep that will wake her up, onset few weeks, noticed more when ovulating or in her cycle. Reports being tired more, having anxiety/panic attacks , not taking any anxiety meds.     HISTORY OF PRESENT ILLNESS:  I had the pleasure of [facility] your patient, [facility]oke [name][facility] regarding her epilepsy.  Update 09/08/2020: She has not had any more GTC seizures.    She is on lamotrigine 100 mg po bid and Keppra 750 bid.    She has no more morning myoclonus since the lamotrigine was increased.    She had bariatric surgery (was 270 pounds - now 165lbs) and had low albumin when I last saw her.  She was referred to nutrition and her weight is stable and she is eating better.     She has some anxiety.   Lorazepam in the past had helped anxiety.     She has migraine headaches.   They have generally done well since her son is back in school.    She is now sleeping 9-10 hours because she gets sleepy at 7 pm and goes to bed at wakes up to get son   When I saw her at the last visit, she was pregnant.  She had an early miscarriage shortly after that visit..  Seizure history She was diagnosed with juvenile myoclonic epilepsy at age 35.   At that time, she had episodes of myoclonus, especially in the mornings.  She noted that going back to sleep would help.   She saw Dr. Sharene SkeansHickling and was placed on Depakote but gained weight and lost hair.  On a combination of lamotrigine and Depakote, she did better with decent tolerability and no  further hair loss.   At some point, she was switched to Topamax which helped the best (no myoclonus) but caused tingling and altered taste.    Myoclonus always did better if she slept better.  She had a flurry of myoclonic jerks associated with hr son's birth 6 years ago  She never had a definite absence seizure.   However, Two years ago, she had a possible absence event.   She had a conversation at work but does not recall several minutes.      Her first generalized tonic clonic seizure was 05/19/2020.   She was unable to sleep due to feeling very cold.  She decided to take a warm bath.  While getting the tub ready, she started convulsing.   The husband who was in different part of the house heard the noise and ran in and saw her convulsing for about 1 minute.    He got her on the bathroom floor.   He called 911 after she stopped seizing.   THis was about 1030 am. She went to the ED.   she was conversant after the seizure but she does not recall that period of time.  About noon, in the ED, she had a second GTC seizure.   She was started on Keppra and lamotrigine.   She does not recall the rest of that day and has spotty memory  of the next few days.   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     Studies: CT scan 05/19/2020 was normal.   Actual images were reviewed and I concur.  Labs showed low normal Mg and mildly low Calcium and albumin.    EEG 10/29/2014 showed 1-3 second bursts of generalized irregular 3-4 Hz spike and polyspike and wave discharges with frontal predominance.    REVIEW OF SYSTEMS: Constitutional: No fevers, chills, sweats, or change in appetite Eyes: No visual changes, double vision, eye pain Ear, nose and throat: No hearing loss, ear pain, nasal congestion, sore throat Cardiovascular: No chest pain, palpitations Respiratory: No shortness of breath at rest or with exertion.   No wheezes GastrointestinaI: No nausea, vomiting,  diarrhea, abdominal pain, fecal incontinence Genitourinary: No dysuria, urinary retention or frequency.  No nocturia. Musculoskeletal: No neck pain, back pain Integumentary: No rash, pruritus, skin lesions Neurological: as above Psychiatric: No depression at this time.  No anxiety Endocrine: No palpitations, diaphoresis, change in appetite, change in weigh or increased thirst Hematologic/Lymphatic: No anemia, purpura, petechiae. Allergic/Immunologic: No itchy/runny eyes, nasal congestion, recent allergic reactions, rashes  ALLERGIES: No Known Allergies  HOME MEDICATIONS:  Current Outpatient Medications:  .  acetaminophen (TYLENOL) 500 MG tablet, Take 500 mg by mouth every 6 (six) hours as needed., Disp: , Rfl:  .  lamoTRIgine (LAMICTAL) 100 MG tablet, Take 1 tablet (100 mg total) by mouth 2 (two) times daily., Disp: 60 tablet, Rfl: 11 .  levETIRAcetam (KEPPRA) 750 MG tablet, Take 1 tablet (750 mg total) by mouth 2 (two) times daily., Disp: 60 tablet, Rfl: 11 .  LORazepam (ATIVAN) 1 MG tablet, Take 1 tablet (1 mg total) by mouth at bedtime., Disp: 30 tablet, Rfl: 5 .  Magnesium 400 MG CAPS, Take 1 tablet by mouth daily in the afternoon., Disp: , Rfl:  .  Multiple Vitamin (MULTI VITAMIN PO), Take by mouth. Bariatric mutlti vitamin, Disp: , Rfl:  .  OVER THE COUNTER MEDICATION, BARIATRIC MVI  SUPPLEMENT PATCH, Disp: , Rfl:  .  pantoprazole (PROTONIX) 40 MG tablet, Take 1 tablet (40 mg total) by mouth at bedtime., Disp: 90 tablet, Rfl: 3  PAST MEDICAL HISTORY: Past Medical History:  Diagnosis Date  . Alopecia    wears hair topper -  . Anxiety   . Arthritis   . Back pain   . Depression   . Epilepsy (HCC)   . GAD (generalized anxiety disorder)   . GERD (gastroesophageal reflux disease)   . History of concussion age 75   per pt hit in head w/ softball , forehead area,  no loc had multiple stitches  . History of hypertension 2016   06-15-2020  per pt resolved after gastric bypass  09/ 2021  . History of obstructive sleep apnea    per pt tested prior to gatric bypass w/ severe osa wear apap,  retested 3 weeks after gastric bypass (s/p 02-03-2020) told no longer had osa  . Hypertension   . Infertility, female   . Joint pain   . MDD (major depressive disorder)   . Migraines   . Nonintractable juvenile myoclonic epilepsy without status epilepticus [facility]) age 67   (06-15-2020  per pt no seizure since 05-19-2020)   neurologist--- dr Epimenio Foot--- pt previously seen by dr hickling/ dr Modesto Charon,  pt was referred to dr Brookelynn Hamor from ED visit / overnight stay for pt's first two generalized tonic clonic seizure's ,  pt had stated unable to sleep (second one was witnessed in ED);  per dr Steffen Hase note in epic 06-09-2020 pt is back to baseline with intermittant myoclinic jerks with medication adjustement, normal head CT 05-19-2020  . Nutritional deficiency   . PCOS (polycystic ovarian syndrome)   . Prediabetes   . Sleep apnea     PAST SURGICAL HISTORY: Past Surgical History:  Procedure Laterality Date  . CERVICAL CERCLAGE N/A 06/26/2014   Procedure: CERCLAGE CERVICAL;  Surgeon: Lenoard Aden, MD;  Location: [facility];  Service: Gynecology;  Laterality: N/A;  . DILATATION & CURETTAGE/HYSTEROSCOPY WITH MYOSURE N/A 02/16/2017   Procedure: DILATATION & CURETTAGE/HYSTEROSCOPY WITH MYOSURE RESECTION OF ENDOMETRIAL POLYP;  Surgeon: Olivia Mackie, MD;  Location: [facility];  Service: Gynecology;  Laterality: N/A;  . DILATION AND EVACUATION N/A 06/17/2020   Procedure: DILATATION AND EVACUATION;  Surgeon: Olivia Mackie, MD;  Location: [facility];  Service: Gynecology;  Laterality: N/A;  . ROUX-EN-Y GASTRIC BYPASS  02-03-2020  @ [facility]   LAPAROSCOPIC  . TONSILLECTOMY  CHILD  . TYMPANOSTOMY TUBE PLACEMENT Bilateral child  . WISDOM TOOTH EXTRACTION  age 73    FAMILY HISTORY: Family History  Problem Relation Age of Onset  . Diabetes Mother   . Hypertension Mother   . Heart disease Mother    . Obesity Mother   . Hypertension Father   . Obesity Father   . Alcohol abuse Maternal Grandmother   . Hearing loss Paternal Grandfather     SOCIAL HISTORY:  Social History   Socioeconomic History  . Marital status: Married    Spouse name: Not on file  . Number of children: 1  . Years of education: Not on file  . Highest education level: Not on file  Occupational History  . Occupation: Stay at home mother  Tobacco Use  . Smoking status: Never Smoker  . Smokeless tobacco: Never Used  Vaping Use  . Vaping Use: Never used  Substance and Sexual Activity  . Alcohol use: No  . Drug use: Never  . Sexual activity: Yes    Partners: Male    Birth control/protection: None  Other Topics Concern  . Not on file  Social History Narrative   Married: no kids   Regular exercise: not at this time   Caffeine use: no   Lives with husband   Social Determinants of Corporate investment banker Strain: Not on file  Food Insecurity: Not on file  Transportation Needs: Not on file  Physical Activity: Not on file  Stress: Not on file  Social Connections: Not on file  Intimate Partner Violence: Not on file     PHYSICAL EXAM  Vitals:   09/08/20 1057  BP: 115/78  Pulse: 69  Weight: 165 lb (74.8 kg)  Height: 5\' 4"  (1.626 m)    Body mass index is 28.32 kg/m.   General: The patient is well-developed and well-nourished and in no acute distress  HEENT:  Head is [place]/AT.  Sclera are anicteric.    Skin: Extremities are without rash or  edema.  Neurologic Exam  Mental status: The patient is alert and oriented x 3 at the time of the examination. The patient has apparent normal recent and remote memory, with an apparently normal attention span and concentration ability.   Speech is normal.  Cranial nerves: Extraocular movements are full.  Facial strength and sensation was normal. Motor:  Muscle bulk is normal.   Tone is normal. Strength is  5 /  5 in all 4 extremities.   Sensory:  Sensory testing is intact to pinprick, soft touch and vibration sensation in all 4 extremities.  Coordination: Cerebellar testing reveals good finger-nose-finger and heel-to-shin bilaterally.  Gait and station: Station is normal.   Gait and tandem gait are normal.  Romberg is negative.  Reflexes: Deep tendon reflexes are symmetric and normal bilaterally.   Plantar responses are flexor.    DIAGNOSTIC DATA (LABS, IMAGING, TESTING) - I reviewed patient records, labs, notes, testing and imaging myself where available.  Lab Results  Component Value Date   WBC 5.2 07/30/2020   HGB 13.9 07/30/2020   HCT 41.7 07/30/2020   MCV 96 07/30/2020   PLT 263 07/30/2020      Component Value Date/Time   NA 140 07/30/2020 0956   K 4.5 07/30/2020 0956   CL 103 07/30/2020 0956   CO2 24 07/30/2020 0956   GLUCOSE 86 07/30/2020 0956   GLUCOSE 80 05/20/2020 0435   BUN 13 07/30/2020 0956   CREATININE 0.77 07/30/2020 0956   CALCIUM 9.8 07/30/2020 0956   PROT 6.8 07/30/2020 0956   ALBUMIN 4.8 07/30/2020 0956   AST 17 07/30/2020 0956   ALT 27 07/30/2020 0956   ALKPHOS 87 07/30/2020 0956   BILITOT 0.5 07/30/2020 0956   GFRNONAA >60 05/20/2020 0435   GFRAA >60 09/26/2014 1234    Lab Results  Component Value Date   TSH 1.340 07/30/2020       ASSESSMENT AND PLAN  Nonintractable juvenile myoclonic epilepsy without status epilepticus (HCC) - Plan: Lamotrigine level  Hypnic jerks  Bariatric surgery status  Anxiety   1.  Continue lamotrigine.  Check level. 2.  Lorazepam 1 mg nightly for anxiety and insomnia. 3.  Return in 6 months or sooner if there are new or worsening neurologic symptoms.  Vedant Shehadeh A. Epimenio Foot, MD, [facility] 09/08/2020, 4:52 PM Certified in Neurology, Clinical Neurophysiology, Sleep Medicine and Neuroimaging  [facility] [address] [place], [place] [address] [phone]

## 2020-09-09 LAB — LAMOTRIGINE LEVEL: Lamotrigine Lvl: 2.2 ug/mL (ref 2.0–20.0)

## 2020-09-14 LAB — OB RESULTS CONSOLE ANTIBODY SCREEN: Antibody Screen: NEGATIVE

## 2020-09-14 LAB — OB RESULTS CONSOLE ABO/RH: RH Type: POSITIVE

## 2020-09-21 ENCOUNTER — Ambulatory Visit (INDEPENDENT_AMBULATORY_CARE_PROVIDER_SITE_OTHER): Payer: 59 | Admitting: Family Medicine

## 2020-09-21 ENCOUNTER — Other Ambulatory Visit: Payer: Self-pay

## 2020-09-21 ENCOUNTER — Encounter (INDEPENDENT_AMBULATORY_CARE_PROVIDER_SITE_OTHER): Payer: Self-pay | Admitting: Family Medicine

## 2020-09-21 VITALS — BP 106/68 | HR 63 | Temp 98.1°F | Ht 64.0 in | Wt 160.0 lb

## 2020-09-21 DIAGNOSIS — N912 Amenorrhea, unspecified: Secondary | ICD-10-CM | POA: Diagnosis not present

## 2020-09-21 DIAGNOSIS — Z6830 Body mass index (BMI) 30.0-30.9, adult: Secondary | ICD-10-CM

## 2020-09-21 DIAGNOSIS — E282 Polycystic ovarian syndrome: Secondary | ICD-10-CM

## 2020-09-21 DIAGNOSIS — E669 Obesity, unspecified: Secondary | ICD-10-CM

## 2020-09-21 DIAGNOSIS — Z9884 Bariatric surgery status: Secondary | ICD-10-CM

## 2020-09-22 LAB — BETA HCG QUANT (REF LAB): hCG Quant: 1603 m[IU]/mL

## 2020-09-23 ENCOUNTER — Other Ambulatory Visit: Payer: Self-pay | Admitting: *Deleted

## 2020-09-23 MED ORDER — LAMOTRIGINE 150 MG PO TABS: 150.0000 mg | ORAL_TABLET | Freq: Two times a day (BID) | ORAL | 11 refills | Status: DC

## 2020-09-28 NOTE — Progress Notes (Signed)
Chief Complaint:   OBESITY Brooke [name] is here to discuss her progress with her obesity treatment plan along with follow-up of her obesity related diagnoses.   Today's visit was #: 3 Starting weight: 166 lbs Starting date: 07/30/2020 Today's weight: 160 lbs Today's date: 09/21/2020 Weight change since last visit: 6 lbs Total lbs lost to date: 6 lbs Body mass index is 27.46 kg/m.  Total weight loss percentage to date: -3.61%  Interim History:  + Pregnancy. Her last LMP was on 4/12.  Neurology discontinued Keppra and changed to Lamictal. Current Meal Plan: keeping a food journal and adhering to recommended goals of 1200 calories and 90 grams of protein for 100% of the time.  Current Exercise Plan: Cardio for 20-30 minutes 3-4 times per week.  Assessment/Plan:   Orders Placed This Encounter  Procedures  . Beta hCG quant (ref lab)   Medications Discontinued During This Encounter  Medication Reason  . levETIRAcetam (KEPPRA) 750 MG tablet   . LORazepam (ATIVAN) 1 MG tablet    1. PCOS (polycystic ovarian syndrome) She will continue to focus on protein-rich, low simple carbohydrate foods. We reviewed the importance of hydration, regular exercise for stress reduction, and restorative sleep. Medication: None.  2. History of Roux-en-Y gastric bypass Brooke [name] had gastric bypass surgery in 2021.  She is at risk for malnutrition due to her previous bariatric surgery.   Counseling  You may need to eat 3 meals and 2 snacks, or 5 small meals each day in order to reach your protein and calorie goals.   Allow at least 15 minutes for each meal so that you can eat mindfully. Listen to your body so that you do not overeat. For most people, your sleeve or pouch will comfortably hold 4-6 ounces.  Eat foods from all food groups. This includes fruits and vegetables, grains, dairy, and meat and other proteins.  Include a protein-rich food at every meal and snack, and eat the protein food first.    You should be taking a Bariatric Multivitamin as well as calcium.   3. Amenorrhea, with positive pregnancy test Last LMP 4/12. Plan:  Will check beta hCG today.    - Beta hCG quant (ref lab)  4. Obesity, current BMI 27.5  Course: Brooke [name] is currently in the action stage of change. As such, her goal is to maintain healthy caloric/macronutrient intake and minimize weight gain during pregnancy.  Nutrition goals: She has agreed to practicing portion control and making smarter food choices, such as increasing vegetables and decreasing simple carbohydrates.   Exercise goals: As is.  Behavioral modification strategies: increasing lean protein intake, decreasing simple carbohydrates and increasing vegetables.  Brooke [name] has agreed to follow-up with our clinic in 4 weeks. She was informed of the importance of frequent follow-up visits to maximize her success with intensive lifestyle modifications for her multiple health conditions.   Objective:   Blood pressure 106/68, pulse 63, temperature 98.1 F (36.7 C), temperature source Oral, height 5\' 4"  (1.626 m), weight 160 lb (72.6 kg), last menstrual period 08/18/2020, SpO2 98 %, unknown if currently breastfeeding. Body mass index is 27.46 kg/m.  General: Cooperative, alert, well developed, in no acute distress. HEENT: Conjunctivae and lids unremarkable. Cardiovascular: Regular rhythm.  Lungs: Normal work of breathing. Neurologic: No focal deficits.   Lab Results  Component Value Date   CREATININE 0.77 07/30/2020   BUN 13 07/30/2020   NA 140 07/30/2020   K 4.5 07/30/2020   CL 103 07/30/2020  CO2 24 07/30/2020   Lab Results  Component Value Date   ALT 27 07/30/2020   AST 17 07/30/2020   ALKPHOS 87 07/30/2020   BILITOT 0.5 07/30/2020   Lab Results  Component Value Date   HGBA1C 5.0 07/30/2020   Lab Results  Component Value Date   INSULIN 5.4 07/30/2020   Lab Results  Component Value Date   TSH 1.340 07/30/2020   Lab  Results  Component Value Date   CHOL 151 07/30/2020   HDL 61 07/30/2020   LDLCALC 74 07/30/2020   TRIG 83 07/30/2020   CHOLHDL 2.5 07/30/2020   Lab Results  Component Value Date   WBC 5.2 07/30/2020   HGB 13.9 07/30/2020   HCT 41.7 07/30/2020   MCV 96 07/30/2020   PLT 263 07/30/2020   Lab Results  Component Value Date   IRON 91 07/30/2020   TIBC 269 07/30/2020   FERRITIN 112 07/30/2020   Attestation Statements:   Reviewed by clinician on day of visit: allergies, medications, problem list, medical history, surgical history, family history, social history, and previous encounter notes.  I, Insurance claims handler, CMA, am acting as transcriptionist for Helane Rima, DO  I have reviewed the above documentation for accuracy and completeness, and I agree with the above. Helane Rima, DO

## 2020-10-16 ENCOUNTER — Inpatient Hospital Stay (HOSPITAL_COMMUNITY)
Admission: AD | Admit: 2020-10-16 | Discharge: 2020-10-17 | Disposition: A | Discharge status: Home / Self Care | Payer: 59 | Attending: Obstetrics and Gynecology | Admitting: Obstetrics and Gynecology

## 2020-10-16 ENCOUNTER — Other Ambulatory Visit: Payer: Self-pay

## 2020-10-16 DIAGNOSIS — Z3A08 8 weeks gestation of pregnancy: Secondary | ICD-10-CM | POA: Insufficient documentation

## 2020-10-16 DIAGNOSIS — O99331 Smoking (tobacco) complicating pregnancy, first trimester: Secondary | ICD-10-CM | POA: Insufficient documentation

## 2020-10-16 DIAGNOSIS — O4690 Antepartum hemorrhage, unspecified, unspecified trimester: Secondary | ICD-10-CM

## 2020-10-16 DIAGNOSIS — E282 Polycystic ovarian syndrome: Secondary | ICD-10-CM | POA: Insufficient documentation

## 2020-10-16 DIAGNOSIS — O99281 Endocrine, nutritional and metabolic diseases complicating pregnancy, first trimester: Secondary | ICD-10-CM | POA: Insufficient documentation

## 2020-10-16 DIAGNOSIS — O99351 Diseases of the nervous system complicating pregnancy, first trimester: Secondary | ICD-10-CM | POA: Insufficient documentation

## 2020-10-16 DIAGNOSIS — O99611 Diseases of the digestive system complicating pregnancy, first trimester: Secondary | ICD-10-CM | POA: Insufficient documentation

## 2020-10-16 DIAGNOSIS — G40909 Epilepsy, unspecified, not intractable, without status epilepticus: Secondary | ICD-10-CM | POA: Insufficient documentation

## 2020-10-16 DIAGNOSIS — O99841 Bariatric surgery status complicating pregnancy, first trimester: Secondary | ICD-10-CM | POA: Insufficient documentation

## 2020-10-16 DIAGNOSIS — O209 Hemorrhage in early pregnancy, unspecified: Secondary | ICD-10-CM | POA: Insufficient documentation

## 2020-10-16 DIAGNOSIS — Z79899 Other long term (current) drug therapy: Secondary | ICD-10-CM | POA: Insufficient documentation

## 2020-10-16 DIAGNOSIS — K219 Gastro-esophageal reflux disease without esophagitis: Secondary | ICD-10-CM | POA: Insufficient documentation

## 2020-10-17 ENCOUNTER — Inpatient Hospital Stay (HOSPITAL_COMMUNITY): Payer: 59

## 2020-10-17 ENCOUNTER — Encounter (HOSPITAL_COMMUNITY): Payer: Self-pay | Admitting: Obstetrics and Gynecology

## 2020-10-17 DIAGNOSIS — O2621 Pregnancy care for patient with recurrent pregnancy loss, first trimester: Secondary | ICD-10-CM | POA: Diagnosis not present

## 2020-10-17 DIAGNOSIS — E282 Polycystic ovarian syndrome: Secondary | ICD-10-CM | POA: Diagnosis not present

## 2020-10-17 DIAGNOSIS — O99611 Diseases of the digestive system complicating pregnancy, first trimester: Secondary | ICD-10-CM | POA: Diagnosis not present

## 2020-10-17 DIAGNOSIS — G40909 Epilepsy, unspecified, not intractable, without status epilepticus: Secondary | ICD-10-CM | POA: Diagnosis not present

## 2020-10-17 DIAGNOSIS — Z79899 Other long term (current) drug therapy: Secondary | ICD-10-CM | POA: Diagnosis not present

## 2020-10-17 DIAGNOSIS — O99841 Bariatric surgery status complicating pregnancy, first trimester: Secondary | ICD-10-CM | POA: Diagnosis not present

## 2020-10-17 DIAGNOSIS — O99331 Smoking (tobacco) complicating pregnancy, first trimester: Secondary | ICD-10-CM | POA: Diagnosis not present

## 2020-10-17 DIAGNOSIS — K219 Gastro-esophageal reflux disease without esophagitis: Secondary | ICD-10-CM | POA: Diagnosis not present

## 2020-10-17 DIAGNOSIS — Z3A08 8 weeks gestation of pregnancy: Secondary | ICD-10-CM

## 2020-10-17 DIAGNOSIS — O209 Hemorrhage in early pregnancy, unspecified: Secondary | ICD-10-CM

## 2020-10-17 DIAGNOSIS — O99351 Diseases of the nervous system complicating pregnancy, first trimester: Secondary | ICD-10-CM | POA: Diagnosis not present

## 2020-10-17 DIAGNOSIS — O99281 Endocrine, nutritional and metabolic diseases complicating pregnancy, first trimester: Secondary | ICD-10-CM | POA: Diagnosis not present

## 2020-10-17 LAB — HCG, QUANTITATIVE, PREGNANCY: hCG, Beta Chain, Quant, S: 210343 m[IU]/mL — ABNORMAL HIGH (ref <5)

## 2020-10-17 LAB — CBC
HCT: 39.5 % (ref 36.0–46.0)
Hemoglobin: 13.4 g/dL (ref 12.0–15.0)
MCH: 31.3 pg (ref 26.0–34.0)
MCHC: 33.9 g/dL (ref 30.0–36.0)
MCV: 92.3 fL (ref 80.0–100.0)
Platelets: 249 10*3/uL (ref 150–400)
RBC: 4.28 MIL/uL (ref 3.87–5.11)
RDW: 11.9 % (ref 11.5–15.5)
WBC: 10.7 10*3/uL — ABNORMAL HIGH (ref 4.0–10.5)
nRBC: 0 % (ref 0.0–0.2)

## 2020-10-17 LAB — WET PREP, GENITAL
Clue Cells Wet Prep HPF POC: NONE SEEN
Sperm: NONE SEEN
Trich, Wet Prep: NONE SEEN
Yeast Wet Prep HPF POC: NONE SEEN

## 2020-10-17 NOTE — Progress Notes (Signed)
Gerrit Heck CNM in earlier to discuss test results and d/c plan. WRitten and verbal d/c instructions given and understanding voiced

## 2020-10-17 NOTE — MAU Provider Note (Signed)
History     CSN: [id number]  Arrival date and time: 10/16/20 2345   Event Date/Time   First Provider Initiated Contact with Patient 10/17/20 0129      Chief Complaint  Patient presents with   Vaginal Bleeding   Brooke [name] is a 35 y.o. [id number] at 8.4 weeks by Definite LMP of August 18, 2020 who receives care at [facility].  She presents today for Vaginal Bleeding. She states she started having "a lot of bleeding" around 2230.  She reports she also had cramping and passing of clots.  She states she had one clot was the "size of my fist."  She states she initially thought the cramping was due to GI issues as her h/o bariatric surgery causes some bouts of diarrhea.  Patient states that before her vaginal bleeding, she was experiencing diarrhea and rectal bleeding.  She reports that her cramping has improved and it did not last longer than 20-30 minutes.  She reports that she had an [place] on Wednesday with FHR noted.  She also reports a history of incompetent cervix and molar pregnancy.  Patient reports sexual activity last night without pain, discomfort, or bleeding.      OB History     Gravida  3   Para  1   Term      Preterm  1   AB  1   Living  1      SAB      IAB      Ectopic      Multiple  0   Live Births  1           Past Medical History:  Diagnosis Date   Alopecia    wears hair topper -   Anxiety    Arthritis    Back pain    Depression    Epilepsy (HCC)    GAD (generalized anxiety disorder)    GERD (gastroesophageal reflux disease)    History of concussion age 67   per pt hit in head w/ softball , forehead area,  no loc had multiple stitches   History of hypertension 2016   06-15-2020  per pt resolved after gastric bypass 09/ 2021   History of obstructive sleep apnea    per pt tested prior to gatric bypass w/ severe osa wear apap,  retested 3 weeks after gastric bypass (s/p 02-03-2020) told no longer had osa   Hypertension     Infertility, female    Joint pain    MDD (major depressive disorder)    Migraines    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 86   (06-15-2020  per pt no seizure since 05-19-2020)   neurologist--- dr Epimenio Foot--- pt previously seen by dr hickling/ dr Modesto Charon,  pt was referred to dr sater from ED visit / overnight stay for pt's first two generalized tonic clonic seizure's , pt had stated unable to sleep (second one was witnessed in ED);  per dr sater note in epic 06-09-2020 pt is back to baseline with intermittant myoclinic jerks with medication adjustement, normal head CT 05-19-2020   Nutritional deficiency    PCOS (polycystic ovarian syndrome)    Prediabetes    Sleep apnea     Past Surgical History:  Procedure Laterality Date   CERVICAL CERCLAGE N/A 06/26/2014   Procedure: CERCLAGE CERVICAL;  Surgeon: Lenoard Aden, MD;  Location: [facility];  Service: Gynecology;  Laterality: N/A;   DILATATION & CURETTAGE/HYSTEROSCOPY WITH MYOSURE  N/A 02/16/2017   Procedure: DILATATION & CURETTAGE/HYSTEROSCOPY WITH MYOSURE RESECTION OF ENDOMETRIAL POLYP;  Surgeon: Olivia Mackieaavon, Richard, MD;  Location: [facility];  Service: Gynecology;  Laterality: N/A;   DILATION AND EVACUATION N/A 06/17/2020   Procedure: DILATATION AND EVACUATION;  Surgeon: Olivia Mackieaavon, Richard, MD;  [facility];  Service: Gynecology;  Laterality: N/A;   ROUX-EN-Y GASTRIC BYPASS  02-03-2020  @ [facility]   LAPAROSCOPIC   TONSILLECTOMY  CHILD   TYMPANOSTOMY TUBE PLACEMENT Bilateral child   WISDOM TOOTH EXTRACTION  age 35    Family History  Problem Relation Age of Onset   Diabetes Mother    Hypertension Mother    Heart disease Mother    Obesity Mother    Hypertension Father    Obesity Father    Alcohol abuse Maternal Grandmother    Hearing loss Paternal Grandfather     Social History   Tobacco Use   Smoking status: Never   Smokeless tobacco: Never  Vaping Use   Vaping Use: Never used   Substance Use Topics   Alcohol use: No   Drug use: Never    Allergies: No Known Allergies  Medications Prior to Admission  Medication Sig Dispense Refill Last Dose   calcium carbonate (TUMS - DOSED IN MG ELEMENTAL CALCIUM) 500 MG chewable tablet Chew 3 tablets by mouth daily.      lamoTRIgine (LAMICTAL) 150 MG tablet Take 1 tablet (150 mg total) by mouth 2 (two) times daily. 60 tablet 11 10/16/2020   Magnesium 400 MG CAPS Take 1 tablet by mouth daily in the afternoon.   10/16/2020   Multiple Vitamin (MULTI VITAMIN PO) Take by mouth. Bariatric mutlti vitamin   10/16/2020   OVER THE COUNTER MEDICATION BARIATRIC MVI  SUPPLEMENT PATCH   10/16/2020   pantoprazole (PROTONIX) 40 MG tablet Take 1 tablet (40 mg total) by mouth at bedtime. 90 tablet 3 10/16/2020   acetaminophen (TYLENOL) 500 MG tablet Take 500 mg by mouth every 6 (six) hours as needed.       Review of Systems  Constitutional:  Positive for chills. Negative for fever.  Gastrointestinal:  Positive for abdominal pain (Cramping-Resolved) and diarrhea. Negative for constipation, nausea and vomiting.  Genitourinary:  Positive for vaginal bleeding. Negative for difficulty urinating, dysuria and vaginal discharge.  Neurological:  Negative for dizziness, light-headedness and headaches.  Physical Exam   Blood pressure 110/69, pulse (!) 54, temperature 98.3 F (36.8 C), resp. rate 16, height 5\' 4"  (1.626 m), weight 72.6 kg, last menstrual period 08/18/2020, unknown if currently breastfeeding.  Physical Exam Vitals reviewed. Exam conducted with a chaperone present.  Constitutional:      Appearance: Normal appearance. She is not ill-appearing.  HENT:     Head: Normocephalic and atraumatic.  Eyes:     Conjunctiva/sclera: Conjunctivae normal.  Pulmonary:     Effort: Pulmonary effort is normal. No respiratory distress.  Abdominal:     Palpations: Abdomen is soft.     Tenderness: There is no abdominal tenderness.  Genitourinary:     Vagina: Bleeding present.     Uterus: Enlarged. Not tender.      Comments: Speculum Exam: -Normal External Genitalia: Non tender, no apparent discharge at introitus.  -Vaginal Vault: Pink mucosa with good rugae. Small dime sized clot noted and removed with faux swab -wet prep collected -Cervix:Pink, no lesions, cysts, or polyps.  Appears open with ?POC at os, attempted to remove with ring forcep. No active bleeding from os-GC/CT collected -Bimanual Exam:  Uterine  size c/w 6-[redacted] wk GA.  No tenderness   Musculoskeletal:        General: Normal range of motion.  Skin:    General: Skin is warm and dry.  Neurological:     Mental Status: She is alert and oriented to person, place, and time.  Psychiatric:        Mood and Affect: Mood normal.        Behavior: Behavior normal.        Thought Content: Thought content normal.    MAU Course  Procedures Results for orders placed or performed during the hospital encounter of 10/16/20 (from the past 24 hour(s))  CBC     Status: Abnormal   Collection Time: 10/17/20  1:35 AM  Result Value Ref Range   WBC 10.7 (H) 4.0 - 10.5 K/uL   RBC 4.28 3.87 - 5.11 MIL/uL   Hemoglobin 13.4 12.0 - 15.0 g/dL   HCT 35.0 09.3 - 81.8 %   MCV 92.3 80.0 - 100.0 fL   MCH 31.3 26.0 - 34.0 pg   MCHC 33.9 30.0 - 36.0 g/dL   RDW 29.9 37.1 - 69.6 %   Platelets 249 150 - 400 K/uL   nRBC 0.0 0.0 - 0.2 %  hCG, quantitative, pregnancy     Status: Abnormal   Collection Time: 10/17/20  1:35 AM  Result Value Ref Range   hCG, Beta Chain, Quant, S 210,343 (H) <5 mIU/mL  Wet prep, genital     Status: Abnormal   Collection Time: 10/17/20  1:46 AM   Specimen: PATH Cytology Cervicovaginal Ancillary Only  Result Value Ref Range   Yeast Wet Prep HPF POC NONE SEEN NONE SEEN   Trich, Wet Prep NONE SEEN NONE SEEN   Clue Cells Wet Prep HPF POC NONE SEEN NONE SEEN   WBC, Wet Prep HPF POC FEW (A) NONE SEEN   Sperm NONE SEEN    US OB Comp Less 14 Wks  Result Date:  10/17/2020 CLINICAL DATA:  Vaginal bleeding EXAM: OBSTETRIC <14 WK ULTRASOUND TECHNIQUE: Transabdominal ultrasound was performed for evaluation of the gestation as well as the maternal uterus and adnexal regions. COMPARISON:  Ultrasound 05/19/2020 FINDINGS: Intrauterine gestational sac: Single Yolk sac:  Visualized. Embryo:  Visualized. Cardiac Activity: Visualized. Heart Rate: 173 bpm CRL: 17.9 mm   8 w 1 d                  [place] EDC: 05/28/2021 Subchorionic hemorrhage:  None visualized. Maternal uterus/adnexae: Anteverted maternal uterus is otherwise. Normal appearance of left ovary. Right ovary is not visualized. No pelvic free fluid. IMPRESSION: 1. Single viable intrauterine gestation at 8 weeks 1 day by crown-rump length sonographic estimation. 2. Nonvisualization of the right ovary. 3. No acute sonographic complication is evident. Electronically Signed   By: Kreg Shropshire M.D.   On: 10/17/2020 02:15    MDM Pelvic Exam; Wet Prep and GC/CT Labs: CBC, hCG, ABO Ultrasound Assessment and Plan  35 year old [id number] at 8. 4 weeks Vaginal Bleeding  -Reviewed POC with patient. -Exam performed and findings discussed.  -Informed that clots noted is suspicious for POC and that cervix appears open, which is concerning. -However, uterine size is c/w dates. -Will send for [place] and await results.  -Labs pending.   Joyce Copa Edana Aguado 10/17/2020, 1:29 AM   Reassessment (2:21 AM) SIUP at 8.1 weeks  -Results return as above. -Provider at bedside to review. -Patient shown imaging with FHR noted for reassurance. -Patient expresses  appreciation. -Instructed to maintain pelvic rest for at least 72 hours after cessation of bleeding. -No questions or concerns. -Encouraged to call or return to MAU if symptoms worsen or with the onset of new symptoms. -Discharged to home in stable condition.  Cherre Robins MSN, CNM Advanced Practice Provider, [facility] [employer]

## 2020-10-17 NOTE — MAU Note (Addendum)
I am bariatric patient and had abdominal cramping and had a BM and saw blood when I wiped. This happened about 2200.I threw up at the same time. AFter cleaning up everything and wiped I saw blood on tissue from vagina. Went back to [facility] and about later and had more vaginal bleeding with many clots. No abd pain currently. Hx incompetent cervix

## 2020-10-19 LAB — GC/CHLAMYDIA PROBE AMP (~~LOC~~) NOT AT ARMC
Chlamydia: NEGATIVE
Comment: NEGATIVE
Comment: NORMAL
Neisseria Gonorrhea: NEGATIVE

## 2020-10-21 ENCOUNTER — Ambulatory Visit (INDEPENDENT_AMBULATORY_CARE_PROVIDER_SITE_OTHER): Payer: 59 | Admitting: Family Medicine

## 2020-10-28 LAB — OB RESULTS CONSOLE RPR: RPR: NONREACTIVE

## 2020-10-28 LAB — OB RESULTS CONSOLE HEPATITIS B SURFACE ANTIGEN: Hepatitis B Surface Ag: NEGATIVE

## 2020-10-28 LAB — OB RESULTS CONSOLE VARICELLA ZOSTER ANTIBODY, IGG: Varicella: IMMUNE

## 2020-10-28 LAB — OB RESULTS CONSOLE HIV ANTIBODY (ROUTINE TESTING): HIV: NONREACTIVE

## 2020-10-28 LAB — OB RESULTS CONSOLE RUBELLA ANTIBODY, IGM: Rubella: IMMUNE

## 2020-11-02 ENCOUNTER — Other Ambulatory Visit: Payer: Self-pay | Admitting: Obstetrics and Gynecology

## 2020-11-10 ENCOUNTER — Telehealth (HOSPITAL_COMMUNITY): Payer: Self-pay | Admitting: *Deleted

## 2020-11-10 ENCOUNTER — Encounter (HOSPITAL_COMMUNITY): Payer: Self-pay | Admitting: *Deleted

## 2020-11-10 NOTE — Telephone Encounter (Signed)
Preadmission screen NPO after MN.  Take lamictal as prescribed.  Arrive at 0800 on 7/12.  Verbalized instructions and understanding.  All questions answered.

## 2020-11-11 ENCOUNTER — Encounter: Payer: Self-pay | Admitting: Cardiology

## 2020-11-11 ENCOUNTER — Telehealth: Payer: Self-pay

## 2020-11-11 DIAGNOSIS — R002 Palpitations: Secondary | ICD-10-CM | POA: Insufficient documentation

## 2020-11-11 DIAGNOSIS — R42 Dizziness and giddiness: Secondary | ICD-10-CM | POA: Insufficient documentation

## 2020-11-11 NOTE — Progress Notes (Signed)
Cardiology Office Note   Date:  11/12/2020   ID:  Brooke, [name] 12/20/1985, MRN [id number]  PCP:  Kandyce Rud, MD  Cardiologist:   Rollene Rotunda, MD Referring:  Olivia Mackie, MD   Chief Complaint  Patient presents with   Palpitations       History of Present Illness: Brooke [name] is a 35 y.o. female who is referred by Olivia Mackie, MD for evaluation of palpitations and presyncope.  The patient has no past cardiac history.  She is 3 months pregnant.  She had bariatric surgery in September and since then has been lightheaded.  However, for the last 3 months she has been having a hard throbbing slow boom in her chest.  This has been more intense over 3 weeks.  It was very severe on Friday.  She is not describing a rapid rate.  She is not describing it as irregular.  However, it is a strong pounding and she gets lightheaded.  It would be worse with turning over in the bed.  It was worse standing up.  She will get lightheaded.  She did not have any frank syncope.  It can be very severe for 20 seconds with dizziness.  She has never had any prior cardiac testing other than a stress test prior to her bariatric surgery in the fall of last year.  She denies any shortness of breath, PND or orthopnea.  She had 100 pound weight loss with her surgery.   Past Medical History:  Diagnosis Date   Alopecia    wears hair topper -   Anxiety    Arthritis    Back pain    Depression    Epilepsy (HCC)    GAD (generalized anxiety disorder)    GERD (gastroesophageal reflux disease)    History of concussion age 16   per pt hit in head w/ softball , forehead area,  no loc had multiple stitches   Hypertension    Infertility, female    MDD (major depressive disorder)    Migraines    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 47   (06-15-2020  per pt no seizure since 05-19-2020)   neurologist--- dr Epimenio Foot--- pt previously seen by dr  hickling/ dr Modesto Charon,  pt was referred to dr sater from ED visit / overnight stay for pt's first two generalized tonic clonic seizure's , pt had stated unable to sleep (second one was witnessed in ED);  per dr sater note in epic 06-09-2020 pt is back to baseline with intermittant myoclinic jerks with medication adjustement, normal head CT 05-19-2020   PCOS (polycystic ovarian syndrome)    Prediabetes    Sleep apnea     Past Surgical History:  Procedure Laterality Date   CERVICAL CERCLAGE N/A 06/26/2014   Procedure: CERCLAGE CERVICAL;  Surgeon: Lenoard Aden, MD;  Location: [facility];  Service: Gynecology;  Laterality: N/A;   DILATATION & CURETTAGE/HYSTEROSCOPY WITH MYOSURE N/A 02/16/2017   Procedure: DILATATION & CURETTAGE/HYSTEROSCOPY WITH MYOSURE RESECTION OF ENDOMETRIAL POLYP;  Surgeon: Olivia Mackie, MD;  Location: [facility];  Service: Gynecology;  Laterality: N/A;   DILATION AND EVACUATION N/A 06/17/2020   Procedure: DILATATION AND EVACUATION;  Surgeon: Olivia Mackie, MD;  Location: [facility];  Service: Gynecology;  Laterality: N/A;   ROUX-EN-Y GASTRIC BYPASS  02-03-2020  @ [facility]   LAPAROSCOPIC   TONSILLECTOMY  CHILD   TYMPANOSTOMY TUBE PLACEMENT Bilateral child   WISDOM TOOTH EXTRACTION  age  13     Current Outpatient Medications  Medication Sig Dispense Refill   acetaminophen (TYLENOL) 500 MG tablet Take 500-1,000 mg by mouth every 8 (eight) hours as needed for moderate pain or headache.     calcium carbonate (TUMS - DOSED IN MG ELEMENTAL CALCIUM) 500 MG chewable tablet Chew 3 tablets by mouth daily.     lamoTRIgine (LAMICTAL) 150 MG tablet Take 1 tablet (150 mg total) by mouth 2 (two) times daily. 60 tablet 11   Magnesium 400 MG CAPS Take 400 mg by mouth daily in the afternoon.     Multiple Vitamin (MULTI VITAMIN PO) Take 1 tablet by mouth daily. Bariatric mutlti vitamin     pantoprazole (PROTONIX) 40 MG tablet Take 1 tablet (40 mg total) by mouth at bedtime. 90 tablet  3   No current facility-administered medications for this visit.    Allergies:   Patient has no known allergies.    Social History:  The patient  reports that she has never smoked. She has never used smokeless tobacco. She reports that she does not drink alcohol and does not use drugs.   Family History:  The patient's family history includes Alcohol abuse in her maternal grandmother; Diabetes in her mother; Hearing loss in her paternal grandfather; Heart disease in her mother; Hypertension in her father and mother; Obesity in her father and mother.    ROS:  Please see the history of present illness.   Otherwise, review of systems are positive for none.   All other systems are reviewed and negative.    PHYSICAL EXAM: VS:  BP 120/64   Pulse 63   Ht 5\' 4"  (1.626 m)   Wt 154 lb 9.6 oz (70.1 kg)   LMP 08/18/2020   SpO2 99%   BMI 26.54 kg/m  , BMI Body mass index is 26.54 kg/m. GENERAL:  Well appearing HEENT:  Pupils equal round and reactive, fundi not visualized, oral mucosa unremarkable NECK:  No jugular venous distention, waveform within normal limits, carotid upstroke brisk and symmetric, no bruits, no thyromegaly LYMPHATICS:  No cervical, inguinal adenopathy LUNGS:  Clear to auscultation bilaterally BACK:  No CVA tenderness CHEST:  Unremarkable HEART:  PMI not displaced or sustained,S1 and S2 within normal limits, no S3, no S4, no clicks, no rubs, no murmurs ABD:  Flat, positive bowel sounds normal in frequency in pitch, no bruits, no rebound, no guarding, no midline pulsatile mass, no hepatomegaly, no splenomegaly EXT:  2 plus pulses throughout, no edema, no cyanosis no clubbing SKIN:  No rashes no nodules NEURO:  Cranial nerves II through XII grossly intact, motor grossly intact throughout PSYCH:  Cognitively intact, oriented to person place and time    EKG:  EKG is ordered today. The ekg ordered today demonstrates sinus rhythm, rate 63, axis within normal limits,  intervals within normal limits, no acute ST-T wave changes.   Recent Labs: 07/30/2020: ALT 27; BUN 13; Creatinine, Ser 0.77; Magnesium 2.1; Potassium 4.5; Sodium 140; TSH 1.340 10/17/2020: Hemoglobin 13.4; Platelets 249    Lipid Panel    Component Value Date/Time   CHOL 151 07/30/2020 0956   TRIG 83 07/30/2020 0956   HDL 61 07/30/2020 0956   CHOLHDL 2.5 07/30/2020 0956   LDLCALC 74 07/30/2020 0956      Wt Readings from Last 3 Encounters:  11/12/20 154 lb 9.6 oz (70.1 kg)  10/17/20 160 lb (72.6 kg)  09/21/20 160 lb (72.6 kg)      Other studies Reviewed: Additional  studies/ records that were reviewed today include: OB records. Review of the above records demonstrates:  Please see elsewhere in the note.     ASSESSMENT AND PLAN:  PALPITATIONS: I am going to apply a 3-day monitor.  Of note the patient was not orthostatic in the office today.  She has had normal labs to include a TSH this spring.  Electrolytes have been normal.  Further evaluation will be based on this.  PRESYNCOPE: Again she was not orthostatic.  I will evaluate as above.  PREOP: The patient needs a cerclage.  I do not see any contraindications to this procedure and general anesthesia if necessary.  There are no cardiovascular indications that she would be able to proceed.  Current medicines are reviewed at length with the patient today.  The patient does not have concerns regarding medicines.  The following changes have been made:  no change  Labs/ tests ordered today include:   Orders Placed This Encounter  Procedures   LONG TERM MONITOR (3-14 DAYS)   EKG 12-Lead     Disposition:   FU with me in about 6 weeks or so after this monitor.   Signed, Rollene Rotunda, MD  11/12/2020 5:45 PM    [facility]

## 2020-11-11 NOTE — Telephone Encounter (Signed)
NOTES ON FILE FROM [facility] [phone]...SENT REFERRAL TO SCHEDULING- 

## 2020-11-12 ENCOUNTER — Ambulatory Visit (INDEPENDENT_AMBULATORY_CARE_PROVIDER_SITE_OTHER): Payer: 59 | Admitting: Cardiology

## 2020-11-12 ENCOUNTER — Encounter: Payer: Self-pay | Admitting: Cardiology

## 2020-11-12 ENCOUNTER — Other Ambulatory Visit: Payer: Self-pay

## 2020-11-12 VITALS — BP 120/64 | HR 63 | Ht 64.0 in | Wt 154.6 lb

## 2020-11-12 DIAGNOSIS — R42 Dizziness and giddiness: Secondary | ICD-10-CM

## 2020-11-12 DIAGNOSIS — R002 Palpitations: Secondary | ICD-10-CM

## 2020-11-12 NOTE — Patient Instructions (Signed)
Medication Instructions:  Your physician recommends that you continue on your current medications as directed. Please refer to the Current Medication list given to you today.   *If you need a refill on your cardiac medications before your next appointment, please call your pharmacy*  Lab Work: NONE  Testing/Procedures:  3 DAY ZIO MONITOR   Follow-Up: At [facility], you and your health needs are our priority.  As part of our continuing mission to provide you with exceptional heart care, we have created designated Provider Care Teams.  These Care Teams include your primary Cardiologist (physician) and Advanced Practice Providers (APPs -  Physician Assistants and Nurse Practitioners) who all work together to provide you with the care you need, when you need it.  We recommend signing up for the patient portal called "MyChart".  Sign up information is provided on this After Visit Summary.  MyChart is used to connect with patients for Virtual Visits (Telemedicine).  Patients are able to view lab/test results, encounter notes, upcoming appointments, etc.  Non-urgent messages can be sent to your provider as well.   To learn more about what you can do with MyChart, go to [email or web address].    Your next appointment:   4 week(s)  The format for your next appointment:   In Person  Provider:   You may see DR Antoine Poche  or one of the following Advanced Practice Providers on your designated Care Team:   Theodore Demark, PA-C Juanda Crumble, PA-C Joni Reining, DNP, ANP   Other Instructions  Christena Deem- Long Term Monitor Instructions  Your physician has requested you wear a ZIO patch monitor for 3 days.  This is a single patch monitor. [employer] supplies one patch monitor per enrollment. Additional stickers are not available. Please do not apply patch if you will be having a Nuclear Stress Test,  Echocardiogram, Cardiac CT, MRI, or Chest Xray during the period you would be wearing the   monitor. The patch cannot be worn during these tests. You cannot remove and re-apply the  ZIO XT patch monitor.  Your ZIO patch monitor will be mailed 3 day USPS to your address on file. It may take 3-5 days  to receive your monitor after you have been enrolled.  Once you have received your monitor, please review the enclosed instructions. Your monitor  has already been registered assigning a specific monitor serial # to you.  Billing and Patient Assistance Program Information  We have supplied [employer] with any of your insurance information on file for billing purposes. [employer] offers a sliding scale Patient Assistance Program for patients that do not have  insurance, or whose insurance does not completely cover the cost of the ZIO monitor.  You must apply for the Patient Assistance Program to qualify for this discounted rate.  To apply, please call [employer] at [phone], select option 4, select option 2, ask to apply for  Patient Assistance Program. Meredeth Ide will ask your household income, and how many people  are in your household. They will quote your out-of-pocket cost based on that information.  [employer] will also be able to set up a 71-month, interest-free payment plan if needed.  Applying the monitor   Shave hair from upper left chest.  Hold abrader disc by orange tab. Rub abrader in 40 strokes over the upper left chest as  indicated in your monitor instructions.  Clean area with 4 enclosed alcohol pads. Let dry.  Apply patch as indicated in monitor instructions. Patch will be placed under  collarbone on left  side of chest with arrow pointing upward.  Rub patch adhesive wings for 2 minutes. Remove white label marked "1". Remove the white  label marked "2". Rub patch adhesive wings for 2 additional minutes.  While looking in a mirror, press and release button in center of patch. A small green light will  flash 3-4 times. This will be your only indicator that the monitor has been  turned on.  Do not shower for the first 24 hours. You may shower after the first 24 hours.  Press the button if you feel a symptom. You will hear a small click. Record Date, Time and  Symptom in the Patient Logbook.  When you are ready to remove the patch, follow instructions on the last 2 pages of Patient  Logbook. Stick patch monitor onto the last page of Patient Logbook.  Place Patient Logbook in the blue and white box. Use locking tab on box and tape box closed  securely. The blue and white box has prepaid postage on it. Please place it in the mailbox as  soon as possible. Your physician should have your test results approximately 7 days after the  monitor has been mailed back to [facility].  Call [facility] Customer Care at [phone] if you have questions regarding  your ZIO XT patch monitor. Call them immediately if you see an orange light blinking on your  monitor.  If your monitor falls off in less than 4 days, contact our Monitor department at [phone].  If your monitor becomes loose or falls off after 4 days call [employer] at [phone] for  suggestions on securing your monitor

## 2020-11-13 ENCOUNTER — Ambulatory Visit (INDEPENDENT_AMBULATORY_CARE_PROVIDER_SITE_OTHER): Payer: 59

## 2020-11-13 DIAGNOSIS — R42 Dizziness and giddiness: Secondary | ICD-10-CM

## 2020-11-13 DIAGNOSIS — R002 Palpitations: Secondary | ICD-10-CM

## 2020-11-13 NOTE — Progress Notes (Unsigned)
Enrolled patient for a 3 day Zio XT monitor to be mailed to patients home  

## 2020-11-16 LAB — OB RESULTS CONSOLE GC/CHLAMYDIA
Chlamydia: NEGATIVE
Gonorrhea: NEGATIVE

## 2020-11-16 LAB — OB RESULTS CONSOLE GBS: GBS: POSITIVE

## 2020-11-17 ENCOUNTER — Ambulatory Visit (HOSPITAL_COMMUNITY): Payer: 59 | Admitting: Anesthesiology

## 2020-11-17 ENCOUNTER — Other Ambulatory Visit: Payer: Self-pay

## 2020-11-17 ENCOUNTER — Ambulatory Visit (HOSPITAL_COMMUNITY)
Admission: RE | Admit: 2020-11-17 | Discharge: 2020-11-17 | Disposition: A | Discharge status: Home Health | Payer: 59 | Attending: Obstetrics and Gynecology | Admitting: Obstetrics and Gynecology

## 2020-11-17 ENCOUNTER — Encounter (HOSPITAL_COMMUNITY): Payer: Self-pay | Admitting: Obstetrics and Gynecology

## 2020-11-17 ENCOUNTER — Encounter (HOSPITAL_COMMUNITY)
Admission: RE | Disposition: A | Discharge status: Home Health | Payer: Self-pay | Source: Home / Self Care | Attending: Obstetrics and Gynecology

## 2020-11-17 DIAGNOSIS — O3431 Maternal care for cervical incompetence, first trimester: Secondary | ICD-10-CM | POA: Insufficient documentation

## 2020-11-17 DIAGNOSIS — Z3A13 13 weeks gestation of pregnancy: Secondary | ICD-10-CM | POA: Insufficient documentation

## 2020-11-17 HISTORY — PX: CERVICAL CERCLAGE: SHX1329

## 2020-11-17 LAB — CBC
HCT: 39.3 % (ref 36.0–46.0)
Hemoglobin: 13.8 g/dL (ref 12.0–15.0)
MCH: 31.9 pg (ref 26.0–34.0)
MCHC: 35.1 g/dL (ref 30.0–36.0)
MCV: 91 fL (ref 80.0–100.0)
Platelets: 228 10*3/uL (ref 150–400)
RBC: 4.32 MIL/uL (ref 3.87–5.11)
RDW: 12.5 % (ref 11.5–15.5)
WBC: 6.8 10*3/uL (ref 4.0–10.5)
nRBC: 0 % (ref 0.0–0.2)

## 2020-11-17 SURGERY — CERCLAGE, CERVIX, VAGINAL APPROACH
Anesthesia: General

## 2020-11-17 MED ORDER — SUCCINYLCHOLINE CHLORIDE 20 MG/ML IJ SOLN
INTRAMUSCULAR | Status: DC | PRN
  Administered 2020-11-17: 140 mg via INTRAVENOUS

## 2020-11-17 MED ORDER — LIDOCAINE HCL (CARDIAC) PF 100 MG/5ML IV SOSY
PREFILLED_SYRINGE | INTRAVENOUS | Status: DC | PRN
  Administered 2020-11-17: 100 mg via INTRAVENOUS

## 2020-11-17 MED ORDER — FENTANYL CITRATE (PF) 100 MCG/2ML IJ SOLN: 25.0000 ug | INTRAMUSCULAR | Status: DC | PRN

## 2020-11-17 MED ORDER — SODIUM CHLORIDE 0.9 % IR SOLN
Status: DC | PRN
  Administered 2020-11-17: 1

## 2020-11-17 MED ORDER — POVIDONE-IODINE 10 % EX SWAB
2.0000 "application " | Freq: Once | CUTANEOUS | Status: AC
  Administered 2020-11-17: 2 via TOPICAL

## 2020-11-17 MED ORDER — PROPOFOL 10 MG/ML IV BOLUS
INTRAVENOUS | Status: AC
  Filled 2020-11-17: qty 20

## 2020-11-17 MED ORDER — ONDANSETRON HCL 4 MG/2ML IJ SOLN: 4.0000 mg | Freq: Once | INTRAMUSCULAR | Status: DC | PRN

## 2020-11-17 MED ORDER — GLYCOPYRROLATE PF 0.2 MG/ML IJ SOSY
PREFILLED_SYRINGE | INTRAMUSCULAR | Status: AC
  Filled 2020-11-17: qty 1

## 2020-11-17 MED ORDER — CEFAZOLIN SODIUM-DEXTROSE 2-4 GM/100ML-% IV SOLN
INTRAVENOUS | Status: AC
  Filled 2020-11-17: qty 100

## 2020-11-17 MED ORDER — GLYCOPYRROLATE 0.2 MG/ML IJ SOLN
INTRAMUSCULAR | Status: DC | PRN
  Administered 2020-11-17: .2 mg via INTRAVENOUS

## 2020-11-17 MED ORDER — FENTANYL CITRATE (PF) 100 MCG/2ML IJ SOLN
INTRAMUSCULAR | Status: DC | PRN
  Administered 2020-11-17: 100 ug via INTRAVENOUS

## 2020-11-17 MED ORDER — PROPOFOL 10 MG/ML IV BOLUS
INTRAVENOUS | Status: DC | PRN
  Administered 2020-11-17: 200 mg via INTRAVENOUS

## 2020-11-17 MED ORDER — DEXAMETHASONE SODIUM PHOSPHATE 10 MG/ML IJ SOLN
INTRAMUSCULAR | Status: DC | PRN
  Administered 2020-11-17: 5 mg via INTRAVENOUS

## 2020-11-17 MED ORDER — ONDANSETRON HCL 4 MG/2ML IJ SOLN
INTRAMUSCULAR | Status: DC | PRN
  Administered 2020-11-17: 4 mg via INTRAVENOUS

## 2020-11-17 MED ORDER — SUCCINYLCHOLINE CHLORIDE 200 MG/10ML IV SOSY
PREFILLED_SYRINGE | INTRAVENOUS | Status: AC
  Filled 2020-11-17: qty 10

## 2020-11-17 MED ORDER — LIDOCAINE 2% (20 MG/ML) 5 ML SYRINGE
INTRAMUSCULAR | Status: AC
  Filled 2020-11-17: qty 5

## 2020-11-17 MED ORDER — CEFAZOLIN SODIUM-DEXTROSE 2-4 GM/100ML-% IV SOLN
2.0000 g | INTRAVENOUS | Status: AC
  Administered 2020-11-17: 2 g via INTRAVENOUS

## 2020-11-17 MED ORDER — LACTATED RINGERS IV SOLN: INTRAVENOUS | Status: DC

## 2020-11-17 MED ORDER — FENTANYL CITRATE (PF) 100 MCG/2ML IJ SOLN
INTRAMUSCULAR | Status: AC
  Filled 2020-11-17: qty 2

## 2020-11-17 SURGICAL SUPPLY — 18 items
CANISTER SUCT 3000ML PPV (MISCELLANEOUS) ×2 IMPLANT
GLOVE BIO SURGEON STRL SZ7.5 (GLOVE) ×2 IMPLANT
GLOVE BIOGEL PI IND STRL 7.0 (GLOVE) ×1 IMPLANT
GLOVE BIOGEL PI INDICATOR 7.0 (GLOVE) ×1
GOWN STRL REUS W/TWL LRG LVL3 (GOWN DISPOSABLE) ×6 IMPLANT
NDL MAYO CATGUT SZ4 TPR NDL (NEEDLE) ×1 IMPLANT
NEEDLE MAYO CATGUT SZ4 (NEEDLE) ×2 IMPLANT
NS IRRIG 1000ML POUR BTL (IV SOLUTION) ×2 IMPLANT
PACK VAGINAL MINOR WOMEN LF (CUSTOM PROCEDURE TRAY) ×2 IMPLANT
PAD OB MATERNITY 4.3X12.25 (PERSONAL CARE ITEMS) ×2 IMPLANT
PAD PREP 24X48 CUFFED NSTRL (MISCELLANEOUS) ×2 IMPLANT
SUT ETHIBOND  5 (SUTURE) ×2
SUT ETHIBOND 5 (SUTURE) ×1 IMPLANT
SUT PROLENE 0 CT 1 30 (SUTURE) ×2 IMPLANT
TOWEL OR 17X24 6PK STRL BLUE (TOWEL DISPOSABLE) ×4 IMPLANT
TRAY FOLEY W/BAG SLVR 14FR (SET/KITS/TRAYS/PACK) ×2 IMPLANT
TUBING NON-CON 1/4 X 20 CONN (TUBING) IMPLANT
YANKAUER SUCT BULB TIP NO VENT (SUCTIONS) IMPLANT

## 2020-11-17 NOTE — Anesthesia Postprocedure Evaluation (Signed)
Anesthesia Post Note  Patient: Brooke [name]  Procedure(s) Performed: [facility] CERVICAL CERCLAGE     Patient location during evaluation: PACU Anesthesia Type: General Level of consciousness: awake and alert and oriented Pain management: pain level controlled Vital Signs Assessment: post-procedure vital signs reviewed and stable Respiratory status: spontaneous breathing, nonlabored ventilation and respiratory function stable Cardiovascular status: blood pressure returned to baseline and stable Postop Assessment: no apparent nausea or vomiting Anesthetic complications: no   No notable events documented.  Last Vitals:  Vitals:   11/17/20 1045 11/17/20 1100  BP: 130/84 114/65  Pulse: 99 65  Resp: 18 15  Temp: 36.9 C   SpO2: 100% 96%    Last Pain:  Vitals:   11/17/20 0758  TempSrc: Oral   Pain Goal:                   Jeffrey Graefe A.

## 2020-11-17 NOTE — Op Note (Signed)
NAME: Brooke [name], Brooke [name] MEDICAL RECORD NO: [id number] ACCOUNT NO: [id number] DATE OF BIRTH: Jul 18, 1985 FACILITY: [facility] LOCATION: [facility] PHYSICIAN: Lenoard Aden, MD  Operative Report   DATE OF PROCEDURE: 11/17/2020  PREOPERATIVE DIAGNOSIS:  History of cervical insufficiency.  POSTOPERATIVE DIAGNOSIS:  History of cervical insufficiency.  PROCEDURE:  McDonald transvaginal cervical cerclage at 13 weeks.  SURGEON:  Olivia Mackie, MD  ASSISTANT:  Dorisann Frames, CNM  ANESTHESIA:  General by Dr. Malen Gauze.  ESTIMATED BLOOD LOSS:  5 mL.  COMPLICATIONS:  None.  DRAINS:  Foley.  COUNTS:  Correct.  DISPOSITION:  The patient to recovery in good condition.  BRIEF OPERATIVE NOTE:  After being apprised of the risks of anesthesia, infection, bleeding, injury to surrounding organs, possible need for repair, delayed versus immediate complications including bowel and bladder injury, possible need for repair,  inability to prevent preterm birth, the patient was brought to the operating room where she was administered a general anesthetic without complications.  Prepped and draped in usual sterile fashion.  Foley catheter placed.  Feet are placed in the  Yellofin stirrups without difficulty.  Exam under anesthesia reveals a closed 2 cm in length cervix.  At this time, a 5 Ethibond suture is used with a free needle to place a standard transvaginal cerclage from 1 o'clock to 11 o'clock, 10 o'clock to 8  o'clock, 7 o'clock to 5 o'clock, 4 o'clock to 1 o'clock, is tied over a Prolene suture without difficulty.  Good hemostasis noted.  Fetal heart tones will be auscultated post-procedure.  The patient tolerated the procedure well and was transferred to  recovery in good condition.   SHW D: 11/17/2020 10:39:02 am T: 11/17/2020 10:58:00 am  JOB: [id number]/ [id number]

## 2020-11-17 NOTE — Anesthesia Procedure Notes (Signed)
Procedure Name: Intubation Date/Time: 11/17/2020 10:06 AM Performed by: Lenox Ahr, CRNA Pre-anesthesia Checklist: Patient identified, Patient being monitored, Timeout performed, Emergency Drugs available and Suction available Patient Re-evaluated:Patient Re-evaluated prior to induction Oxygen Delivery Method: Circle System Utilized Preoxygenation: Pre-oxygenation with 100% oxygen Induction Type: IV induction Ventilation: Mask ventilation without difficulty Laryngoscope Size: Miller and 2 Grade View: Grade II Tube type: Oral Tube size: 7.0 mm Number of attempts: 1 Airway Equipment and Method: stylet Placement Confirmation: ETT inserted through vocal cords under direct vision, positive ETCO2 and breath sounds checked- equal and bilateral Secured at: 21 cm Tube secured with: Tape Dental Injury: Teeth and Oropharynx as per pre-operative assessment

## 2020-11-17 NOTE — Transfer of Care (Signed)
Immediate Anesthesia Transfer of Care Note  Patient: Brooke [name]  Procedure(s) Performed: McDonald CERVICAL CERCLAGE  Patient Location: PACU  Anesthesia Type:General  Level of Consciousness: awake, alert  and oriented  Airway & Oxygen Therapy: Patient Spontanous Breathing and Patient connected to face mask oxygen  Post-op Assessment: Report given to RN, Post -op Vital signs reviewed and stable and Patient moving all extremities X 4  Post vital signs: Reviewed and stable  Last Vitals:  Vitals Value Taken Time  BP 130/84 11/17/20 1045  Temp 36.9 C 11/17/20 1045  Pulse 96 11/17/20 1049  Resp 14 11/17/20 1049  SpO2 100 % 11/17/20 1049  Vitals shown include unvalidated device data.  Last Pain:  Vitals:   11/17/20 0758  TempSrc: Oral         Complications: No notable events documented.

## 2020-11-17 NOTE — Op Note (Signed)
11/17/2020  10:32 AM  PATIENT:  Brooke [name]  35 y.o. female  PRE-OPERATIVE DIAGNOSIS:  Cervical Incompetence  POST-OPERATIVE DIAGNOSIS:  * No post-op diagnosis entered *  PROCEDURE:  Procedure(s): McDonald CERVICAL CERCLAGE  SURGEON:  Surgeon(s): Olivia Mackie, MD  ASSISTANTS: Dorisann Frames, CNM   ANESTHESIA:   general  ESTIMATED BLOOD LOSS: 5 mL   DRAINS: Urinary Catheter (Foley)   LOCAL MEDICATIONS USED:  NONE  SPECIMEN:  No Specimen  DISPOSITION OF SPECIMEN:  N/A  COUNTS:  YES  DICTATION #: [id number]  PLAN OF CARE: dc  home  PATIENT DISPOSITION:  PACU - hemodynamically stable.

## 2020-11-17 NOTE — H&P (Signed)
Brooke [name] is an 35 y.o. female. Cervical insufficiency for prophylactic cerclage  Pertinent Gynecological History: Menses: flow is light Bleeding: na Contraception: none DES exposure: denies Blood transfusions: none Sexually transmitted diseases: no past history Previous GYN Procedures: DNC  Last mammogram:  na  Date: na Last pap: normal Date: 2022 OB History: G4, P1   Menstrual History: Menarche age: 5 Patient's last menstrual period was 08/18/2020.    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   (06-15-2020  per pt no seizure since 05-19-2020)   neurologist--- dr Epimenio Foot--- pt previously seen by dr hickling/ dr Modesto Charon,  pt was referred to dr sater from ED visit / overnight stay for pt's first two generalized tonic clonic seizure's , pt had stated unable to sleep (second one was witnessed in ED);  per dr sater note in epic 06-09-2020 pt is back to baseline with intermittant myoclinic jerks with medication adjustement, normal head CT 05-19-2020   PCOS (polycystic ovarian syndrome)    Prediabetes    Sleep apnea     Past Surgical History:  Procedure Laterality Date   CERVICAL CERCLAGE N/A 06/26/2014   Procedure: CERCLAGE CERVICAL;  Surgeon: Lenoard Aden, MD;  Location: [facility];  Service: Gynecology;  Laterality: N/A;   DILATATION & CURETTAGE/HYSTEROSCOPY WITH MYOSURE N/A 02/16/2017   Procedure: DILATATION & CURETTAGE/HYSTEROSCOPY WITH MYOSURE RESECTION OF ENDOMETRIAL POLYP;  Surgeon: Olivia Mackie, MD;   Location: [facility];  Service: Gynecology;  Laterality: N/A;   DILATION AND EVACUATION N/A 06/17/2020   Procedure: DILATATION AND EVACUATION;  Surgeon: Olivia Mackie, MD;  Location: [facility];  Service: Gynecology;  Laterality: N/A;   ROUX-EN-Y GASTRIC BYPASS  02-03-2020  @ [facility]   LAPAROSCOPIC   TONSILLECTOMY  CHILD   TYMPANOSTOMY TUBE PLACEMENT Bilateral child   WISDOM TOOTH EXTRACTION  age 9    Family History  Problem Relation Age of Onset   Diabetes Mother    Hypertension Mother    Heart disease Mother    Obesity Mother    Hypertension Father    Obesity Father    Alcohol abuse Maternal Grandmother    Hearing loss Paternal Grandfather     Social History:  reports that she has never smoked. She has never used smokeless tobacco. She reports that she does not drink alcohol and does not use drugs.  Allergies: No Known Allergies  No medications prior to admission.    Review of Systems  Constitutional: Negative.   All other systems reviewed and are negative.  Last menstrual period 08/18/2020, unknown if currently breastfeeding. Physical Exam Constitutional:      Appearance: Normal appearance. She is normal weight.  HENT:     Head: Normocephalic and atraumatic.  Cardiovascular:     Rate and Rhythm: Normal rate and regular rhythm.     Pulses: Normal pulses.     Heart sounds: Normal heart sounds.  Pulmonary:  Effort: Pulmonary effort is normal.     Breath sounds: Normal breath sounds.  Abdominal:     General: Abdomen is flat. Bowel sounds are normal.  Genitourinary:    General: Normal vulva.  Musculoskeletal:        General: Normal range of motion.     Cervical back: Normal range of motion and neck supple.  Skin:    General: Skin is warm and dry.  Neurological:     General: No focal deficit present.     Mental Status: She is alert and oriented to person, place, and time.  Psychiatric:        Mood and Affect: Mood normal.        Behavior: Behavior  normal.    No results found for this or any previous visit (from the past 24 hour(s)).  No results found.  Assessment/Plan: 13 week IUP NL NIPS, NL NT Cervical insufficiency for McDonald cerclage Surgical risks discussed. Consent done.  Markevius Trombetta J 11/17/2020, 6:37 AM

## 2020-11-17 NOTE — Anesthesia Preprocedure Evaluation (Addendum)
Anesthesia Evaluation  Patient identified by MRN, date of birth, ID band Patient awake    Reviewed: Allergy & Precautions, NPO status , Patient's Chart, lab work & pertinent test results  Airway Mallampati: I  TM Distance: >3 FB Neck ROM: Full    Dental no notable dental hx. 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   Endo/Other  Hx/o PCOS  Renal/GU negative Renal ROS     Musculoskeletal  (+) Arthritis ,   Abdominal   Peds  Hematology negative hematology ROS (+)   Anesthesia Other Findings   Reproductive/Obstetrics                             Anesthesia Physical Anesthesia Plan  ASA: 2  Anesthesia Plan: General   Post-op Pain Management:    Induction: Intravenous  PONV Risk Score and Plan: 4 or greater and Treatment may vary due to age or medical condition  Airway Management Planned: Oral ETT  Additional Equipment:   Intra-op Plan:   Post-operative Plan: Extubation in OR  Informed Consent: I have reviewed the patients History and Physical, chart, labs and discussed the procedure including the risks, benefits and alternatives for the proposed anesthesia with the patient or authorized representative who has indicated his/her understanding and acceptance.     Dental advisory given  Plan Discussed with: CRNA and Anesthesiologist  Anesthesia Plan  Comments: (Patient refuses regional anesthesia for procedure. Discussed Risks/Benefits and alternatives GA vs RA. Will proceed with GETA.)       Anesthesia Quick Evaluation

## 2020-11-22 DIAGNOSIS — R42 Dizziness and giddiness: Secondary | ICD-10-CM | POA: Diagnosis not present

## 2020-11-22 DIAGNOSIS — R002 Palpitations: Secondary | ICD-10-CM | POA: Diagnosis not present

## 2020-12-03 ENCOUNTER — Telehealth: Payer: Self-pay | Admitting: *Deleted

## 2020-12-03 NOTE — Telephone Encounter (Signed)
From: Thurmon Fair, MD  Sent: 12/01/2020  11:46 AM EDT  Asa Lente, MD   Brief review off the monitor. Palpitations seem to be due to accelerated junctional rhythm, which is not a dangerous arrhythmia. Cause is uncertain. Her medications are unlikely to be the cause (lamotrigine reported to cause rhythm issues only with overdoses).  Will defer treatment to Dr. Antoine Poche. Copied Dr.Sater on this message.     pt aware of results

## 2020-12-14 ENCOUNTER — Telehealth: Payer: Self-pay | Admitting: Cardiology

## 2020-12-14 NOTE — Telephone Encounter (Signed)
Returned call to patient, made patient aware that appointment has been changed to MyChart Video visit. Advised patient of instructions for visit and patient verbalized understanding.

## 2020-12-14 NOTE — Telephone Encounter (Signed)
Patient called and wanted to know if her visit 12/18/20 could be done as a telemedicine appointment. Please advise

## 2020-12-14 NOTE — Telephone Encounter (Signed)
Returned call to patient who states she was calling to see if she could switch her appointment to Virtual visit on 8/12. Patient states she does have MyChart so she can do video visit. Advised that I would forward message to Dr. Antoine Poche for him to review and advise. Patient verbalized understanding.

## 2020-12-17 NOTE — Progress Notes (Signed)
Virtual Visit via Video Note   This visit type was conducted due to national recommendations for restrictions regarding the COVID-19 Pandemic (e.g. social distancing) in an effort to limit this patient's exposure and mitigate transmission in our community.  Due to her co-morbid illnesses, this patient is at least at moderate risk for complications without adequate follow up.  This format is felt to be most appropriate for this patient at this time.  All issues noted in this document were discussed and addressed.  A limited physical exam was performed with this format.  Please refer to the patient's chart for her consent to telehealth for [facility].       Date:  12/18/2020   ID:  Brooke [name], DOB 1986/04/23, MRN [id number] The patient was identified using 2 identifiers.  Patient Location: Home Provider Location: Office/Clinic   PCP:  Kandyce Rud, MD   [facility] Providers Cardiologist:  Rollene Rotunda, MD     Evaluation Performed:  Follow-Up Visit  Chief Complaint:  Palpitations  History of Present Illness:    Brooke [name] is a 34 y.o. female with palpitations.  I saw her when she was three months pregnant.  She wore an event monitor and had no significant arrhythmias.  There was an episode of junctional rhythm    She had a cerclage.    Since I saw her she has continued to have palpitations.  She says they are happening most days.  They are happening multiple times per day.  They last for 20 to 30 seconds.  She feels her heart skipping.  She cannot associate it with any particular foods or activities.  It happens sporadically.  She had 1 time it woke her from her sleep.  She has not had any frank syncope.  She otherwise is now [redacted] weeks pregnant and has no new symptoms such as chest discomfort, neck or arm discomfort.  She is otherwise progressing well in her pregnancy.  Of note she did trigger the event when she was having premature ventricular  contractions in a bigeminal pattern.  She also triggered when she was having junctional rhythms with rates in the high 90s.  There were no sustained bradycardia arrhythmias.  PVCs in a bigeminal pattern lasted for about 20 beats.  The patient does not have symptoms concerning for COVID-19 infection (fever, chills, cough, or new shortness of breath).    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   (06-15-2020  per pt no seizure since 05-19-2020)   neurologist--- dr Epimenio Foot--- pt previously seen by dr hickling/ dr Modesto Charon,  pt was referred to dr sater from ED visit / overnight stay for pt's first two generalized tonic clonic seizure's , pt had stated unable to sleep (second one was witnessed in ED);  per dr sater note in epic 06-09-2020 pt is back to baseline with intermittant myoclinic jerks with medication adjustement, normal  head CT 05-19-2020   PCOS (polycystic ovarian syndrome)    Prediabetes    Sleep apnea    Past Surgical History:  Procedure Laterality Date   CERVICAL CERCLAGE N/A 06/26/2014   Procedure: CERCLAGE CERVICAL;  Surgeon: Lenoard Aden, MD;  Location: [facility];  Service: Gynecology;  Laterality: N/A;   CERVICAL CERCLAGE N/A 11/17/2020   Procedure: McDonald CERVICAL CERCLAGE;  Surgeon: Olivia Mackie, MD;  Location: [facility];  Service: Gynecology;  Laterality: N/A;  EDD: 05/25/21   DILATATION & CURETTAGE/HYSTEROSCOPY WITH MYOSURE N/A 02/16/2017   Procedure: DILATATION & CURETTAGE/HYSTEROSCOPY WITH MYOSURE RESECTION OF  ENDOMETRIAL POLYP;  Surgeon: Olivia Mackie, MD;  Location: [facility];  Service: Gynecology;  Laterality: N/A;   DILATION AND EVACUATION N/A 06/17/2020   Procedure: DILATATION AND EVACUATION;  Surgeon: Olivia Mackie, MD;  Location: [facility] [place];  Service: Gynecology;  Laterality: N/A;   ROUX-EN-Y GASTRIC BYPASS  02-03-2020  @ [facility]   LAPAROSCOPIC   TONSILLECTOMY  CHILD   TYMPANOSTOMY TUBE PLACEMENT Bilateral child   WISDOM TOOTH EXTRACTION  age 30     Current Meds  Medication Sig   acetaminophen (TYLENOL) 500 MG tablet Take 500-1,000 mg by mouth every 8 (eight) hours as needed for moderate pain or headache.   calcium carbonate (TUMS - DOSED IN MG ELEMENTAL CALCIUM) 500 MG chewable tablet Chew 3 tablets by mouth daily.   lamoTRIgine (LAMICTAL) 150 MG tablet Take 1 tablet (150 mg total) by mouth 2 (two) times daily.   Magnesium 400 MG CAPS Take 400 mg by mouth daily in the afternoon.   Multiple Vitamin (MULTI VITAMIN PO) Take 1 tablet by mouth daily. Bariatric mutlti vitamin   pantoprazole (PROTONIX) 40 MG tablet Take 1 tablet (40 mg total) by mouth at bedtime.   propranolol (INDERAL) 10 MG tablet Take 1 tablet (10 mg total) by mouth 3 (three) times daily as needed (palpitations).     Allergies:   Patient has no known allergies.   Social History   Tobacco Use   Smoking status: Never   Smokeless tobacco: Never  Vaping Use   Vaping Use: Never used  Substance Use Topics   Alcohol use: No   Drug use: Never     Family Hx: The patient's family history includes Alcohol abuse in her maternal grandmother; Diabetes in her mother; Hearing loss in her paternal grandfather; Heart disease in her mother; Hypertension in her father and mother; Obesity in her father and mother.  ROS:   Please see the history of present illness.     All other systems reviewed and are negative.   Prior CV studies:   The following studies were reviewed today:  Event monitor  Labs/Other Tests and  Data Reviewed:    EKG:  No ECG reviewed.  Recent Labs: 07/30/2020: ALT 27; BUN 13; Creatinine, Ser 0.77; Magnesium 2.1; Potassium 4.5; Sodium 140; TSH 1.340 11/17/2020: Hemoglobin 13.8; Platelets 228   Recent Lipid Panel Lab Results  Component Value Date/Time   CHOL 151 07/30/2020 09:56 AM   TRIG 83 07/30/2020 09:56 AM   HDL 61 07/30/2020 09:56 AM   CHOLHDL 2.5 07/30/2020 09:56 AM   LDLCALC 74 07/30/2020 09:56 AM    Wt Readings from Last 3 Encounters:  12/18/20 156 lb (70.8 kg)  11/17/20 154 lb 9.6 oz (70.1 kg)  11/12/20 154 lb 9.6 oz (70.1 kg)     Risk Assessment/Calculations:          Objective:    Vital Signs:  BP 119/64   Pulse 62   Ht 5\' 4"  (1.626 m)   Wt 156 lb (70.8 kg)   LMP 08/18/2020   BMI 26.78 kg/m    VITAL SIGNS:  reviewed GEN:  no acute distress EYES:  sclerae anicteric, EOMI - Extraocular Movements Intact PSYCH:  normal affect  ASSESSMENT & PLAN:    PALPITATIONS: She continues to have palpitations some of this related to a slightly accelerated junctional rhythm as well as premature ventricular contractions.  I am going to give her propranolol 10 mg every 8 hours as needed.  I will be checking an echocardiogram.  She has had normal electrolytes and magnesium.  I have set her up to see Dr. 10/18/2020.       Time:   Today, I have spent 16 minutes with the patient with telehealth technology discussing the above problems.     Medication Adjustments/Labs and Tests Ordered: Current medicines are reviewed at length with the patient today.  Concerns regarding medicines are outlined above.   Tests Ordered: Orders Placed This Encounter  Procedures   ECHOCARDIOGRAM COMPLETE     Medication Changes: Meds ordered this encounter  Medications   propranolol (INDERAL) 10 MG tablet    Sig: Take 1 tablet (10 mg total) by mouth 3 (three) times daily as needed (palpitations).    Dispense:  30 tablet    Refill:  2     Follow Up:  Virtual Visit  with Dr.  Servando Salina  Signed, Servando Salina, MD  12/18/2020 11:04 AM    [facility]

## 2020-12-18 ENCOUNTER — Telehealth: Payer: Self-pay | Admitting: *Deleted

## 2020-12-18 ENCOUNTER — Encounter: Payer: Self-pay | Admitting: Cardiology

## 2020-12-18 ENCOUNTER — Telehealth (INDEPENDENT_AMBULATORY_CARE_PROVIDER_SITE_OTHER): Payer: 59 | Admitting: Cardiology

## 2020-12-18 VITALS — BP 119/64 | HR 62 | Ht 64.0 in | Wt 156.0 lb

## 2020-12-18 DIAGNOSIS — R002 Palpitations: Secondary | ICD-10-CM | POA: Diagnosis not present

## 2020-12-18 MED ORDER — PROPRANOLOL HCL 10 MG PO TABS: 10.0000 mg | ORAL_TABLET | Freq: Three times a day (TID) | ORAL | 2 refills | Status: DC | PRN

## 2020-12-18 NOTE — Telephone Encounter (Signed)
The patient has been called about the virtual appointment today with Dr. Antoine Poche. Instructions provided. The AVS will be mailed. The patient verbalized their understanding.    Patient Consent for Virtual Visit        Brooke [name] has provided verbal consent on 12/18/2020 for a virtual visit (video or telephone).   CONSENT FOR VIRTUAL VISIT FOR:  Brooke [name]  By participating in this virtual visit I agree to the following:  I hereby voluntarily request, consent and authorize [facility] and its employed or contracted physicians, physician assistants, nurse practitioners or other licensed health care professionals (the Practitioner), to provide me with telemedicine health care services (the "Services") as deemed necessary by the treating Practitioner. I acknowledge and consent to receive the Services by the Practitioner via telemedicine. I understand that the telemedicine visit will involve communicating with the Practitioner through live audiovisual communication technology and the disclosure of certain medical information by electronic transmission. I acknowledge that I have been given the opportunity to request an in-person assessment or other available alternative prior to the telemedicine visit and am voluntarily participating in the telemedicine visit.  I understand that I have the right to withhold or withdraw my consent to the use of telemedicine in the course of my care at any time, without affecting my right to future care or treatment, and that the Practitioner or I may terminate the telemedicine visit at any time. I understand that I have the right to inspect all information obtained and/or recorded in the course of the telemedicine visit and may receive copies of available information for a reasonable fee.  I understand that some of the potential risks of receiving the Services via telemedicine include:  Delay or interruption in medical evaluation due to  technological equipment failure or disruption; Information transmitted may not be sufficient (e.g. poor resolution of images) to allow for appropriate medical decision making by the Practitioner; and/or  In rare instances, security protocols could fail, causing a breach of personal health information.  Furthermore, I acknowledge that it is my responsibility to provide information about my medical history, conditions and care that is complete and accurate to the best of my ability. I acknowledge that Practitioner's advice, recommendations, and/or decision may be based on factors not within their control, such as incomplete or inaccurate data provided by me or distortions of diagnostic images or specimens that may result from electronic transmissions. I understand that the practice of medicine is not an exact science and that Practitioner makes no warranties or guarantees regarding treatment outcomes. I acknowledge that a copy of this consent can be made available to me via my patient portal [facility] MyChart), or I can request a printed copy by calling the office of [facility].    I understand that my insurance will be billed for this visit.   I have read or had this consent read to me. I understand the contents of this consent, which adequately explains the benefits and risks of the Services being provided via telemedicine.  I have been provided ample opportunity to ask questions regarding this consent and the Services and have had my questions answered to my satisfaction. I give my informed consent for the services to be provided through the use of telemedicine in my medical care

## 2020-12-18 NOTE — Patient Instructions (Addendum)
Medication Instructions:  START Propanolol 10 mg every 8 hours as needed  *If you need a refill on your cardiac medications before your next appointment, please call your pharmacy*   Lab Work: None ordered If you have labs (blood work) drawn today and your tests are completely normal, you will receive your results only by: MyChart Message (if you have MyChart) OR A paper copy in the mail If you have any lab test that is abnormal or we need to change your treatment, we will call you to review the results.   Testing/Procedures: Your physician has requested that you have an echocardiogram. Echocardiography is a painless test that uses sound waves to create images of your heart. It provides your doctor with information about the size and shape of your heart and how well your heart's chambers and valves are working. You may receive an ultrasound enhancing agent through an IV if needed to better visualize your heart during the echo.This procedure takes approximately one hour. There are no restrictions for this procedure. This will take place at the [address].   Follow-Up: At [facility], you and your health needs are our priority.  As part of our continuing mission to provide you with exceptional heart care, we have created designated Provider Care Teams.  These Care Teams include your primary Cardiologist (physician) and Advanced Practice Providers (APPs -  Physician Assistants and Nurse Practitioners) who all work together to provide you with the care you need, when you need it.  We recommend signing up for the patient portal called "MyChart".  Sign up information is provided on this After Visit Summary.  MyChart is used to connect with patients for Virtual Visits (Telemedicine).  Patients are able to view lab/test results, encounter notes, upcoming appointments, etc.  Non-urgent messages can be sent to your provider as well.   To learn more about what you can do with MyChart,  go to [email or web address].    Your next appointment:   Keep your appointment with Dr. Servando Salina on 9/23

## 2021-01-06 ENCOUNTER — Ambulatory Visit (HOSPITAL_COMMUNITY): Payer: 59 | Attending: Cardiovascular Disease

## 2021-01-06 ENCOUNTER — Other Ambulatory Visit: Payer: Self-pay

## 2021-01-06 DIAGNOSIS — R002 Palpitations: Secondary | ICD-10-CM

## 2021-01-06 LAB — ECHOCARDIOGRAM COMPLETE
Area-P 1/2: 2.17 cm2
S' Lateral: 2.8 cm

## 2021-01-13 ENCOUNTER — Ambulatory Visit (INDEPENDENT_AMBULATORY_CARE_PROVIDER_SITE_OTHER): Payer: 59 | Admitting: Family Medicine

## 2021-01-13 ENCOUNTER — Other Ambulatory Visit: Payer: Self-pay

## 2021-01-13 ENCOUNTER — Encounter (INDEPENDENT_AMBULATORY_CARE_PROVIDER_SITE_OTHER): Payer: Self-pay | Admitting: Family Medicine

## 2021-01-13 VITALS — BP 102/62 | HR 64 | Temp 98.1°F | Ht 64.0 in | Wt 158.0 lb

## 2021-01-13 DIAGNOSIS — R7301 Impaired fasting glucose: Secondary | ICD-10-CM | POA: Diagnosis not present

## 2021-01-13 DIAGNOSIS — R002 Palpitations: Secondary | ICD-10-CM

## 2021-01-13 DIAGNOSIS — E669 Obesity, unspecified: Secondary | ICD-10-CM

## 2021-01-13 DIAGNOSIS — E282 Polycystic ovarian syndrome: Secondary | ICD-10-CM | POA: Diagnosis not present

## 2021-01-13 DIAGNOSIS — R5383 Other fatigue: Secondary | ICD-10-CM | POA: Diagnosis not present

## 2021-01-13 DIAGNOSIS — Z3A21 21 weeks gestation of pregnancy: Secondary | ICD-10-CM

## 2021-01-13 DIAGNOSIS — Z9189 Other specified personal risk factors, not elsewhere classified: Secondary | ICD-10-CM | POA: Diagnosis not present

## 2021-01-13 DIAGNOSIS — Z6830 Body mass index (BMI) 30.0-30.9, adult: Secondary | ICD-10-CM

## 2021-01-13 DIAGNOSIS — E66811 Obesity, class 1: Secondary | ICD-10-CM

## 2021-01-13 DIAGNOSIS — Z9884 Bariatric surgery status: Secondary | ICD-10-CM

## 2021-01-13 MED ORDER — ACCU-CHEK GUIDE VI STRP: ORAL_STRIP | 0 refills | Status: DC

## 2021-01-13 MED ORDER — ACCU-CHEK FASTCLIX LANCETS MISC: 0 refills | Status: DC

## 2021-01-14 ENCOUNTER — Encounter (INDEPENDENT_AMBULATORY_CARE_PROVIDER_SITE_OTHER): Payer: Self-pay | Admitting: Family Medicine

## 2021-01-14 LAB — CBC WITH DIFFERENTIAL/PLATELET
Basophils Absolute: 0 10*3/uL (ref 0.0–0.2)
Basos: 0 %
EOS (ABSOLUTE): 0.1 10*3/uL (ref 0.0–0.4)
Eos: 1 %
Hemoglobin: 12.4 g/dL (ref 11.1–15.9)
Immature Grans (Abs): 0 10*3/uL (ref 0.0–0.1)
Immature Granulocytes: 1 %
Lymphocytes Absolute: 2.2 10*3/uL (ref 0.7–3.1)
Lymphs: 33 %
MCH: 32.4 pg (ref 26.6–33.0)
MCHC: 34.1 g/dL (ref 31.5–35.7)
MCV: 95 fL (ref 79–97)
Monocytes Absolute: 0.4 10*3/uL (ref 0.1–0.9)
Monocytes: 6 %
Neutrophils Absolute: 3.9 10*3/uL (ref 1.4–7.0)
Neutrophils: 59 %
Platelets: 272 10*3/uL (ref 150–450)
RBC: 3.83 x10E6/uL (ref 3.77–5.28)
RDW: 12.6 % (ref 11.7–15.4)
WBC: 6.5 10*3/uL (ref 3.4–10.8)

## 2021-01-14 LAB — COMPREHENSIVE METABOLIC PANEL
ALT: 13 IU/L (ref 0–32)
AST: 15 IU/L (ref 0–40)
Albumin/Globulin Ratio: 2 (ref 1.2–2.2)
Albumin: 4 g/dL (ref 3.8–4.8)
Alkaline Phosphatase: 43 IU/L — ABNORMAL LOW (ref 44–121)
BUN/Creatinine Ratio: 18 (ref 9–23)
BUN: 12 mg/dL (ref 6–20)
Bilirubin Total: <0.2 mg/dL (ref 0.0–1.2)
CO2: 21 mmol/L (ref 20–29)
Calcium: 9.1 mg/dL (ref 8.7–10.2)
Chloride: 103 mmol/L (ref 96–106)
Creatinine, Ser: 0.66 mg/dL (ref 0.57–1.00)
Globulin, Total: 2 g/dL (ref 1.5–4.5)
Glucose: 88 mg/dL (ref 65–99)
Potassium: 4.5 mmol/L (ref 3.5–5.2)
Sodium: 139 mmol/L (ref 134–144)
Total Protein: 6 g/dL (ref 6.0–8.5)
eGFR: 116 mL/min/{1.73_m2} (ref >59)

## 2021-01-14 LAB — TSH: TSH: 0.843 u[IU]/mL (ref 0.450–4.500)

## 2021-01-14 LAB — ANEMIA PANEL
Ferritin: 59 ng/mL (ref 15–150)
Folate, Hemolysate: 448 ng/mL
Folate, RBC: 1231 ng/mL (ref >498)
Hematocrit: 36.4 % (ref 34.0–46.6)
Iron Saturation: 25 % (ref 15–55)
Iron: 83 ug/dL (ref 27–159)
Retic Ct Pct: 2 % (ref 0.6–2.6)
Total Iron Binding Capacity: 333 ug/dL (ref 250–450)
UIBC: 250 ug/dL (ref 131–425)
Vitamin B-12: 1430 pg/mL — ABNORMAL HIGH (ref 232–1245)

## 2021-01-14 LAB — HEMOGLOBIN A1C
Est. average glucose Bld gHb Est-mCnc: 94 mg/dL
Hgb A1c MFr Bld: 4.9 % (ref 4.8–5.6)

## 2021-01-14 LAB — VITAMIN D 25 HYDROXY (VIT D DEFICIENCY, FRACTURES): Vit D, 25-Hydroxy: 42.9 ng/mL (ref 30.0–100.0)

## 2021-01-14 LAB — T4, FREE: Free T4: 1.2 ng/dL (ref 0.82–1.77)

## 2021-01-14 LAB — MAGNESIUM: Magnesium: 2 mg/dL (ref 1.6–2.3)

## 2021-01-14 NOTE — Telephone Encounter (Signed)
Last OV with Dr Wallace 

## 2021-01-14 NOTE — Telephone Encounter (Signed)
Dr.Wallace °

## 2021-01-18 NOTE — Progress Notes (Signed)
Chief Complaint:   OBESITY Brooke [name] is here to discuss her progress with her obesity treatment plan along with follow-up of her obesity related diagnoses.   Today's visit was #: 4 Starting weight: 166 lbs Starting date: 07/30/2020 Today's weight: 158 lbs Today's date: 01/13/2021 Weight change since last visit: 2 lbs Total lbs lost to date: 8 lbs Body mass index is 27.12 kg/m.  Total weight loss percentage to date: -4.82%  Current Meal Plan: practicing portion control and making smarter food choices, such as increasing vegetables and decreasing simple carbohydrates for 0% of the time.  Current Exercise Plan: None.  Interim History:  Brooke [name] is at [redacted] weeks gestation.  She is having a girl.    Assessment/Plan:   1. PCOS (polycystic ovarian syndrome) She will continue to focus on protein-rich, low simple carbohydrate foods. We reviewed the importance of hydration, regular exercise for stress reduction, and restorative sleep.   Plan:  Check labs today.  - Anemia panel - CBC with Differential/Platelet - Comprehensive metabolic panel - Hemoglobin A1c - VITAMIN D 25 Hydroxy (Vit-D Deficiency, Fractures) - TSH - T4, free - Magnesium  2. Other fatigue  - Anemia panel - CBC with Differential/Platelet - Comprehensive metabolic panel - Hemoglobin A1c - VITAMIN D 25 Hydroxy (Vit-D Deficiency, Fractures) - TSH - T4, free - Magnesium  3. Fasting hyperglycemia  - glucose blood (ACCU-CHEK GUIDE) test strip; Use to check blood sugar daily  Dispense: 100 each; Refill: 0 - Accu-Chek FastClix Lancets MISC; Use to check blood sugar daily  Dispense: 102 each; Refill: 0  4. [redacted] weeks gestation of pregnancy, high risk, with cerclage  5. Palpitations, followed by Cardiology Labs ordered today.   6. History of Roux-en-Y gastric bypass At risk for malabsorption and hypoglycemia.  - Anemia panel - CBC with Differential/Platelet - Comprehensive metabolic panel - Hemoglobin A1c -  VITAMIN D 25 Hydroxy (Vit-D Deficiency, Fractures) - TSH - T4, free - Magnesium  7. At risk for complication associated with hypotension Brooke [name] was given approximately 8 minutes of education and counseling today regarding the condition of hypotension, the pathophysiology of the condition and concerns regarding prevention and treatment of this condition.  We discussed risks of medications used to treat hypertension, which in the setting of weight loss, can inadvertently cause hypotension.    8. Obesity, current BMI 27.3  Course: Brooke [name] is currently in the action stage of change. As such, her goal is to continue with weight loss efforts.   Nutrition goals: She has agreed to practicing portion control and making smarter food choices, such as increasing vegetables and decreasing simple carbohydrates.   Exercise goals:  As is.  Behavioral modification strategies: increasing lean protein intake, decreasing simple carbohydrates, increasing vegetables, increasing water intake, and increasing high fiber foods.  Brooke [name] has agreed to follow-up with our clinic in 4 weeks. She was informed of the importance of frequent follow-up visits to maximize her success with intensive lifestyle modifications for her multiple health conditions.   Objective:   Blood pressure 102/62, pulse 64, temperature 98.1 F (36.7 C), temperature source Oral, height 5\' 4"  (1.626 m), weight 158 lb (71.7 kg), last menstrual period 08/18/2020, SpO2 97 %, unknown if currently breastfeeding. Body mass index is 27.12 kg/m.  General: Cooperative, alert, well developed, in no acute distress. HEENT: Conjunctivae and lids unremarkable. Cardiovascular: Regular rhythm.  Lungs: Normal work of breathing. Neurologic: No focal deficits.   Lab Results  Component Value Date   CREATININE 0.66 01/13/2021  BUN 12 01/13/2021   NA 139 01/13/2021   K 4.5 01/13/2021   CL 103 01/13/2021   CO2 21 01/13/2021   Lab Results  Component  Value Date   ALT 13 01/13/2021   AST 15 01/13/2021   ALKPHOS 43 (L) 01/13/2021   BILITOT <0.2 01/13/2021   Lab Results  Component Value Date   HGBA1C 4.9 01/13/2021   HGBA1C 5.0 07/30/2020   Lab Results  Component Value Date   INSULIN 5.4 07/30/2020   Lab Results  Component Value Date   TSH 0.843 01/13/2021   Lab Results  Component Value Date   CHOL 151 07/30/2020   HDL 61 07/30/2020   LDLCALC 74 07/30/2020   TRIG 83 07/30/2020   CHOLHDL 2.5 07/30/2020   Lab Results  Component Value Date   VD25OH 42.9 01/13/2021   VD25OH 43.3 07/30/2020   Lab Results  Component Value Date   WBC 6.5 01/13/2021   HGB 12.4 01/13/2021   HCT 36.4 01/13/2021   MCV 95 01/13/2021   PLT 272 01/13/2021   Lab Results  Component Value Date   IRON 83 01/13/2021   TIBC 333 01/13/2021   FERRITIN 59 01/13/2021   Attestation Statements:   Reviewed by clinician on day of visit: allergies, medications, problem list, medical history, surgical history, family history, social history, and previous encounter notes.  I, Insurance claims handler, CMA, am acting as transcriptionist for Helane Rima, DO  I have reviewed the above documentation for accuracy and completeness, and I agree with the above. Helane Rima, DO

## 2021-01-29 ENCOUNTER — Ambulatory Visit: Payer: 59 | Admitting: Cardiology

## 2021-02-01 ENCOUNTER — Ambulatory Visit (INDEPENDENT_AMBULATORY_CARE_PROVIDER_SITE_OTHER): Payer: 59 | Admitting: Family Medicine

## 2021-03-11 ENCOUNTER — Encounter: Payer: Self-pay | Admitting: Family Medicine

## 2021-03-11 ENCOUNTER — Ambulatory Visit (INDEPENDENT_AMBULATORY_CARE_PROVIDER_SITE_OTHER): Payer: 59 | Admitting: Family Medicine

## 2021-03-11 VITALS — BP 117/74 | HR 68 | Ht 64.0 in | Wt 170.5 lb

## 2021-03-11 DIAGNOSIS — F419 Anxiety disorder, unspecified: Secondary | ICD-10-CM | POA: Diagnosis not present

## 2021-03-11 DIAGNOSIS — F518 Other sleep disorders not due to a substance or known physiological condition: Secondary | ICD-10-CM

## 2021-03-11 DIAGNOSIS — Z3490 Encounter for supervision of normal pregnancy, unspecified, unspecified trimester: Secondary | ICD-10-CM

## 2021-03-11 DIAGNOSIS — G40B09 Juvenile myoclonic epilepsy, not intractable, without status epilepticus: Secondary | ICD-10-CM | POA: Diagnosis not present

## 2021-03-11 NOTE — Patient Instructions (Addendum)
Below is our plan:  We will continue lamotrigine 150mg  twice daily. I will talk to Dr regarding levetiracetam but I am reassured you are doing well on lamotrigine. Continue close follow up with OB.   Please make sure you are staying well hydrated. I recommend 50-60 ounces daily. Well balanced diet and regular exercise encouraged. Consistent sleep schedule with 6-8 hours recommended.   Please continue follow up with care team as directed.   Follow up with Sater in 6 months   You may receive a survey regarding today's visit. I encourage you to leave honest feed back as I do use this information to improve patient care. Thank you for seeing me today!

## 2021-03-11 NOTE — Progress Notes (Signed)
Chief Complaint  Patient presents with   Follow-up    RM 2, alone. Seizure f/u. No seizures, doing well. OB rx'd buspar but feels ineffective. Placed on low dose while pregnant. Wanting to discuss here.      HISTORY OF PRESENT ILLNESS:  03/11/21 ALL:  Brooke [name] is a 35 y.o. female here today for follow up for juvenile myoclonic epilepsy. She continues lamotrigine. Dose was increased to 150mg  BID due to reports of more jerking in the setting of recent pregnancy and abrupt discontinuation of lorazepam. Since, she reports significant improvement. She is not taking levetiracetam. Unsure why and not sure how long she has been off medication. She thinks she stopped it when she found out she was pregnant. She did tolerate it well. She is now [redacted] weeks pregnant with a baby girl. She has cervical incompetence with cerclage. She is monitored weekly by OB. Buspar 5mg  is not very helpful for anxiety.   HISTORY (copied from Dr Garth Bigness previous note)  I had the pleasure of seeing your patient, Brooke [name], at [facility] for neurologic consultation regarding her epilepsy.   Update 09/08/2020: She has not had any more GTC seizures.    She is on lamotrigine 100 mg po bid and Keppra 750 bid.    She has no more morning myoclonus since the lamotrigine was increased.     She had bariatric surgery (was 270 pounds - now 165lbs) and had low albumin when I last saw her.  She was referred to nutrition and her weight is stable and she is eating better.      She has some anxiety.   Lorazepam in the past had helped anxiety.      She has migraine headaches.   They have generally done well since her son is back in school.    She is now sleeping 9-10 hours because she gets sleepy at 7 pm and goes to bed at wakes up to get son    When I saw her at the last visit, she was pregnant.  She had an early miscarriage shortly after that visit..   Seizure history She was diagnosed  with juvenile myoclonic epilepsy at age 6.   At that time, she had episodes of myoclonus, especially in the mornings.  She noted that going back to sleep would help.   She saw Dr. Gaynell Face and was placed on Depakote but gained weight and lost hair.  On a combination of lamotrigine and Depakote, she did better with decent tolerability and no further hair loss.   At some point, she was switched to Topamax which helped the best (no myoclonus) but caused tingling and altered taste.    Myoclonus always did better if she slept better.  She had a flurry of myoclonic jerks associated with hr son's birth 6 years ago  She never had a definite absence seizure.   However, Two years ago, she had a possible absence event.   She had a conversation at work but does not recall several minutes.       Her first generalized tonic clonic seizure was 05/19/2020.   She was unable to sleep due to feeling very cold.  She decided to take a warm bath.  While getting the tub ready, she started convulsing.   The husband who was in different part of the house heard the noise and ran in and saw her convulsing for about 1 minute.    He got her on the  bathroom floor.   He called 911 after she stopped seizing.   THis was about 1030 am. She went to the ED.   she was conversant after the seizure but she does not recall that period of time.  About noon, in the ED, she had a second GTC seizure.   She was started on Keppra and lamotrigine.   She does not recall the rest of that day and has spotty memory of the next few days.     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      Studies: CT scan 05/19/2020 was normal.   Actual images were reviewed and I concur.  Labs showed low normal Mg and mildly low Calcium and albumin.     EEG 10/29/2014 showed 1-3 second bursts of generalized irregular 3-4 Hz spike and polyspike and wave discharges with frontal predominance.   REVIEW OF SYSTEMS: Out of a  complete 14 system review of symptoms, the patient complains only of the following symptoms, myoclonic jerks and all other reviewed systems are negative.   ALLERGIES: No Known Allergies   HOME MEDICATIONS: Outpatient Medications Prior to Visit  Medication Sig Dispense Refill   Accu-Chek FastClix Lancets MISC Use to check blood sugar daily 102 each 0   acetaminophen (TYLENOL) 500 MG tablet Take 500-1,000 mg by mouth every 8 (eight) hours as needed for moderate pain or headache.     BUSPIRONE HCL PO Take 5 mg by mouth 2 (two) times daily.     calcium carbonate (TUMS - DOSED IN MG ELEMENTAL CALCIUM) 500 MG chewable tablet Chew 3 tablets by mouth daily.     glucose blood (ACCU-CHEK GUIDE) test strip Use to check blood sugar daily 100 each 0   lamoTRIgine (LAMICTAL) 150 MG tablet Take 1 tablet (150 mg total) by mouth 2 (two) times daily. 60 tablet 11   Magnesium 400 MG CAPS Take 400 mg by mouth daily in the afternoon.     Multiple Vitamin (MULTI VITAMIN PO) Take 1 tablet by mouth daily. Bariatric mutlti vitamin     pantoprazole (PROTONIX) 40 MG tablet Take 1 tablet (40 mg total) by mouth at bedtime. 90 tablet 3   No facility-administered medications prior to visit.     PAST MEDICAL HISTORY: Past Medical History:  Diagnosis Date   Alopecia    wears hair topper -   Anxiety    Arthritis    Back pain    Depression    Epilepsy (HCC)    GAD (generalized anxiety disorder)    GERD (gastroesophageal reflux disease)    History of concussion age 48   per pt hit in head w/ softball , forehead area,  no loc had multiple stitches   Hypertension    Infertility, female    MDD (major depressive disorder)    Migraines    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 44   (06-15-2020  per pt no seizure since 05-19-2020)   neurologist--- dr Epimenio Foot--- pt previously seen by dr hickling/ dr Modesto Charon,  pt was referred to dr sater from ED visit / overnight stay for pt's  first two generalized tonic clonic seizure's , pt had stated unable to sleep (second one was witnessed in ED);  per dr sater note in epic 06-09-2020 pt is back to baseline with intermittant myoclinic jerks with medication adjustement, normal head CT 05-19-2020   PCOS (polycystic ovarian syndrome)    Prediabetes  Sleep apnea      PAST SURGICAL HISTORY: Past Surgical History:  Procedure Laterality Date   CERVICAL CERCLAGE N/A 06/26/2014   Procedure: CERCLAGE CERVICAL;  Surgeon: Lovenia Kim, MD;  Location: [facility];  Service: Gynecology;  Laterality: N/A;   CERVICAL CERCLAGE N/A 11/17/2020   Procedure: [place] CERVICAL CERCLAGE;  Surgeon: Brien Few, MD;  Location: [facility];  Service: Gynecology;  Laterality: N/A;  EDD: 05/25/21   DILATATION & CURETTAGE/HYSTEROSCOPY WITH MYOSURE N/A 02/16/2017   Procedure: DILATATION & CURETTAGE/HYSTEROSCOPY WITH MYOSURE RESECTION OF ENDOMETRIAL POLYP;  Surgeon: Brien Few, MD;  Location: [facility];  Service: Gynecology;  Laterality: N/A;   DILATION AND EVACUATION N/A 06/17/2020   Procedure: DILATATION AND EVACUATION;  Surgeon: Brien Few, MD;  Location: [facility];  Service: Gynecology;  Laterality: N/A;   ROUX-EN-Y GASTRIC BYPASS  02-03-2020  @ [facility]   LAPAROSCOPIC   TONSILLECTOMY  CHILD   TYMPANOSTOMY TUBE PLACEMENT Bilateral child   WISDOM TOOTH EXTRACTION  age 29     FAMILY HISTORY: Family History  Problem Relation Age of Onset   Diabetes Mother    Hypertension Mother    Heart disease Mother    Obesity Mother    Hypertension Father    Obesity Father    Alcohol abuse Maternal Grandmother    Hearing loss Paternal Grandfather      SOCIAL HISTORY: Social History   Socioeconomic History   Marital status: Married    Spouse name: Not on file   Number of children: 1   Years of education: Not on file   Highest education level: Not on file  Occupational History   Occupation: Stay at home mother  Tobacco Use    Smoking status: Never   Smokeless tobacco: Never  Vaping Use   Vaping Use: Never used  Substance and Sexual Activity   Alcohol use: No   Drug use: Never   Sexual activity: Yes    Partners: Male    Birth control/protection: None  Other Topics Concern   Not on file  Social History Narrative   Married: no kids   Regular exercise: not at this time   Caffeine use: no   Lives with husband   Social Determinants of Radio broadcast assistant Strain: Not on file  Food Insecurity: Not on file  Transportation Needs: Not on file  Physical Activity: Not on file  Stress: Not on file  Social Connections: Not on file  Intimate Partner Violence: Not on file     PHYSICAL EXAM  Vitals:   03/11/21 1048  BP: 117/74  Pulse: 68  Weight: 170 lb 8 oz (77.3 kg)  Height: 5\' 4"  (1.626 m)   Body mass index is 29.27 kg/m.  Generalized: Well developed, in no acute distress  Cardiology: normal rate and rhythm, no murmur auscultated  Respiratory: clear to auscultation bilaterally    Neurological examination  Mentation: Alert oriented to time, place, history taking. Follows all commands speech and language fluent Cranial nerve II-XII: Pupils were equal round reactive to light. Extraocular movements were full, visual field were full on confrontational test. Facial sensation and strength were normal. Head turning and shoulder shrug  were normal and symmetric. Motor: The motor testing reveals 5 over 5 strength of all 4 extremities. Good symmetric motor tone is noted throughout.  Gait and station: Gait is normal.    DIAGNOSTIC DATA (LABS, IMAGING, TESTING) - I reviewed patient records, labs, notes, testing and imaging myself where available.  Lab Results  Component Value Date   WBC 6.5 01/13/2021   HGB 12.4 01/13/2021   HCT 36.4 01/13/2021   MCV 95 01/13/2021   PLT 272 01/13/2021      Component Value Date/Time   NA 139 01/13/2021 1604   K 4.5 01/13/2021 1604   CL 103 01/13/2021 1604    CO2 21 01/13/2021 1604   GLUCOSE 88 01/13/2021 1604   GLUCOSE 80 05/20/2020 0435   BUN 12 01/13/2021 1604   CREATININE 0.66 01/13/2021 1604   CALCIUM 9.1 01/13/2021 1604   PROT 6.0 01/13/2021 1604   ALBUMIN 4.0 01/13/2021 1604   AST 15 01/13/2021 1604   ALT 13 01/13/2021 1604   ALKPHOS 43 (L) 01/13/2021 1604   BILITOT <0.2 01/13/2021 1604   GFRNONAA >60 05/20/2020 0435   GFRAA >60 09/26/2014 1234   Lab Results  Component Value Date   CHOL 151 07/30/2020   HDL 61 07/30/2020   LDLCALC 74 07/30/2020   TRIG 83 07/30/2020   CHOLHDL 2.5 07/30/2020   Lab Results  Component Value Date   HGBA1C 4.9 01/13/2021   Lab Results  Component Value Date   VITAMINB12 1,430 (H) 01/13/2021   Lab Results  Component Value Date   TSH 0.843 01/13/2021    No flowsheet data found.   No flowsheet data found.   ASSESSMENT AND PLAN  35 y.o. year old female  has a past medical history of Alopecia, Anxiety, Arthritis, Back pain, Depression, Epilepsy ([place]), GAD (generalized anxiety disorder), GERD (gastroesophageal reflux disease), History of concussion (age 51), Hypertension, Infertility, female, MDD (major depressive disorder), Migraines, Nonintractable juvenile myoclonic epilepsy without status epilepticus ([place]) (age 4   (06-15-2020  per pt no seizure since 05-19-2020)), PCOS (polycystic ovarian syndrome), Prediabetes, and Sleep apnea. here with    Nonintractable juvenile myoclonic epilepsy without status epilepticus ([place])  Hypnic jerks  Anxiety  Pregnancy, unspecified gestational age  We will check lamotrigine level, today. She will continue 150mg  BID. Will discuss levetiracetam with Dr Felecia Shelling. She does not wish to restart it at this time but wishes to have it with her during delivery as she reports significant worsening of myoclonic jerks with delivery of her son 6 years ago. Healthy lifestyle habits encouraged. She will discuss buspirone dose with OB. I will have her check in with Dr  Felecia Shelling in 6 months.    No orders of the defined types were placed in this encounter.    No orders of the defined types were placed in this encounter.     Debbora Presto, MSN, FNP-C 03/11/2021, 11:14 AM  [facility] [address], [place], [place] [address] [phone]

## 2021-03-12 ENCOUNTER — Other Ambulatory Visit (INDEPENDENT_AMBULATORY_CARE_PROVIDER_SITE_OTHER): Payer: Self-pay | Admitting: Family Medicine

## 2021-03-12 DIAGNOSIS — R7301 Impaired fasting glucose: Secondary | ICD-10-CM

## 2021-03-15 LAB — LAMOTRIGINE LEVEL: Lamotrigine Lvl: 3.5 ug/mL (ref 2.0–20.0)

## 2021-03-15 NOTE — Telephone Encounter (Signed)
Last OV with Dr Wallace 

## 2021-03-22 ENCOUNTER — Encounter: Payer: Self-pay | Admitting: *Deleted

## 2021-05-09 NOTE — L&D Delivery Note (Signed)
Delivery Note At 12:15 PM a viable and healthy female was delivered via  (Presentation: ROA     ).  APGAR:8 ,9 ; weight pending .   Placenta status:  spontaneous, intact .  Cord:   with the following complications:  none.  Cord pH: na  Anesthesia: Epidural Episiotomy:  na Lacerations:  left periurethral Suture Repair: 3.0 vicryl rapide Est. Blood Loss (mL):  300  Mom to postpartum.  Baby to Couplet care / Skin to Skin.  Cyan Moultrie J 05/18/2021, 12:23 PM

## 2021-05-11 ENCOUNTER — Encounter: Payer: Self-pay | Admitting: Neurology

## 2021-05-12 ENCOUNTER — Other Ambulatory Visit: Payer: Self-pay | Admitting: Family Medicine

## 2021-05-12 MED ORDER — LORAZEPAM 1 MG PO TABS: 1.0000 mg | ORAL_TABLET | Freq: Every day | ORAL | 1 refills | Status: DC | PRN

## 2021-05-12 NOTE — Progress Notes (Signed)
Lorazepam 1mg  daily a needed, lamotrigine 150mg  BID

## 2021-05-14 ENCOUNTER — Other Ambulatory Visit: Payer: Self-pay | Admitting: Obstetrics and Gynecology

## 2021-05-17 ENCOUNTER — Other Ambulatory Visit: Payer: Self-pay

## 2021-05-18 ENCOUNTER — Inpatient Hospital Stay (HOSPITAL_COMMUNITY): Payer: 59 | Admitting: Anesthesiology

## 2021-05-18 ENCOUNTER — Encounter (HOSPITAL_COMMUNITY): Payer: Self-pay | Admitting: Obstetrics and Gynecology

## 2021-05-18 ENCOUNTER — Inpatient Hospital Stay (HOSPITAL_COMMUNITY): Payer: 59

## 2021-05-18 ENCOUNTER — Inpatient Hospital Stay (HOSPITAL_COMMUNITY)
Admission: AD | Admit: 2021-05-18 | Discharge: 2021-05-19 | DRG: 806 | Disposition: A | Discharge status: Home / Self Care | Payer: 59 | Attending: Obstetrics and Gynecology | Admitting: Obstetrics and Gynecology

## 2021-05-18 DIAGNOSIS — O99824 Streptococcus B carrier state complicating childbirth: Secondary | ICD-10-CM | POA: Diagnosis present

## 2021-05-18 DIAGNOSIS — Z20822 Contact with and (suspected) exposure to covid-19: Secondary | ICD-10-CM | POA: Diagnosis present

## 2021-05-18 DIAGNOSIS — Z3490 Encounter for supervision of normal pregnancy, unspecified, unspecified trimester: Secondary | ICD-10-CM | POA: Diagnosis present

## 2021-05-18 DIAGNOSIS — Z3A39 39 weeks gestation of pregnancy: Secondary | ICD-10-CM

## 2021-05-18 DIAGNOSIS — O26893 Other specified pregnancy related conditions, third trimester: Secondary | ICD-10-CM | POA: Diagnosis present

## 2021-05-18 DIAGNOSIS — O99844 Bariatric surgery status complicating childbirth: Secondary | ICD-10-CM | POA: Diagnosis present

## 2021-05-18 DIAGNOSIS — G40B09 Juvenile myoclonic epilepsy, not intractable, without status epilepticus: Secondary | ICD-10-CM | POA: Diagnosis present

## 2021-05-18 DIAGNOSIS — O99344 Other mental disorders complicating childbirth: Secondary | ICD-10-CM | POA: Diagnosis present

## 2021-05-18 DIAGNOSIS — F419 Anxiety disorder, unspecified: Secondary | ICD-10-CM | POA: Diagnosis present

## 2021-05-18 DIAGNOSIS — O99354 Diseases of the nervous system complicating childbirth: Principal | ICD-10-CM | POA: Diagnosis present

## 2021-05-18 LAB — RPR: RPR Ser Ql: NONREACTIVE

## 2021-05-18 LAB — CBC
HCT: 35.3 % — ABNORMAL LOW (ref 36.0–46.0)
Hemoglobin: 11.7 g/dL — ABNORMAL LOW (ref 12.0–15.0)
MCH: 30.2 pg (ref 26.0–34.0)
MCHC: 33.1 g/dL (ref 30.0–36.0)
MCV: 91 fL (ref 80.0–100.0)
Platelets: 244 10*3/uL (ref 150–400)
RBC: 3.88 MIL/uL (ref 3.87–5.11)
RDW: 11.9 % (ref 11.5–15.5)
WBC: 6.7 10*3/uL (ref 4.0–10.5)
nRBC: 0 % (ref 0.0–0.2)

## 2021-05-18 LAB — RESP PANEL BY RT-PCR (FLU A&B, COVID) ARPGX2
Influenza A by PCR: NEGATIVE
Influenza B by PCR: NEGATIVE
SARS Coronavirus 2 by RT PCR: NEGATIVE

## 2021-05-18 LAB — TYPE AND SCREEN
ABO/RH(D): B POS
Antibody Screen: NEGATIVE

## 2021-05-18 MED ORDER — TETANUS-DIPHTH-ACELL PERTUSSIS 5-2.5-18.5 LF-MCG/0.5 IM SUSY: 0.5000 mL | PREFILLED_SYRINGE | Freq: Once | INTRAMUSCULAR | Status: DC

## 2021-05-18 MED ORDER — LORAZEPAM 1 MG PO TABS
1.0000 mg | ORAL_TABLET | Freq: Once | ORAL | Status: AC
  Administered 2021-05-18: 1 mg via ORAL
  Filled 2021-05-18: qty 1

## 2021-05-18 MED ORDER — BUSPIRONE HCL 5 MG PO TABS
5.0000 mg | ORAL_TABLET | Freq: Two times a day (BID) | ORAL | Status: DC
  Administered 2021-05-18 – 2021-05-19 (×3): 5 mg via ORAL
  Filled 2021-05-18 (×4): qty 1

## 2021-05-18 MED ORDER — OXYTOCIN BOLUS FROM INFUSION: 333.0000 mL | Freq: Once | INTRAVENOUS | Status: DC

## 2021-05-18 MED ORDER — SIMETHICONE 80 MG PO CHEW: 80.0000 mg | CHEWABLE_TABLET | ORAL | Status: DC | PRN

## 2021-05-18 MED ORDER — FENTANYL CITRATE (PF) 100 MCG/2ML IJ SOLN: 100.0000 ug | INTRAMUSCULAR | Status: DC | PRN

## 2021-05-18 MED ORDER — OXYTOCIN-SODIUM CHLORIDE 30-0.9 UT/500ML-% IV SOLN
1.0000 m[IU]/min | INTRAVENOUS | Status: DC
  Administered 2021-05-18: 2 m[IU]/min via INTRAVENOUS
  Filled 2021-05-18: qty 500

## 2021-05-18 MED ORDER — ONDANSETRON HCL 4 MG/2ML IJ SOLN: 4.0000 mg | Freq: Four times a day (QID) | INTRAMUSCULAR | Status: DC | PRN

## 2021-05-18 MED ORDER — COCONUT OIL OIL
1.0000 "application " | TOPICAL_OIL | Status: DC | PRN
  Administered 2021-05-19: 1 via TOPICAL

## 2021-05-18 MED ORDER — ACETAMINOPHEN 325 MG PO TABS
650.0000 mg | ORAL_TABLET | ORAL | Status: DC | PRN
  Administered 2021-05-18 – 2021-05-19 (×3): 650 mg via ORAL
  Filled 2021-05-18 (×5): qty 2

## 2021-05-18 MED ORDER — PRENATAL MULTIVITAMIN CH
1.0000 | ORAL_TABLET | Freq: Every day | ORAL | Status: DC
  Administered 2021-05-19: 1 via ORAL
  Filled 2021-05-18: qty 1

## 2021-05-18 MED ORDER — PHENYLEPHRINE 40 MCG/ML (10ML) SYRINGE FOR IV PUSH (FOR BLOOD PRESSURE SUPPORT): 80.0000 ug | PREFILLED_SYRINGE | INTRAVENOUS | Status: DC | PRN

## 2021-05-18 MED ORDER — LACTATED RINGERS IV SOLN: 500.0000 mL | INTRAVENOUS | Status: DC | PRN

## 2021-05-18 MED ORDER — ZOLPIDEM TARTRATE 5 MG PO TABS: 5.0000 mg | ORAL_TABLET | Freq: Every evening | ORAL | Status: DC | PRN

## 2021-05-18 MED ORDER — LIDOCAINE HCL (PF) 1 % IJ SOLN: 30.0000 mL | INTRAMUSCULAR | Status: DC | PRN

## 2021-05-18 MED ORDER — ACETAMINOPHEN 325 MG PO TABS
650.0000 mg | ORAL_TABLET | ORAL | Status: DC | PRN
  Administered 2021-05-18: 650 mg via ORAL
  Filled 2021-05-18: qty 2

## 2021-05-18 MED ORDER — SOD CITRATE-CITRIC ACID 500-334 MG/5ML PO SOLN: 30.0000 mL | ORAL | Status: DC | PRN

## 2021-05-18 MED ORDER — ONDANSETRON HCL 4 MG/2ML IJ SOLN: 4.0000 mg | INTRAMUSCULAR | Status: DC | PRN

## 2021-05-18 MED ORDER — SENNOSIDES-DOCUSATE SODIUM 8.6-50 MG PO TABS
2.0000 | ORAL_TABLET | Freq: Every day | ORAL | Status: DC
  Administered 2021-05-19: 2 via ORAL
  Filled 2021-05-18: qty 2

## 2021-05-18 MED ORDER — FENTANYL-BUPIVACAINE-NACL 0.5-0.125-0.9 MG/250ML-% EP SOLN
12.0000 mL/h | EPIDURAL | Status: DC | PRN
  Administered 2021-05-18: 12 mL/h via EPIDURAL
  Filled 2021-05-18: qty 250

## 2021-05-18 MED ORDER — LAMOTRIGINE 100 MG PO TABS: 200.0000 mg | ORAL_TABLET | Freq: Two times a day (BID) | ORAL | Status: DC

## 2021-05-18 MED ORDER — OXYTOCIN-SODIUM CHLORIDE 30-0.9 UT/500ML-% IV SOLN: 2.5000 [IU]/h | INTRAVENOUS | Status: DC

## 2021-05-18 MED ORDER — LIDOCAINE HCL (PF) 1 % IJ SOLN
INTRAMUSCULAR | Status: DC | PRN
  Administered 2021-05-18: 8 mL via EPIDURAL

## 2021-05-18 MED ORDER — MISOPROSTOL 25 MCG QUARTER TABLET: 25.0000 ug | ORAL_TABLET | ORAL | Status: DC | PRN

## 2021-05-18 MED ORDER — SODIUM CHLORIDE 0.9 % IV SOLN
5.0000 10*6.[IU] | Freq: Once | INTRAVENOUS | Status: AC
  Administered 2021-05-18: 5 10*6.[IU] via INTRAVENOUS
  Filled 2021-05-18: qty 5

## 2021-05-18 MED ORDER — DIBUCAINE (PERIANAL) 1 % EX OINT: 1.0000 "application " | TOPICAL_OINTMENT | CUTANEOUS | Status: DC | PRN

## 2021-05-18 MED ORDER — LAMOTRIGINE 150 MG PO TABS
150.0000 mg | ORAL_TABLET | Freq: Two times a day (BID) | ORAL | Status: DC
  Administered 2021-05-18 – 2021-05-19 (×2): 150 mg via ORAL
  Filled 2021-05-18 (×3): qty 1

## 2021-05-18 MED ORDER — ONDANSETRON HCL 4 MG PO TABS: 4.0000 mg | ORAL_TABLET | ORAL | Status: DC | PRN

## 2021-05-18 MED ORDER — TERBUTALINE SULFATE 1 MG/ML IJ SOLN: 0.2500 mg | Freq: Once | INTRAMUSCULAR | Status: DC | PRN

## 2021-05-18 MED ORDER — METHYLERGONOVINE MALEATE 0.2 MG/ML IJ SOLN: 0.2000 mg | INTRAMUSCULAR | Status: DC | PRN

## 2021-05-18 MED ORDER — LAMOTRIGINE 150 MG PO TABS
150.0000 mg | ORAL_TABLET | Freq: Two times a day (BID) | ORAL | Status: DC
  Administered 2021-05-18: 150 mg via ORAL
  Filled 2021-05-18: qty 1

## 2021-05-18 MED ORDER — OXYCODONE-ACETAMINOPHEN 5-325 MG PO TABS: 2.0000 | ORAL_TABLET | ORAL | Status: DC | PRN

## 2021-05-18 MED ORDER — METHYLERGONOVINE MALEATE 0.2 MG PO TABS: 0.2000 mg | ORAL_TABLET | ORAL | Status: DC | PRN

## 2021-05-18 MED ORDER — OXYCODONE-ACETAMINOPHEN 5-325 MG PO TABS
1.0000 | ORAL_TABLET | ORAL | Status: DC | PRN
  Administered 2021-05-19: 1 via ORAL
  Filled 2021-05-18: qty 1

## 2021-05-18 MED ORDER — DIPHENHYDRAMINE HCL 50 MG/ML IJ SOLN: 12.5000 mg | INTRAMUSCULAR | Status: DC | PRN

## 2021-05-18 MED ORDER — LACTATED RINGERS IV SOLN: INTRAVENOUS | Status: DC

## 2021-05-18 MED ORDER — DIPHENHYDRAMINE HCL 25 MG PO CAPS
25.0000 mg | ORAL_CAPSULE | Freq: Four times a day (QID) | ORAL | Status: DC | PRN
  Administered 2021-05-18: 25 mg via ORAL
  Filled 2021-05-18: qty 1

## 2021-05-18 MED ORDER — LACTATED RINGERS IV SOLN
500.0000 mL | Freq: Once | INTRAVENOUS | Status: AC
  Administered 2021-05-18: 500 mL via INTRAVENOUS

## 2021-05-18 MED ORDER — WITCH HAZEL-GLYCERIN EX PADS: 1.0000 "application " | MEDICATED_PAD | CUTANEOUS | Status: DC | PRN

## 2021-05-18 MED ORDER — LORAZEPAM 0.5 MG PO TABS: 1.0000 mg | ORAL_TABLET | Freq: Every day | ORAL | Status: DC

## 2021-05-18 MED ORDER — BENZOCAINE-MENTHOL 20-0.5 % EX AERO
1.0000 "application " | INHALATION_SPRAY | CUTANEOUS | Status: DC | PRN
  Administered 2021-05-18: 1 via TOPICAL
  Filled 2021-05-18: qty 56

## 2021-05-18 MED ORDER — EPHEDRINE 5 MG/ML INJ: 10.0000 mg | INTRAVENOUS | Status: DC | PRN

## 2021-05-18 MED ORDER — LORAZEPAM 1 MG PO TABS
1.0000 mg | ORAL_TABLET | Freq: Every day | ORAL | Status: DC
  Administered 2021-05-18: 1 mg via ORAL
  Filled 2021-05-18: qty 2

## 2021-05-18 MED ORDER — BUPIVACAINE HCL (PF) 0.25 % IJ SOLN
INTRAMUSCULAR | Status: DC | PRN
  Administered 2021-05-18: 8 mL via EPIDURAL

## 2021-05-18 MED ORDER — PENICILLIN G POT IN DEXTROSE 60000 UNIT/ML IV SOLN
3.0000 10*6.[IU] | INTRAVENOUS | Status: DC
  Administered 2021-05-18 (×2): 3 10*6.[IU] via INTRAVENOUS
  Filled 2021-05-18 (×2): qty 50

## 2021-05-18 NOTE — Anesthesia Preprocedure Evaluation (Addendum)
Anesthesia Evaluation  Patient identified by MRN, date of birth, ID band Patient awake    Reviewed: Allergy & Precautions, NPO status , Patient's Chart, lab work & pertinent test results  Airway Mallampati: II  TM Distance: >3 FB Neck ROM: Full    Dental no notable dental hx.    Pulmonary asthma , sleep apnea ,    Pulmonary exam normal breath sounds clear to auscultation       Cardiovascular hypertension, negative cardio ROS Normal cardiovascular exam Rhythm:Regular Rate:Normal     Neuro/Psych  Headaches, Seizures - (last seizure 05/2020; also has myoclonic jerks),  PSYCHIATRIC DISORDERS Anxiety Depression    GI/Hepatic Neg liver ROS, GERD  ,  Endo/Other  negative endocrine ROS  Renal/GU negative Renal ROS  negative genitourinary   Musculoskeletal  (+) Arthritis ,   Abdominal   Peds negative pediatric ROS (+)  Hematology negative hematology ROS (+)   Anesthesia Other Findings   Reproductive/Obstetrics (+) Pregnancy                            Anesthesia Physical Anesthesia Plan  ASA: 3  Anesthesia Plan: Epidural   Post-op Pain Management:    Induction:   PONV Risk Score and Plan: 2 and Treatment may vary due to age or medical condition  Airway Management Planned: Natural Airway  Additional Equipment: None  Intra-op Plan:   Post-operative Plan:   Informed Consent: I have reviewed the patients History and Physical, chart, labs and discussed the procedure including the risks, benefits and alternatives for the proposed anesthesia with the patient or authorized representative who has indicated his/her understanding and acceptance.       Plan Discussed with: Anesthesiologist  Anesthesia Plan Comments:         Anesthesia Quick Evaluation

## 2021-05-18 NOTE — Lactation Note (Signed)
This note was copied from a baby's chart. Lactation Consultation Note Mom states baby is latching well for 20 minutes on the breast then she is giving her colostrum that she has in ref as supplement. Mom states baby seems satisfied. Mom has 38 yr old son that was preemie 34 weeks and he wouldn't latch. Mom stated her breast got hard but she could only get 1 oz between both of them. Not enough for feedings. Gave mom hand pump d/t she doesn't have her DEBP yet. Noted mom also has PCOS. Asked mom if she would like to use DEBP while here for breast stimulation to induce milk supply. Mom stated she doesn't want to pump every 3 hrs but she will do it PRN as she wants.  Mom encouraged to feed baby 8-12 times/24 hours and with feeding cues.  Mom shown how to use DEBP & how to disassemble, clean, & reassemble parts.  Answered questions. Encouraged mom to call for [facility] for next feeding for latch assistance. Lactation brochure given.  Patient Name: Brooke [name] [id number] Date: 05/18/2021 Reason for consult: Initial assessment;Term;1st time breastfeeding Age:63 hours  Maternal Data    Feeding    LATCH Score       Type of Nipple: Everted at rest and after stimulation  Comfort (Breast/Nipple): Soft / non-tender         Lactation Tools Discussed/Used Tools: Pump Breast pump type: Manual;Double-Electric Breast Pump Pump Education: Setup, frequency, and cleaning;Milk Storage Reason for Pumping: PCOS hx: of low milk supply Pumping frequency: PRN (mom doesn't want to pump q3hr. just PRN)  Interventions    Discharge    Consult Status Consult Status: Follow-up Date: 05/19/21 Follow-up type: In-patient    Charyl Dancer 05/18/2021, 9:14 PM

## 2021-05-18 NOTE — Progress Notes (Signed)
RN contacted CNM reporting patient experiencing myoclonic movements x 2 since being on postpartum floor. Reports that nursing has placed bed pads for seizure precautions. Patient with extensive history of seizure disorder, currently taking Lamictal 150mg  BID and Ativan prn.  One time order for Ativan 1mg  given. Consult with Dr. . Will order neurology consult. RN to contact CNM or MD if additional seizure activity is witnessed or reported.   , MSN, CNM 05/18/2021, 8:10 PM

## 2021-05-18 NOTE — Progress Notes (Signed)
Brooke [name] is a 36 y.o. [id number] at [redacted]w[redacted]d by LMP admitted for induction of labor due to SZ disorder, advanced dilatation and GBS pos .  Subjective: Comfortable  Objective: BP 129/78    Pulse 75    Temp 98.4 F (36.9 C) (Oral)    Resp 18    Ht 5\' 4"  (1.626 m)    Wt 82.9 kg    LMP 08/18/2020    BMI 31.36 kg/m  No intake/output data recorded. No intake/output data recorded.  FHT:  FHR: 155 bpm, variability: moderate,  accelerations:  Present,  decelerations:  Absent UC:   irregular, every 3-5 minutes SVE:   8-9/90/0  Labs: Lab Results  Component Value Date   WBC 6.7 05/18/2021   HGB 11.7 (L) 05/18/2021   HCT 35.3 (L) 05/18/2021   MCV 91.0 05/18/2021   PLT 244 05/18/2021    Assessment / Plan: Induction of labor due to above indications,  progressing well on pitocin GBS positive  Labor: Progressing normally Preeclampsia:  no signs or symptoms of toxicity Fetal Wellbeing:  Category I Pain Control:  Labor support without medications I/D:  n/a Anticipated MOD:  NSVD  Davetta Olliff J 05/18/2021, 11:32 AM

## 2021-05-18 NOTE — Progress Notes (Signed)
Brooke [name] is a 36 y.o. [id number] at [redacted]w[redacted]d by LMP admitted for induction of labor due to SZ disorder, advanced dilatation and GBS pos .  Subjective: Feeling contractions  Objective: BP (!) 112/56    Pulse 74    Temp 98.4 F (36.9 C) (Oral)    Resp 18    Ht 5\' 4"  (1.626 m)    Wt 82.9 kg    LMP 08/18/2020    BMI 31.36 kg/m  No intake/output data recorded. No intake/output data recorded.  FHT:  FHR: 155 bpm, variability: moderate,  accelerations:  Present,  decelerations:  Absent UC:   irregular, every 3-5 minutes SVE:   4-5/70/-2 ArOM-clear  Labs: Lab Results  Component Value Date   WBC 6.7 05/18/2021   HGB 11.7 (L) 05/18/2021   HCT 35.3 (L) 05/18/2021   MCV 91.0 05/18/2021   PLT 244 05/18/2021    Assessment / Plan: Induction of labor due to above indications,  progressing well on pitocin GBS positive  Labor: Progressing normally Preeclampsia:  no signs or symptoms of toxicity Fetal Wellbeing:  Category I Pain Control:  Labor support without medications I/D:  n/a Anticipated MOD:  NSVD  Bauer Ausborn J 05/18/2021, 9:08 AM

## 2021-05-18 NOTE — Progress Notes (Signed)
This Rn  at pts bedside. PT revealed that she started having myoclonic jerks around 1640. Pt did not call out to nurses station at that time. FOB got into bed with PT to hold her. It settles the pt. Pt slept and was without breathing problems. Dr. Billy Coast paged and Kathlyn Sacramento called for orders. Pt was placed on seizure precautions with bed siderails pads applied. Pt alert and without seizure activity at this time.

## 2021-05-18 NOTE — Anesthesia Procedure Notes (Signed)
Epidural Patient location during procedure: OB Start time: 05/18/2021 9:22 AM End time: 05/18/2021 9:32 AM  Staffing Anesthesiologist: Merlinda Frederick, MD Performed: anesthesiologist   Preanesthetic Checklist Completed: patient identified, IV checked, site marked, risks and benefits discussed, monitors and equipment checked, pre-op evaluation and timeout performed  Epidural Patient position: sitting Prep: DuraPrep Patient monitoring: heart rate, cardiac monitor, continuous pulse ox and blood pressure Approach: midline Location: L2-L3 Injection technique: LOR saline  Needle:  Needle type: Tuohy  Needle gauge: 17 G Needle length: 9 cm Needle insertion depth: 6 cm Catheter type: closed end flexible Catheter size: 20 Guage Catheter at skin depth: 11 cm Test dose: negative and Other  Assessment Events: blood not aspirated, injection not painful, no injection resistance and negative IV test  Additional Notes Informed consent obtained prior to proceeding including risk of failure, 1% risk of PDPH, risk of minor discomfort and bruising.  Discussed rare but serious complications including epidural abscess, permanent nerve injury, epidural hematoma.  Discussed alternatives to epidural analgesia and patient desires to proceed.  Timeout performed pre-procedure verifying patient name, procedure, and platelet count.  Patient tolerated procedure well.

## 2021-05-18 NOTE — Progress Notes (Signed)
Upon fundal massage, pt passed a tangerine sized blood clot. Post clot, bleeding was wnl,fundus firm and at umbilicus

## 2021-05-18 NOTE — H&P (Signed)
Brooke [name] is a 36 y.o. female presenting for IOL due to advanced dilatation at term.History of Sz disorder. 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[place]) age 89   (06-15-2020  per pt no seizure since 05-19-2020)   neurologist--- dr Felecia Shelling--- pt previously seen by dr hickling/ dr Jacelyn Grip,  pt was referred to dr sater from ED visit / overnight stay for pt's first two generalized tonic clonic seizure's , pt had stated unable to sleep (second one was witnessed in ED);  per dr sater note in epic 06-09-2020 pt is back to baseline with intermittant myoclinic jerks with medication adjustement, normal head CT 05-19-2020   PCOS (polycystic ovarian syndrome)    Prediabetes    Sleep apnea    Past Surgical History:  Procedure Laterality Date   CERVICAL CERCLAGE N/A 06/26/2014   Procedure: CERCLAGE CERVICAL;  Surgeon: Lovenia Kim, MD;  Location: [facility];  Service: Gynecology;  Laterality: N/A;   CERVICAL CERCLAGE N/A 11/17/2020   Procedure: [place] CERVICAL CERCLAGE;  Surgeon: Brien Few, MD;  Location: [facility];  Service: Gynecology;  Laterality: N/A;  EDD: 05/25/21   DILATATION & CURETTAGE/HYSTEROSCOPY WITH MYOSURE N/A 02/16/2017   Procedure: DILATATION & CURETTAGE/HYSTEROSCOPY WITH MYOSURE RESECTION OF ENDOMETRIAL POLYP;  Surgeon: Brien Few, MD;   Location: [facility];  Service: Gynecology;  Laterality: N/A;   DILATION AND EVACUATION N/A 06/17/2020   Procedure: DILATATION AND EVACUATION;  Surgeon: Brien Few, MD;  Location: [facility];  Service: Gynecology;  Laterality: N/A;   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 Social History:  reports that she has never smoked. She has never used smokeless tobacco. She reports that she does not drink alcohol and does not use drugs.     Maternal Diabetes: No Genetic Screening: Normal Maternal Ultrasounds/Referrals: Normal Fetal Ultrasounds or other Referrals:  None Maternal Substance Abuse:  No Significant Maternal Medications:  Lamictal, Lorazepam Significant Maternal Lab Results:  Group B Strep positive Other Comments:  None  Review of Systems  Constitutional: Negative.   All other systems reviewed and are negative. Maternal Medical History:  Reason for admission: Contractions.   Contractions: Onset was less than 1 hour ago.   Frequency: rare.   Perceived severity is mild.   Fetal activity: Perceived fetal activity is normal.   Last perceived fetal movement was within the past hour.   Prenatal complications: no prenatal complications Prenatal Complications - Diabetes: none.  Dilation: 4 Effacement (%): 60 Station: -3 Exam by:: Sidonie Dickens, RN Blood pressure 122/66, pulse 71, temperature 98.4 F (36.9 C), temperature source Oral, resp. rate 18, height 5\' 4"  (1.626 m), weight 82.9 kg, last menstrual period 08/18/2020, unknown if currently breastfeeding. Maternal Exam:  Uterine Assessment:  Contraction strength is mild.  Contraction frequency is irregular.  Abdomen: Patient reports no abdominal tenderness. Fetal presentation: vertex Introitus: Normal vulva. Normal vagina.  Ferning test: not done.  Nitrazine test: not done. Amniotic fluid character: not assessed. Pelvis: adequate for delivery.   Cervix: Cervix evaluated by digital exam.    Physical Exam Constitutional:      Appearance: Normal appearance. She is normal weight.  HENT:     Head: Normocephalic and atraumatic.  Cardiovascular:     Rate and Rhythm: Normal rate and regular rhythm.  Pulmonary:     Effort: Pulmonary effort is normal.     Breath sounds: Normal breath sounds.  Abdominal:     General: Bowel sounds are normal.     Palpations: Abdomen is soft.  Genitourinary:    General: Normal vulva.  Musculoskeletal:        General: Normal range of motion.     Cervical back: Normal range of motion and neck supple.  Skin:    General: Skin is warm and dry.  Neurological:     General: No focal deficit present.     Mental Status: She is alert and oriented to person, place, and time.  Psychiatric:        Mood and Affect: Mood normal.        Behavior: Behavior normal.    Prenatal labs: ABO, Rh: --/--/B POS (01/10 0017) Antibody: NEG (01/10 0017) Rubella: Immune (06/22 0000) RPR: Nonreactive (06/22 0000)  HBsAg: Negative (06/22 0000)  HIV: Non-reactive (06/22 0000)  GBS: Positive/-- (07/11 0000)   Assessment/Plan: 39wk IUP History of cerclage SZ disorder on Lamictal GBS pos Advanced dilatation IOL   Brooke [name] J 05/18/2021, 6:32 AM

## 2021-05-18 NOTE — Lactation Note (Signed)
This note was copied from a baby's chart. Lactation Consultation Note  Patient Name: Brooke [name] [id number] Date: 05/18/2021 Reason for consult: L&D Initial assessment;Term;Maternal endocrine disorder;Breastfeeding assistance;Other (Comment) Age:38 hours As LC entered the room grandmother holding baby and baby showing signs of hunger. Per mom the baby fed 20 mins on both breast. Per mom feeling very tired and requested baby to be spoon fed some colostrum she brought from home.  While thawing , baby was rooting, mom decided to relatch and baby fed 15 mins with swallows. Latch score 8  Spoon fed 4 ml after breast feeding.  Mom aware she will have another LC visit later today.   Maternal Data Does the patient have breastfeeding experience prior to this delivery?: Yes How long did the patient breastfeed?: per mom 1st baby was in NICU 41 weeker / would never really latch , pumped for 1 month only ever getting 30 ml  Feeding Mother's Current Feeding Choice: Breast Milk and Formula  LATCH Score Latch: Grasps breast easily, tongue down, lips flanged, rhythmical sucking.  Audible Swallowing: A few with stimulation  Type of Nipple: Everted at rest and after stimulation  Comfort (Breast/Nipple): Soft / non-tender  Hold (Positioning): Assistance needed to correctly position infant at breast and maintain latch.  LATCH Score: 8   Lactation Tools Discussed/Used    Interventions Interventions: Breast feeding basics reviewed;Skin to skin;Assisted with latch;Support pillows  Discharge    Consult Status Consult Status: Follow-up from L&D Date: 05/18/21 Follow-up type: In-patient    Brooke [name] 05/18/2021, 1:57 PM

## 2021-05-19 ENCOUNTER — Telehealth: Payer: Self-pay | Admitting: Neurology

## 2021-05-19 ENCOUNTER — Encounter (HOSPITAL_COMMUNITY): Payer: Self-pay | Admitting: Obstetrics and Gynecology

## 2021-05-19 LAB — CBC
HCT: 33.5 % — ABNORMAL LOW (ref 36.0–46.0)
Hemoglobin: 11.2 g/dL — ABNORMAL LOW (ref 12.0–15.0)
MCH: 30.4 pg (ref 26.0–34.0)
MCHC: 33.4 g/dL (ref 30.0–36.0)
MCV: 90.8 fL (ref 80.0–100.0)
Platelets: 246 10*3/uL (ref 150–400)
RBC: 3.69 MIL/uL — ABNORMAL LOW (ref 3.87–5.11)
RDW: 11.8 % (ref 11.5–15.5)
WBC: 9.5 10*3/uL (ref 4.0–10.5)
nRBC: 0 % (ref 0.0–0.2)

## 2021-05-19 MED ORDER — BENZOCAINE-MENTHOL 20-0.5 % EX AERO: 1.0000 "application " | INHALATION_SPRAY | CUTANEOUS | Status: DC | PRN

## 2021-05-19 MED ORDER — OXYCODONE HCL 5 MG PO TABS: 5.0000 mg | ORAL_TABLET | Freq: Four times a day (QID) | ORAL | 0 refills | Status: AC | PRN

## 2021-05-19 MED ORDER — ACETAMINOPHEN 500 MG PO TABS: 1000.0000 mg | ORAL_TABLET | Freq: Four times a day (QID) | ORAL | 2 refills | Status: AC | PRN

## 2021-05-19 MED ORDER — SENNOSIDES-DOCUSATE SODIUM 8.6-50 MG PO TABS: 2.0000 | ORAL_TABLET | Freq: Every day | ORAL | Status: DC

## 2021-05-19 MED ORDER — IBUPROFEN 600 MG PO TABS: 600.0000 mg | ORAL_TABLET | Freq: Four times a day (QID) | ORAL | Status: DC

## 2021-05-19 MED ORDER — BUSPIRONE HCL 5 MG PO TABS
5.0000 mg | ORAL_TABLET | Freq: Two times a day (BID) | ORAL | 1 refills | Status: DC
Start: 2021-05-19 — End: 2022-03-24

## 2021-05-19 MED ORDER — COCONUT OIL OIL: 1.0000 "application " | TOPICAL_OIL | 0 refills | Status: DC | PRN

## 2021-05-19 NOTE — Telephone Encounter (Signed)
Pt called states she gave birth on yesterday, however she has been having seizures since delivering. Pt states hospital will not release her until she has an appt with Dr. Felecia Shelling tomorrow. She said it can be virtual.

## 2021-05-19 NOTE — Social Work (Signed)
MOB was referred for history of depression and anxiety.  ° °* Referral screened out by Clinical Social Worker because none of the following criteria appear to apply: ° °~ History of anxiety/depression during this pregnancy, or of post-partum depression following prior delivery. °~ Diagnosis of anxiety and/or depression within last 3 years. °OR °* MOB's symptoms currently being treated with medication and/or therapy. Per prenatal records, MOB currently prescribed Buspar.  ° °Please contact the Clinical Social Worker if needs arise, by MOB request, or if MOB scores greater than 9/yes to question 10 on Edinburgh Postpartum Depression Screen. ° °Brooke [name], LCSWA °Clinical Social Work °[facility]  °[phone]  °

## 2021-05-19 NOTE — Progress Notes (Signed)
Patient requesting to go home today and states she wants to cancel neurology consult . Patient states she wants to follow up with her neurologist after discharge .

## 2021-05-19 NOTE — Anesthesia Postprocedure Evaluation (Signed)
Anesthesia Post Note  Patient: Brooke [name]  Procedure(s) Performed: AN AD HOC LABOR EPIDURAL     Patient location during evaluation: Mother Baby Anesthesia Type: Epidural Level of consciousness: awake Pain management: satisfactory to patient Vital Signs Assessment: post-procedure vital signs reviewed and stable Respiratory status: spontaneous breathing Cardiovascular status: stable Anesthetic complications: no   No notable events documented.  Last Vitals:  Vitals:   05/18/21 2350 05/19/21 0526  BP: 124/73 116/80  Pulse: 72 65  Resp: 16   Temp: 36.8 C 36.8 C  SpO2: 98%     Last Pain:  Vitals:   05/19/21 0100  TempSrc:   PainSc: 1    Pain Goal:                   [employer]

## 2021-05-19 NOTE — Telephone Encounter (Signed)
Called and offered a 9:30 am apt with Dr Epimenio Foot. Pt accepted.

## 2021-05-19 NOTE — Discharge Instructions (Signed)
Lactation outpatient support - home visit ° ° °Jessica Bowers, IBCLC (lactation consultant)  & Birth Doula ° °Phone (text or call): [phone] °Email: [email or web address] °[email or web address] ° ° °Linda Coppola °RN, MHA, IBCLC °at [facility]: Lactation Consultant ° °[email or web address] °Mail: [email or web address] °Tel: [phone] ° ° °Additional breastfeeding resources: ° °[facility] °https://ibconline.ca/information-sheets/ ° °[employer] [place] ° °www.lllofnc.org ° ° °Other Resources: ° °Chiropractic specialist  ° °Dr. Leanna Hastings °[email or web address] ° ° °Craniosacral therapy for baby ° °Erin Balkind  °[email or web address]://[email or web address] ° °

## 2021-05-19 NOTE — Progress Notes (Signed)
Call placed to Kathryne Gin CNM to relay patient request to cancel neurology consult and discharge home. Patient stated she will book appointment ,as requested ,with her neurologist for follow up tomorrow .

## 2021-05-19 NOTE — Progress Notes (Addendum)
PPD # 1 S/P NSVD  Live born female  Birth Weight: 7 lb 10.4 oz (3470 g) APGAR: 9, 9  Newborn Delivery   Birth date/time: 05/18/2023 12:15:00 Delivery type: Vaginal, Spontaneous     Baby name: Soreign Delivering provider: Olivia Mackie  Episiotomy:   Lacerations:Periurethral   Feeding: breast and bottle  Pain control at delivery: Epidural   S:  Reports feeling well, one episode of seizure after delivery yesterday, was able to rest well overnight.             Tolerating po/ No nausea or vomiting             Bleeding is light             Pain controlled with acetaminophen             Up ad lib / ambulatory / voiding without difficulties   O:  A & O x 3, in no apparent distress              VS:  Vitals:   05/18/21 1540 05/18/21 1945 05/18/21 2350 05/19/21 0526  BP: 130/79 117/66 124/73 116/80  Pulse: 88 68 72 65  Resp: 16 16 16    Temp: 98 F (36.7 C) 98.4 F (36.9 C) 98.2 F (36.8 C) 98.2 F (36.8 C)  TempSrc: Oral Oral Oral   SpO2: 98% 98% 98%   Weight:      Height:        LABS:  Recent Labs    05/18/21 0017 05/19/21 0513  WBC 6.7 9.5  HGB 11.7* 11.2*  HCT 35.3* 33.5*  PLT 244 246    Blood type: --/--/B POS (01/10 0017)  Rubella: Immune (06/22 0000)   I&O: I/O last 3 completed shifts: In: -  Out: 300 [Blood:300]          No intake/output data recorded.   Gen: AAO x 3, NAD  Abdomen: soft, non-tender, non-distended             Fundus: firm, non-tender, U-1  Perineum: no edema  Lochia: small  Extremities: no edema, no calf pain or tenderness    A/P: PPD # 1 35 y.o., 10-29-1973   Principal Problem:   Postpartum care following vaginal delivery 1/10 Active Problems:   Juvenile myoclonic epilepsy (HCC)  - currently on Lamictal 150 mg and Ativan PRN  - increased seizure activity during pregnancy  - neuro consult pending  - patient reports better control with Topamax. This is an L3 lactation risk, limited data risk but probably compatible  Alternatively [id number] is L2 lactation risk, compatible, still with high plasma transmission to newborn but probably compatible. Patient did not like Kepra used in the past d/t more side effects.    Anxiety  - stopped BuSpar 1 month ago, did not feel was effective, resumed inpatient  - monitor closely for PPD Hx gastric bypass, Roux[facility]-Y 2021 at [facility]  - No ibuprofen use   Encounter for induction of labor   SVD 1/10   Doing well - stable status  Routine post partum orders  Anticipate discharge tomorrow    [facility], MSN, CNM 05/19/2021, 10:31 AM     Addendum:  Patient desires DC this PM, does not want to wait any longer for hospitalist neuro consult and prefers to see her own provider. Has appointment scheduled tomorrow 9:30 AM with Dr. 07/17/2021. Reports feeling well, no further seizure activity since yesterday's episode.  VSS  Will DC home with  precautions POC in consult w/ Dr. Amado Nash.   Neta Mends, MSN, CNM 05/19/2021, 5:09 PM

## 2021-05-19 NOTE — Discharge Summary (Signed)
OB Discharge Summary  Patient Name: Brooke [name] DOB: 09/22/1985 MRN: [id number]  Date of admission: 05/18/2021 Delivering provider: Olivia Mackie   Admitting diagnosis: Encounter for induction of labor [Z34.90] Intrauterine pregnancy: [redacted]w[redacted]d     Secondary diagnosis: Patient Active Problem List   Diagnosis Date Noted   Encounter for induction of labor 05/18/2021   SVD 1/10 05/18/2021   Postpartum care following vaginal delivery 1/10 05/18/2021   Palpitations 11/11/2020   Dizziness 11/11/2020   Hypnic jerks 09/08/2020   Bariatric surgery status 09/08/2020   Anxiety 09/08/2020   PCOS (polycystic ovarian syndrome) 08/13/2020   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   Elevated LFTs 05/19/2020   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   Essential hypertension 05/08/2007   Asthma 05/08/2007   G E R D 05/08/2007   Additional problems:none   Date of discharge: 05/19/2021   Discharge diagnosis: Principal Problem:   Postpartum care following vaginal delivery 1/10 Active Problems:   Juvenile myoclonic epilepsy (HCC)   Anxiety   Encounter for induction of labor   SVD 1/10                                                              Post partum procedures: none  Augmentation: AROM and Pitocin Pain control: Epidural  Laceration:Periurethral  Episiotomy:  Complications: postpartum seizure/hx seizure disorder  Hospital course:  Induction of Labor With Vaginal Delivery   36 y.o. yo [id number] at [redacted]w[redacted]d was admitted to the hospital 05/18/2021 for induction of labor.  Indication for induction:  advanced dilation and history of seizure disorder .  Patient had an uncomplicated labor course as follows: Membrane Rupture Time/Date: 8:45 AM ,05/18/2021   Delivery Method:Vaginal, Spontaneous  Episiotomy:   Lacerations:  Periurethral  Details of delivery can be found in separate delivery note.   Patient had a postpartum course complicated by seizure activity immediately postpartum, treated with one dose of Ativan, remained seizure free thereafter. Initially offered inpatient neuro consult but opted to discharge home on postpartum day 1 evening and follow up with established neurologist the next morning. Patient is discharged home 05/19/21.Close follow up in office in 2 weeks.   Newborn Data: Birth date:05/18/2021  Birth time:12:15 PM  Gender:Female  Living status:Living  Apgars:9 ,9  Weight:3470 g   Physical exam  Vitals:   05/18/21 1945 05/18/21 2350 05/19/21 0526 05/19/21 1612  BP: 117/66 124/73 116/80 115/71  Pulse: 68 72 65 62  Resp: 16 16  18   Temp: 98.4 F (36.9 C) 98.2 F (36.8 C) 98.2 F (36.8 C) 98.1 F (36.7 C)  TempSrc: Oral Oral  Axillary  SpO2: 98% 98%  99%  Weight:      Height:       General: alert, cooperative, and no distress Lochia: appropriate Uterine Fundus: firm Incision: N/A Perineum: repair intact, no edema DVT Evaluation: No significant calf/ankle edema. Labs: Lab Results  Component Value Date   WBC 9.5 05/19/2021   HGB 11.2 (L) 05/19/2021   HCT 33.5 (L) 05/19/2021   MCV 90.8 05/19/2021   PLT 246 05/19/2021   CMP Latest Ref Rng & Units 01/13/2021  Glucose 65 - 99 mg/dL 88  BUN 6 - 20 mg/dL 12  Creatinine 03/15/2021 -  1.00 mg/dL 1.610.66  Sodium 096134 - 045144 mmol/L 139  Potassium 3.5 - 5.2 mmol/L 4.5  Chloride 96 - 106 mmol/L 103  CO2 20 - 29 mmol/L 21  Calcium 8.7 - 10.2 mg/dL 9.1  Total Protein 6.0 - 8.5 g/dL 6.0  Total Bilirubin 0.0 - 1.2 mg/dL <4.0<0.2  Alkaline Phos 44 - 121 IU/L 43(L)  AST 0 - 40 IU/L 15  ALT 0 - 32 IU/L 13   Edinburgh Postnatal Depression Scale Screening Tool 05/19/2021  I have been able to laugh and see the funny side of things. 0  I have looked forward with enjoyment to things. 0  I have blamed myself unnecessarily when things went wrong. 0  I have been anxious or worried for no good reason. 3  I have felt scared or  panicky for no good reason. 1  Things have been getting on top of me. 0  I have been so unhappy that I have had difficulty sleeping. 0  I have felt sad or miserable. 0  I have been so unhappy that I have been crying. 0  The thought of harming myself has occurred to me. 0  Edinburgh Postnatal Depression Scale Total 4   Discharge instruction:  per After Visit Summary,  [facility] booklet and  "Understanding Mother & Baby Care" hospital booklet  After Visit Meds:  Allergies as of 05/19/2021       Reactions   Nsaids    Post gastric bypass        Medication List     TAKE these medications    Accu-Chek FastClix Lancets Misc Use to check blood sugar daily   Accu-Chek Guide test strip Generic drug: glucose blood Use to check blood sugar daily   acetaminophen 500 MG tablet Commonly known as: TYLENOL Take 2 tablets (1,000 mg total) by mouth every 6 (six) hours as needed. What changed:  how much to take when to take this reasons to take this   benzocaine-Menthol 20-0.5 % Aero Commonly known as: DERMOPLAST Apply 1 application topically as needed for irritation (perineal discomfort).   busPIRone 5 MG tablet Commonly known as: BUSPAR Take 1-2 tablets (5-10 mg total) by mouth 2 (two) times daily. What changed:  medication strength how much to take   calcium carbonate 500 MG chewable tablet Commonly known as: TUMS - dosed in mg elemental calcium Chew 3 tablets by mouth daily.   coconut oil Oil Apply 1 application topically as needed.   lamoTRIgine 150 MG tablet Commonly known as: LaMICtal Take 1 tablet (150 mg total) by mouth 2 (two) times daily.   LORazepam 1 MG tablet Commonly known as: ATIVAN Take 1 tablet (1 mg total) by mouth daily as needed for anxiety (for myoclonus).   Magnesium 400 MG Caps Take 400 mg by mouth daily in the afternoon.   MULTI VITAMIN PO Take 1 tablet by mouth daily. Bariatric mutlti vitamin   oxyCODONE 5 MG immediate release  tablet Commonly known as: Oxy IR/ROXICODONE Take 1 tablet (5 mg total) by mouth every 6 (six) hours as needed for up to 3 days for moderate pain.   pantoprazole 40 MG tablet Commonly known as: PROTONIX Take 1 tablet (40 mg total) by mouth at bedtime.               Discharge Care Instructions  (From admission, onward)           Start     Ordered   05/19/21 0000  Discharge wound  care:       Comments: Sitz baths 2 times /day with warm water x 1 week. May add herbals: 1 ounce dried comfrey leaf* 1 ounce calendula flowers 1 ounce lavender flowers  Supplies can be found online at [employer] sources at [employer], [employer]  1/2 ounce dried uva ursi leaves 1/2 ounce witch hazel blossoms (if you can find them) 1/2 ounce dried sage leaf 1/2 cup sea salt Directions: Bring 2 quarts of water to a boil. Turn off heat, and place 1 ounce (approximately 1 large handful) of the above mixed herbs (not the salt) into the pot. Steep, covered, for 30 minutes.  Strain the liquid well with a fine mesh strainer, and discard the herb material. Add 2 quarts of liquid to the tub, along with the 1/2 cup of salt. This medicinal liquid can also be made into compresses and peri-rinses.   05/19/21 1713            Diet: routine diet  Activity: Advance as tolerated. Pelvic rest for 6 weeks.   Postpartum contraception: TBA in office  Newborn Data: Live born female  Birth Weight: 7 lb 10.4 oz (3470 g) APGAR: 9, 9  Newborn Delivery   Birth date/time: 05/18/2021 12:15:00 Delivery type: Vaginal, Spontaneous      named S[place] Baby Feeding: Bottle and Breast Disposition:home with mother  Delivery Report:  Review the Delivery Report for details.    Follow up:  Follow-up Information     Olivia Mackie, MD. Schedule an appointment as soon as possible for a visit in 2 week(s).   Specialty: Obstetrics and Gynecology Why: For Postpartum follow-up Contact  information: [address] [place] [address] [phone]                   Signed: Neta Mends, CNM, MSN 05/19/2021, 5:14 PM

## 2021-05-20 ENCOUNTER — Encounter: Payer: Self-pay | Admitting: Neurology

## 2021-05-20 ENCOUNTER — Ambulatory Visit: Payer: 59 | Admitting: Neurology

## 2021-05-20 VITALS — BP 122/70 | HR 85 | Ht 64.0 in | Wt 172.0 lb

## 2021-05-20 DIAGNOSIS — G40B09 Juvenile myoclonic epilepsy, not intractable, without status epilepticus: Secondary | ICD-10-CM

## 2021-05-20 DIAGNOSIS — Z9884 Bariatric surgery status: Secondary | ICD-10-CM | POA: Diagnosis not present

## 2021-05-20 DIAGNOSIS — F419 Anxiety disorder, unspecified: Secondary | ICD-10-CM

## 2021-05-20 DIAGNOSIS — F518 Other sleep disorders not due to a substance or known physiological condition: Secondary | ICD-10-CM

## 2021-05-20 NOTE — Progress Notes (Signed)
[facility]  PATIENT: Brooke [name] DOB: [birth year]  REFERRING DOCTOR OR PCP:  Derinda Late SOURCE: Patient, notes from recent admission including consult notes, imaging and laboratory reports.   _________________________________   HISTORICAL  CHIEF COMPLAINT:  Chief Complaint  Patient presents with   Follow-up    RM. Last seen 03/11/21. Pt has been having seizures since delivery of baby. Discharged from [facility] 05/19/21. Pt reports having myoclonic jerks. Has been taking lamictal and lorazepam.     HISTORY OF PRESENT ILLNESS:  Brooke [name], at [facility] fwith epilepsy.  Update 05/20/2021 She delivered 2 days ago and had a series of myoclonic jerks.   She continued lamotrigine 150 mg po bid during the pregnancy.  She stopped lorazepam (1 mg at bedtime)as soon as she found out she was pregnant.   She also has a 64 1/2 year old son.   She is not planning additional kids.     She had post-partum depression  after her last pregnancy and is on buspirone at this time.  She feels mood is much better this time.     She has not had any more GTC seizures.    She is on lamotrigine 100 mg po bid and Keppra 750 bid.    She has no more morning myoclonus since the lamotrigine was increased.    She had bariatric surgery (was 270 pounds - now 165lbs) and had low albumin when I last saw her.  She was referred to nutrition and her weight is stable and she is eating better.     She has some anxiety.   Lorazepam in the past had helped anxiety.     She has migraine headaches.   They have generally done well since her son is back in school.    She is now sleeping 9-10 hours because she gets sleepy at 7 pm and goes to bed at wakes up to get son     Reference:  A post-partum study ([place] and [place]) found clinically insignificant amounts of lorazepam in breastmilk even at a dose of 2.5 mg twice daily for the first 5 days  post-natally.    Some lamotrigine will enter breastmilk but her potential of benefit outweighs this risk.     Seizure history She was diagnosed with juvenile myoclonic epilepsy at age 74.   At that time, she had episodes of myoclonus, especially in the mornings.  She noted that going back to sleep would help.   She saw Dr. Gaynell Face and was placed on Depakote but gained weight and lost hair.  On a combination of lamotrigine and Depakote, she did better with decent tolerability and no further hair loss.   At some point, she was switched to Topamax which helped the best (no myoclonus) but caused tingling and altered taste.    Myoclonus always did better if she slept better.  She had a flurry of myoclonic jerks associated with hr son's birth 6 years ago  She never had a definite absence seizure.   However, Two years ago, she had a possible absence event.   She had a conversation at work but does not recall several minutes.      Her first generalized tonic clonic seizure was 05/19/2020.   She was unable to sleep due to feeling very cold.  She decided to take a warm bath.  While getting the tub ready, she started convulsing.   The husband who was in different part of the  house heard the noise and ran in and saw her convulsing for about 1 minute.    He got her on the bathroom floor.   He called 911 after she stopped seizing.   THis was about 1030 am. She went to the ED.   she was conversant after the seizure but she does not recall that period of time.  About noon, in the ED, she had a second GTC seizure.   She was started on Keppra and lamotrigine.   She does not recall the rest of that day and has spotty memory of the next few days.   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     Studies: CT scan 05/19/2020 was normal.   Actual images were reviewed and I concur.  Labs showed low normal Mg and mildly low Calcium and albumin.    EEG 10/29/2014 showed 1-3  second bursts of generalized irregular 3-4 Hz spike and polyspike and wave discharges with frontal predominance.    REVIEW OF SYSTEMS: Constitutional: No fevers, chills, sweats, or change in appetite Eyes: No visual changes, double vision, eye pain Ear, nose and throat: No hearing loss, ear pain, nasal congestion, sore throat Cardiovascular: No chest pain, palpitations Respiratory:  No shortness of breath at rest or with exertion.   No wheezes GastrointestinaI: No nausea, vomiting, diarrhea, abdominal pain, fecal incontinence Genitourinary:  No dysuria, urinary retention or frequency.  No nocturia. Musculoskeletal:  No neck pain, back pain Integumentary: No rash, pruritus, skin lesions Neurological: as above Psychiatric: No depression at this time.  No anxiety Endocrine: No palpitations, diaphoresis, change in appetite, change in weigh or increased thirst Hematologic/Lymphatic:  No anemia, purpura, petechiae. Allergic/Immunologic: No itchy/runny eyes, nasal congestion, recent allergic reactions, rashes  ALLERGIES: Allergies  Allergen Reactions   Nsaids     Post gastric bypass    HOME MEDICATIONS:  Current Outpatient Medications:    Accu-Chek FastClix Lancets MISC, Use to check blood sugar daily, Disp: 102 each, Rfl: 0   acetaminophen (TYLENOL) 500 MG tablet, Take 2 tablets (1,000 mg total) by mouth every 6 (six) hours as needed., Disp: 100 tablet, Rfl: 2   benzocaine-Menthol (DERMOPLAST) 20-0.5 % AERO, Apply 1 application topically as needed for irritation (perineal discomfort)., Disp: , Rfl:    busPIRone (BUSPAR) 5 MG tablet, Take 1-2 tablets (5-10 mg total) by mouth 2 (two) times daily., Disp: 60 tablet, Rfl: 1   calcium carbonate (TUMS - DOSED IN MG ELEMENTAL CALCIUM) 500 MG chewable tablet, Chew 3 tablets by mouth daily., Disp: , Rfl:    coconut oil OIL, Apply 1 application topically as needed., Disp: , Rfl: 0   glucose blood (ACCU-CHEK GUIDE) test strip, Use to check blood  sugar daily, Disp: 100 each, Rfl: 0   lamoTRIgine (LAMICTAL) 150 MG tablet, Take 1 tablet (150 mg total) by mouth 2 (two) times daily., Disp: 60 tablet, Rfl: 11   LORazepam (ATIVAN) 1 MG tablet, Take 1 tablet (1 mg total) by mouth daily as needed for anxiety (for myoclonus)., Disp: 30 tablet, Rfl: 1   Magnesium 400 MG CAPS, Take 400 mg by mouth daily in the afternoon., Disp: , Rfl:    Multiple Vitamin (MULTI VITAMIN PO), Take 1 tablet by mouth daily. Bariatric mutlti vitamin, Disp: , Rfl:    oxyCODONE (OXY IR/ROXICODONE) 5 MG immediate release tablet, Take 1 tablet (5 mg total) by mouth every 6 (six) hours as needed for up to 3  days for moderate pain., Disp: 12 tablet, Rfl: 0   pantoprazole (PROTONIX) 40 MG tablet, Take 1 tablet (40 mg total) by mouth at bedtime., Disp: 90 tablet, Rfl: 3  PAST MEDICAL HISTORY: Past Medical History:  Diagnosis Date   Alopecia    wears hair topper -   Anxiety    Arthritis    Back pain    Depression    Epilepsy ([place])    GAD (generalized anxiety disorder)    GERD (gastroesophageal reflux disease)    History of concussion age 7   per pt hit in head w/ softball , forehead area,  no loc had multiple stitches   Hypertension    Infertility, female    MDD (major depressive disorder)    Migraines    Nonintractable juvenile myoclonic epilepsy without status epilepticus ([place]) age 57   (06-15-2020  per pt no seizure since 05-19-2020)   neurologist--- dr Felecia Shelling--- pt previously seen by dr hickling/ dr Jacelyn Grip,  pt was referred to dr Alianah Lofton from ED visit / overnight stay for pt's first two generalized tonic clonic seizure's , pt had stated unable to sleep (second one was witnessed in ED);  per dr Tanja Gift note in epic 06-09-2020 pt is back to baseline with intermittant myoclinic jerks with medication adjustement, normal head CT 05-19-2020   PCOS (polycystic ovarian syndrome)    Prediabetes    Sleep apnea     PAST SURGICAL HISTORY: Past Surgical History:  Procedure  Laterality Date   CERVICAL CERCLAGE N/A 06/26/2014   Procedure: CERCLAGE CERVICAL;  Surgeon: Lovenia Kim, MD;  Location: [facility];  Service: Gynecology;  Laterality: N/A;   CERVICAL CERCLAGE N/A 11/17/2020   Procedure: [place] CERVICAL CERCLAGE;  Surgeon: Brien Few, MD;  Location: [facility];  Service: Gynecology;  Laterality: N/A;  EDD: 05/25/21   DILATATION & CURETTAGE/HYSTEROSCOPY WITH MYOSURE N/A 02/16/2017   Procedure: DILATATION & CURETTAGE/HYSTEROSCOPY WITH MYOSURE RESECTION OF ENDOMETRIAL POLYP;  Surgeon: Brien Few, MD;  Location: [facility];  Service: Gynecology;  Laterality: N/A;   DILATION AND EVACUATION N/A 06/17/2020   Procedure: DILATATION AND EVACUATION;  Surgeon: Brien Few, MD;  Location: [facility];  Service: Gynecology;  Laterality: N/A;   ROUX-EN-Y GASTRIC BYPASS  02-03-2020  @ [facility]   LAPAROSCOPIC   TONSILLECTOMY  CHILD   TYMPANOSTOMY TUBE PLACEMENT Bilateral child   WISDOM TOOTH EXTRACTION  age 45    FAMILY HISTORY: Family History  Problem Relation Age of Onset   Diabetes Mother    Hypertension Mother    Heart disease Mother    Obesity Mother    Hypertension Father    Obesity Father    Alcohol abuse Maternal Grandmother    Hearing loss Paternal Grandfather     SOCIAL HISTORY:  Social History   Socioeconomic History   Marital status: Married    Spouse name: Not on file   Number of children: 1   Years of education: Not on file   Highest education level: Not on file  Occupational History   Occupation: Stay at home mother  Tobacco Use   Smoking status: Never   Smokeless tobacco: Never  Vaping Use   Vaping Use: Never used  Substance and Sexual Activity   Alcohol use: No   Drug use: Never   Sexual activity: Yes    Partners: Male    Birth control/protection: None  Other Topics Concern   Not on file  Social History Narrative   Married: no kids  Regular exercise: not at this time   Caffeine use: no   Lives with husband    Social Determinants of Corporate investment banker Strain: Not on file  Food Insecurity: Not on file  Transportation Needs: Not on file  Physical Activity: Not on file  Stress: Not on file  Social Connections: Not on file  Intimate Partner Violence: Not on file     PHYSICAL EXAM  Vitals:   05/20/21 0915  BP: 122/70  Pulse: 85  SpO2: 97%  Weight: 172 lb (78 kg)  Height: 5\' 4"  (1.626 m)    Body mass index is 29.52 kg/m.   General: The patient is well-developed and well-nourished and in no acute distress  HEENT:  Head is [place]/AT.  Sclera are anicteric.    Skin: Extremities are without rash or  edema.  Neurologic Exam  Mental status: The patient is alert and oriented x 3 at the time of the examination. The patient has apparent normal recent and remote memory, with an apparently normal attention span and concentration ability.   Speech is normal.  Cranial nerves: Extraocular movements are full.  Facial strength and sensation was normal. Motor:  Muscle bulk is normal.   Tone is normal. Strength is  5 / 5 in all 4 extremities.   Sensory: Sensory testing is intact to pinprick, soft touch and vibration sensation in all 4 extremities.  Coordination: Cerebellar testing reveals good finger-nose-finger and heel-to-shin bilaterally.  Gait and station: Station is normal.  Gait and tandem gait are normal.  Romberg is negative.  Reflexes: Deep tendon reflexes are symmetric and normal bilaterally.       DIAGNOSTIC DATA (LABS, IMAGING, TESTING) - I reviewed patient records, labs, notes, testing and imaging myself where available.  Lab Results  Component Value Date   WBC 9.5 05/19/2021   HGB 11.2 (L) 05/19/2021   HCT 33.5 (L) 05/19/2021   MCV 90.8 05/19/2021   PLT 246 05/19/2021      Component Value Date/Time   NA 139 01/13/2021 1604   K 4.5 01/13/2021 1604   CL 103 01/13/2021 1604   CO2 21 01/13/2021 1604   GLUCOSE 88 01/13/2021 1604   GLUCOSE 80 05/20/2020 0435    BUN 12 01/13/2021 1604   CREATININE 0.66 01/13/2021 1604   CALCIUM 9.1 01/13/2021 1604   PROT 6.0 01/13/2021 1604   ALBUMIN 4.0 01/13/2021 1604   AST 15 01/13/2021 1604   ALT 13 01/13/2021 1604   ALKPHOS 43 (L) 01/13/2021 1604   BILITOT <0.2 01/13/2021 1604   GFRNONAA >60 05/20/2020 0435   GFRAA >60 09/26/2014 1234    Lab Results  Component Value Date   TSH 0.843 01/13/2021       ASSESSMENT AND PLAN  Nonintractable juvenile myoclonic epilepsy without status epilepticus (HCC)  Hypnic jerks  Bariatric surgery status  Delivery normal  Anxiety   1.  Continue lamotrigine --- she will do 150 mg po tid this week and then go back to 150 mg po bid due to the flurry of myoclonic jerks from her JME.   The benefit for her keeping seizures under control justifies the small risk with breast-feeding.  COnsider adding zonisamide if not better.   2.  Lorazepam 1 mg nightly for anxiety and insomnia.   May also have some benefit for JME 3.  Return in 12 months or sooner if there are new or worsening neurologic symptoms.  Tristin Gladman A. Epimenio Foot, MD, [facility] 05/20/2021, 10:13 AM Certified in Neurology, Clinical  Neurophysiology, Sleep Medicine and Neuroimaging  [facility] [address], [place], [place] [address] [phone]

## 2021-06-01 ENCOUNTER — Telehealth (HOSPITAL_COMMUNITY): Payer: Self-pay | Admitting: *Deleted

## 2021-06-01 NOTE — Telephone Encounter (Signed)
Attempted hospital discharge follow-up call. Left message for patient to return RN call. Deforest Hoyles, RN, 06/01/21, [phone]

## 2021-08-17 ENCOUNTER — Encounter: Payer: Self-pay | Admitting: Family Medicine

## 2021-08-17 ENCOUNTER — Other Ambulatory Visit: Payer: Self-pay

## 2021-08-17 MED ORDER — LORAZEPAM 1 MG PO TABS: 1.0000 mg | ORAL_TABLET | Freq: Every day | ORAL | 1 refills | Status: DC | PRN

## 2021-08-17 NOTE — Telephone Encounter (Signed)
Patient is due for refill on ativan. Patient is up to date on her appointments. [place] Controlled Substance Registry checked and is appropriate. ? ?

## 2021-09-05 ENCOUNTER — Other Ambulatory Visit: Payer: Self-pay | Admitting: Neurology

## 2021-09-09 ENCOUNTER — Ambulatory Visit: Payer: 59 | Admitting: Neurology

## 2021-09-09 ENCOUNTER — Encounter: Payer: Self-pay | Admitting: Neurology

## 2021-09-09 VITALS — BP 139/86 | HR 64 | Ht 64.0 in | Wt 172.5 lb

## 2021-09-09 DIAGNOSIS — G40B09 Juvenile myoclonic epilepsy, not intractable, without status epilepticus: Secondary | ICD-10-CM

## 2021-09-09 DIAGNOSIS — F518 Other sleep disorders not due to a substance or known physiological condition: Secondary | ICD-10-CM | POA: Diagnosis not present

## 2021-09-09 DIAGNOSIS — F419 Anxiety disorder, unspecified: Secondary | ICD-10-CM

## 2021-09-09 MED ORDER — LORAZEPAM 1 MG PO TABS: 1.0000 mg | ORAL_TABLET | Freq: Every day | ORAL | 5 refills | Status: DC | PRN

## 2021-09-09 NOTE — Progress Notes (Signed)
? ?[facility] ? ?PATIENT: Brooke [name] ?DOB: Apr 12, 1986 ? ?REFERRING DOCTOR OR PCP:  Kandyce Rud ?SOURCE: Patient, notes from recent admission including consult notes, imaging and laboratory reports. ? ? ?_________________________________ ? ? ?HISTORICAL ? ?CHIEF COMPLAINT:  ?Chief Complaint  ?Patient presents with  ? Follow-up  ?  Rm 2, alone. Pt reports doing well today. Has been taking lorazepam daily. Pt did have myoclonic jerks beginning of her cycle last month.   ? ? ?HISTORY OF PRESENT ILLNESS:  ?Brooke [name], at [facility] with epilepsy. ? ?Update 09/09/2021: ?Her JME has generally done well on lamotrigine 150 mg po bid (and tid on days she has myoclonic jerks).   Her last GTC was 05/2020.   ? ?She delivered a healthy girl 05/2021 and also has a 7yo son.   She continued lamotrigine 150 mg po bid during the pregnancy.      She had no post-partum depression with this pregnancy but did have after her last pregnancy .   ? ?She has not had any more GTC seizures.    She is on lamotrigine 100 mg po bid  ? ?She had bariatric surgery (was 270 pounds - now 172lbs).   She was referred to nutrition and her weight is stable and she is eating better.    ? ?She has anxiety, esp at night.   Low dose lorazepam helps her quickly.   She takes just 1/2 pill twice a day.    ? ?She no longer has migraine headaches since sleeping longer.    She is now sleeping 9-10 hours because she gets sleepy at 7 pm and goes to bed at wakes up to get son  ?  ?Her first cousin has petit mal seizures.   ? ?Reference:  A post-partum study ([place] and Casandra Doffing) found clinically insignificant amounts of lorazepam in breastmilk even at a dose of 2.5 mg twice daily for the first 5 days post-natally.   ? ?Some lamotrigine will enter breastmilk but her potential of benefit outweighs this risk.    ? ?Seizure history ?She was diagnosed with juvenile myoclonic epilepsy at age 36.   At that  time, she had episodes of myoclonus, especially in the mornings.  She noted that going back to sleep would help.   She saw Dr. Sharene Skeans and was placed on Depakote but gained weight and lost hair.  On a combination of lamotrigine and Depakote, she did better with decent tolerability and no further hair loss.   At some point, she was switched to Topamax which helped the best (no myoclonus) but caused tingling and altered taste.    Myoclonus always did better if she slept better.  She had a flurry of myoclonic jerks associated with hr son's birth 6 years ago  She never had a definite absence seizure.   However, Two years ago, she had a possible absence event.   She had a conversation at work but does not recall several minutes.     ? ?Her first generalized tonic clonic seizure was 05/19/2020.   She was unable to sleep due to feeling very cold.  She decided to take a warm bath.  While getting the tub ready, she started convulsing.   The husband who was in different part of the house heard the noise and ran in and saw her convulsing for about 1 minute.    He got her on the bathroom floor.   He called 911 after she stopped seizing.  THis was about 1030 am. She went to the ED.   she was conversant after the seizure but she does not recall that period of time.  About noon, in the ED, she had a second GTC seizure.   She was started on Keppra and lamotrigine.   She does not recall the rest of that day and has spotty memory of the next few days. ? ? ?Bariatric history: ?On 02/03/2019 she had a [place] -Y gastric bypass and she has lost 75 pounds.    ? ?Studies: ?CT scan 05/19/2020 was normal.   Actual images were reviewed and I concur.  Labs showed low normal Mg and mildly low Calcium and albumin.   ? ?EEG 10/29/2014 showed 1-3 second bursts of generalized irregular 3-4 Hz spike and polyspike and wave discharges with frontal predominance. ? ? ? ?REVIEW OF SYSTEMS: ?Constitutional: No fevers, chills, sweats, or change in  appetite ?Eyes: No visual changes, double vision, eye pain ?Ear, nose and throat: No hearing loss, ear pain, nasal congestion, sore throat ?Cardiovascular: No chest pain, palpitations ?Respiratory:  No shortness of breath at rest or with exertion.   No wheezes ?GastrointestinaI: No nausea, vomiting, diarrhea, abdominal pain, fecal incontinence ?Genitourinary:  No dysuria, urinary retention or frequency.  No nocturia. ?Musculoskeletal:  No neck pain, back pain ?Integumentary: No rash, pruritus, skin lesions ?Neurological: as above ?Psychiatric: No depression at this time.  No anxiety ?Endocrine: No palpitations, diaphoresis, change in appetite, change in weigh or increased thirst ?Hematologic/Lymphatic:  No anemia, purpura, petechiae. ?Allergic/Immunologic: No itchy/runny eyes, nasal congestion, recent allergic reactions, rashes ? ?ALLERGIES: ?Allergies  ?Allergen Reactions  ? Nsaids   ?  Post gastric bypass  ? ? ?HOME MEDICATIONS: ? ?Current Outpatient Medications:  ?  acetaminophen (TYLENOL) 500 MG tablet, Take 2 tablets (1,000 mg total) by mouth every 6 (six) hours as needed., Disp: 100 tablet, Rfl: 2 ?  busPIRone (BUSPAR) 5 MG tablet, Take 1-2 tablets (5-10 mg total) by mouth 2 (two) times daily., Disp: 60 tablet, Rfl: 1 ?  lamoTRIgine (LAMICTAL) 150 MG tablet, TAKE 1 TABLET BY MOUTH TWICE A DAY, Disp: 60 tablet, Rfl: 11 ?  Magnesium 400 MG CAPS, Take 400 mg by mouth daily in the afternoon., Disp: , Rfl:  ?  Multiple Vitamin (MULTI VITAMIN PO), Take 1 tablet by mouth daily. Bariatric mutlti vitamin, Disp: , Rfl:  ?  pantoprazole (PROTONIX) 40 MG tablet, Take 1 tablet (40 mg total) by mouth at bedtime., Disp: 90 tablet, Rfl: 3 ?  LORazepam (ATIVAN) 1 MG tablet, Take 1 tablet (1 mg total) by mouth daily as needed for anxiety (for myoclonus)., Disp: 30 tablet, Rfl: 5 ? ?PAST MEDICAL HISTORY: ?Past Medical History:  ?Diagnosis Date  ? Alopecia   ? wears hair topper -  ? Anxiety   ? Arthritis   ? Back pain   ?  Depression   ? Epilepsy (HCC)   ? GAD (generalized anxiety disorder)   ? GERD (gastroesophageal reflux disease)   ? History of concussion age 57  ? per pt hit in head w/ softball , forehead area,  no loc had multiple stitches  ? Hypertension   ? Infertility, female   ? MDD (major depressive disorder)   ? Migraines   ? Nonintractable juvenile myoclonic epilepsy without status epilepticus [facility]) age 57   (06-15-2020  per pt no seizure since 05-19-2020)  ? neurologist--- dr Epimenio Foot--- pt previously seen by dr hickling/ dr Modesto Charon,  pt was referred to dr Yaman Grauberger  from ED visit / overnight stay for pt's first two generalized tonic clonic seizure's , pt had stated unable to sleep (second one was witnessed in ED);  per dr Lot Medford note in epic 06-09-2020 pt is back to baseline with intermittant myoclinic jerks with medication adjustement, normal head CT 05-19-2020  ? PCOS (polycystic ovarian syndrome)   ? Prediabetes   ? Sleep apnea   ? ? ?PAST SURGICAL HISTORY: ?Past Surgical History:  ?Procedure Laterality Date  ? CERVICAL CERCLAGE N/A 06/26/2014  ? Procedure: CERCLAGE CERVICAL;  Surgeon: Lenoard Adenichard J Taavon, MD;  Location: [facility];  Service: Gynecology;  Laterality: N/A;  ? CERVICAL CERCLAGE N/A 11/17/2020  ? Procedure: [place] CERVICAL CERCLAGE;  Surgeon: Olivia Mackieaavon, Daquisha Clermont, MD;  Location: [facility];  Service: Gynecology;  Laterality: N/A;  EDD: 05/25/21  ? DILATATION & CURETTAGE/HYSTEROSCOPY WITH MYOSURE N/A 02/16/2017  ? Procedure: DILATATION & CURETTAGE/HYSTEROSCOPY WITH MYOSURE RESECTION OF ENDOMETRIAL POLYP;  Surgeon: Olivia Mackieaavon, Albino Bufford, MD;  Location: [facility];  Service: Gynecology;  Laterality: N/A;  ? DILATION AND EVACUATION N/A 06/17/2020  ? Procedure: DILATATION AND EVACUATION;  Surgeon: Olivia Mackieaavon, Turner Baillie, MD;  [facility];  Service: Gynecology;  Laterality: N/A;  ? ROUX-EN-Y GASTRIC BYPASS  02-03-2020  @ [facility]  ? LAPAROSCOPIC  ? TONSILLECTOMY  CHILD  ? TYMPANOSTOMY TUBE PLACEMENT Bilateral child  ? WISDOM TOOTH  EXTRACTION  age 36  ? ? ?FAMILY HISTORY: ?Family History  ?Problem Relation Age of Onset  ? Diabetes Mother   ? Hypertension Mother   ? Heart disease Mother   ? Obesity Mother   ? Hypertension Father   ? Obesity Father

## 2021-10-26 ENCOUNTER — Encounter: Payer: Self-pay | Admitting: Family Medicine

## 2021-12-15 ENCOUNTER — Encounter (INDEPENDENT_AMBULATORY_CARE_PROVIDER_SITE_OTHER): Payer: Self-pay

## 2022-03-23 NOTE — Patient Instructions (Signed)
Below is our plan:  We will continue lamotrigine 150mg  twice daily and lorazepam 1mg  daily as needed.   Please make sure you are consistent with timing of seizure medication. I recommend annual visit with primary care provider (PCP) for complete physical and routine blood work. I recommend daily intake of vitamin D (400-800iu) and calcium (800-1000mg ) for bone health. Discuss Dexa screening with PCP.   According to [place] law, you can not drive unless you are seizure / syncope free for at least 6 months and under physician's care.  Please maintain precautions. Do not participate in activities where a loss of awareness could harm you or someone else. No swimming alone, no tub bathing, no hot tubs, no driving, no operating motorized vehicles (cars, ATVs, motocycles, etc), lawnmowers, power tools or firearms. No standing at heights, such as rooftops, ladders or stairs. Avoid hot objects such as stoves, heaters, open fires. Wear a helmet when riding a bicycle, scooter, skateboard, etc. and avoid areas of traffic. Set your water heater to 120 degrees or less.  Please make sure you are staying well hydrated. I recommend 50-60 ounces daily. Well balanced diet and regular exercise encouraged. Consistent sleep schedule with 6-8 hours recommended.   Please continue follow up with care team as directed.   Follow up with me in 6 months   You may receive a survey regarding today's visit. I encourage you to leave honest feed back as I do use this information to improve patient care. Thank you for seeing me today!

## 2022-03-23 NOTE — Progress Notes (Unsigned)
No chief complaint on file.    HISTORY OF PRESENT ILLNESS:  03/23/22 ALL:  Brooke [name] is a 36 y.o. female here today for follow up for juvenile myoclonic epilepsy. She continues lamotrigine 150mg  BID and lorazepam 1mg  daily as needed.    HISTORY (copied from Dr previous note)  , at [facility] with epilepsy.   Update 09/09/2021: Her JME has generally done well on lamotrigine 150 mg po bid (and tid on days she has myoclonic jerks).   Her last GTC was 05/2020.     She delivered a healthy girl 05/2021 and also has a 7yo son.   She continued lamotrigine 150 mg po bid during the pregnancy.      She had no post-partum depression with this pregnancy but did have after her last pregnancy .    She has not had any more GTC seizures.    She is on lamotrigine 100 mg po bid    She had bariatric surgery (was 270 pounds - now 172lbs).   She was referred to nutrition and her weight is stable and she is eating better.      She has anxiety, esp at night.   Low dose lorazepam helps her quickly.   She takes just 1/2 pill twice a day.      She no longer has migraine headaches since sleeping longer.    She is now sleeping 9-10 hours because she gets sleepy at 7 pm and goes to bed at wakes up to get son    Her first cousin has petit mal seizures.     Reference:  A post-partum study ([place] and 06/2021) found clinically insignificant amounts of lorazepam in breastmilk even at a dose of 2.5 mg twice daily for the first 5 days post-natally.     Some lamotrigine will enter breastmilk but her potential of benefit outweighs this risk.      Seizure history She was diagnosed with juvenile myoclonic epilepsy at age 46.   At that time, she had episodes of myoclonus, especially in the mornings.  She noted that going back to sleep would help.   She saw Dr. Casandra Doffing and was placed on Depakote but gained weight and lost hair.  On a combination of  lamotrigine and Depakote, she did better with decent tolerability and no further hair loss.   At some point, she was switched to Topamax which helped the best (no myoclonus) but caused tingling and altered taste.    Myoclonus always did better if she slept better.  She had a flurry of myoclonic jerks associated with hr son's birth 6 years ago  She never had a definite absence seizure.   However, Two years ago, she had a possible absence event.   She had a conversation at work but does not recall several minutes.       Her first generalized tonic clonic seizure was 05/19/2020.   She was unable to sleep due to feeling very cold.  She decided to take a warm bath.  While getting the tub ready, she started convulsing.   The husband who was in different part of the house heard the noise and ran in and saw her convulsing for about 1 minute.    He got her on the bathroom floor.   He called 911 after she stopped seizing.   THis was about 1030 am. She went to the ED.   she was conversant after the seizure but she  does not recall that period of time.  About noon, in the ED, she had a second GTC seizure.   She was started on Keppra and lamotrigine.   She does not recall the rest of that day and has spotty memory of the next few days.   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      Studies: CT scan 05/19/2020 was normal.   Actual images were reviewed and I concur.  Labs showed low normal Mg and mildly low Calcium and albumin.     EEG 10/29/2014 showed 1-3 second bursts of generalized irregular 3-4 Hz spike and polyspike and wave discharges with frontal predominance.   REVIEW OF SYSTEMS: Out of a complete 14 system review of symptoms, the patient complains only of the following symptoms, myoclonic jerks and all other reviewed systems are negative.   ALLERGIES: Allergies  Allergen Reactions   Nsaids     Post gastric bypass     HOME MEDICATIONS: Outpatient  Medications Prior to Visit  Medication Sig Dispense Refill   acetaminophen (TYLENOL) 500 MG tablet Take 2 tablets (1,000 mg total) by mouth every 6 (six) hours as needed. 100 tablet 2   busPIRone (BUSPAR) 5 MG tablet Take 1-2 tablets (5-10 mg total) by mouth 2 (two) times daily. 60 tablet 1   lamoTRIgine (LAMICTAL) 150 MG tablet TAKE 1 TABLET BY MOUTH TWICE A DAY 60 tablet 11   LORazepam (ATIVAN) 1 MG tablet Take 1 tablet (1 mg total) by mouth daily as needed for anxiety (for myoclonus). 30 tablet 5   Magnesium 400 MG CAPS Take 400 mg by mouth daily in the afternoon.     Multiple Vitamin (MULTI VITAMIN PO) Take 1 tablet by mouth daily. Bariatric mutlti vitamin     pantoprazole (PROTONIX) 40 MG tablet Take 1 tablet (40 mg total) by mouth at bedtime. 90 tablet 3   No facility-administered medications prior to visit.     PAST MEDICAL HISTORY: Past Medical History:  Diagnosis Date   Alopecia    wears hair topper -   Anxiety    Arthritis    Back pain    Depression    Epilepsy (HCC)    GAD (generalized anxiety disorder)    GERD (gastroesophageal reflux disease)    History of concussion age 89   per pt hit in head w/ softball , forehead area,  no loc had multiple stitches   Hypertension    Infertility, female    MDD (major depressive disorder)    Migraines    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 36   (06-15-2020  per pt no seizure since 05-19-2020)   neurologist--- dr Epimenio Footsater--- pt previously seen by dr hickling/ dr Modesto Charonwong,  pt was referred to dr sater from ED visit / overnight stay for pt's first two generalized tonic clonic seizure's , pt had stated unable to sleep (second one was witnessed in ED);  per dr sater note in epic 06-09-2020 pt is back to baseline with intermittant myoclinic jerks with medication adjustement, normal head CT 05-19-2020   PCOS (polycystic ovarian syndrome)    Prediabetes    Sleep apnea      PAST SURGICAL  HISTORY: Past Surgical History:  Procedure Laterality Date   CERVICAL CERCLAGE N/A 06/26/2014   Procedure: CERCLAGE CERVICAL;  Surgeon: Lenoard Adenichard J Taavon, MD;  Location: [facility];  Service: Gynecology;  Laterality: N/A;   CERVICAL CERCLAGE N/A  11/17/2020   Procedure: [place] CERVICAL CERCLAGE;  Surgeon: Olivia Mackie, MD;  Location: [facility];  Service: Gynecology;  Laterality: N/A;  EDD: 05/25/21   DILATATION & CURETTAGE/HYSTEROSCOPY WITH MYOSURE N/A 02/16/2017   Procedure: DILATATION & CURETTAGE/HYSTEROSCOPY WITH MYOSURE RESECTION OF ENDOMETRIAL POLYP;  Surgeon: Olivia Mackie, MD;  Location: [facility];  Service: Gynecology;  Laterality: N/A;   DILATION AND EVACUATION N/A 06/17/2020   Procedure: DILATATION AND EVACUATION;  Surgeon: Olivia Mackie, MD;  Location: [facility];  Service: Gynecology;  Laterality: N/A;   ROUX-EN-Y GASTRIC BYPASS  02-03-2020  @ [facility]   LAPAROSCOPIC   TONSILLECTOMY  CHILD   TYMPANOSTOMY TUBE PLACEMENT Bilateral child   WISDOM TOOTH EXTRACTION  age 14     FAMILY HISTORY: Family History  Problem Relation Age of Onset   Diabetes Mother    Hypertension Mother    Heart disease Mother    Obesity Mother    Hypertension Father    Obesity Father    Alcohol abuse Maternal Grandmother    Hearing loss Paternal Grandfather      SOCIAL HISTORY: Social History   Socioeconomic History   Marital status: Married    Spouse name: Not on file   Number of children: 1   Years of education: Not on file   Highest education level: Not on file  Occupational History   Occupation: Stay at home mother  Tobacco Use   Smoking status: Never   Smokeless tobacco: Never  Vaping Use   Vaping Use: Never used  Substance and Sexual Activity   Alcohol use: No   Drug use: Never   Sexual activity: Yes    Partners: Male    Birth control/protection: None  Other Topics Concern   Not on file  Social History Narrative   Married: no kids   Regular exercise: not at this  time   Caffeine use: no   Lives with husband   Social Determinants of Corporate investment banker Strain: Not on file  Food Insecurity: Not on file  Transportation Needs: Not on file  Physical Activity: Not on file  Stress: Not on file  Social Connections: Not on file  Intimate Partner Violence: Not on file     PHYSICAL EXAM  There were no vitals filed for this visit.  There is no height or weight on file to calculate BMI.  Generalized: Well developed, in no acute distress  Cardiology: normal rate and rhythm, no murmur auscultated  Respiratory: clear to auscultation bilaterally    Neurological examination  Mentation: Alert oriented to time, place, history taking. Follows all commands speech and language fluent Cranial nerve II-XII: Pupils were equal round reactive to light. Extraocular movements were full, visual field were full on confrontational test. Facial sensation and strength were normal. Head turning and shoulder shrug  were normal and symmetric. Motor: The motor testing reveals 5 over 5 strength of all 4 extremities. Good symmetric motor tone is noted throughout.  Gait and station: Gait is normal.    DIAGNOSTIC DATA (LABS, IMAGING, TESTING) - I reviewed patient records, labs, notes, testing and imaging myself where available.  Lab Results  Component Value Date   WBC 9.5 05/19/2021   HGB 11.2 (L) 05/19/2021   HCT 33.5 (L) 05/19/2021   MCV 90.8 05/19/2021   PLT 246 05/19/2021      Component Value Date/Time   NA 139 01/13/2021 1604   K 4.5 01/13/2021 1604   CL 103 01/13/2021 1604   CO2  21 01/13/2021 1604   GLUCOSE 88 01/13/2021 1604   GLUCOSE 80 05/20/2020 0435   BUN 12 01/13/2021 1604   CREATININE 0.66 01/13/2021 1604   CALCIUM 9.1 01/13/2021 1604   PROT 6.0 01/13/2021 1604   ALBUMIN 4.0 01/13/2021 1604   AST 15 01/13/2021 1604   ALT 13 01/13/2021 1604   ALKPHOS 43 (L) 01/13/2021 1604   BILITOT <0.2 01/13/2021 1604   GFRNONAA >60 05/20/2020 0435    GFRAA >60 09/26/2014 1234   Lab Results  Component Value Date   CHOL 151 07/30/2020   HDL 61 07/30/2020   LDLCALC 74 07/30/2020   TRIG 83 07/30/2020   CHOLHDL 2.5 07/30/2020   Lab Results  Component Value Date   HGBA1C 4.9 01/13/2021   Lab Results  Component Value Date   VITAMINB12 1,430 (H) 01/13/2021   Lab Results  Component Value Date   TSH 0.843 01/13/2021        No data to display               No data to display           ASSESSMENT AND PLAN  36 y.o. year old female  has a past medical history of Alopecia, Anxiety, Arthritis, Back pain, Depression, Epilepsy (HCC), GAD (generalized anxiety disorder), GERD (gastroesophageal reflux disease), History of concussion (age 70), Hypertension, Infertility, female, MDD (major depressive disorder), Migraines, Nonintractable juvenile myoclonic epilepsy without status epilepticus (HCC) (age 75   (06-15-2020  per pt no seizure since 05-19-2020)), PCOS (polycystic ovarian syndrome), Prediabetes, and Sleep apnea. here with    No diagnosis found.  We will check lamotrigine level, today. She will continue 150mg  BID. Will discuss levetiracetam with Dr . She does not wish to restart it at this time but wishes to have it with her during delivery as she reports significant worsening of myoclonic jerks with delivery of her son 6 years ago. Healthy lifestyle habits encouraged. She will discuss buspirone dose with OB. I will have her check in with Dr Epimenio Foot in 6 months.    No orders of the defined types were placed in this encounter.     No orders of the defined types were placed in this encounter.    Epimenio Foot, MSN, FNP-C 03/23/2022, 9:10 AM  [facility] [address] [place], [place] [phone]

## 2022-03-24 ENCOUNTER — Ambulatory Visit (INDEPENDENT_AMBULATORY_CARE_PROVIDER_SITE_OTHER): Payer: 59 | Admitting: Family Medicine

## 2022-03-24 ENCOUNTER — Encounter: Payer: Self-pay | Admitting: Family Medicine

## 2022-03-24 VITALS — BP 121/61 | HR 69 | Ht 64.0 in | Wt 156.0 lb

## 2022-03-24 DIAGNOSIS — G40B09 Juvenile myoclonic epilepsy, not intractable, without status epilepticus: Secondary | ICD-10-CM | POA: Diagnosis not present

## 2022-03-24 DIAGNOSIS — F419 Anxiety disorder, unspecified: Secondary | ICD-10-CM | POA: Diagnosis not present

## 2022-03-24 MED ORDER — LAMOTRIGINE 150 MG PO TABS: ORAL_TABLET | ORAL | 11 refills | Status: DC

## 2022-03-25 LAB — LAMOTRIGINE LEVEL: Lamotrigine Lvl: 6.9 ug/mL (ref 2.0–20.0)

## 2022-05-18 ENCOUNTER — Other Ambulatory Visit (HOSPITAL_BASED_OUTPATIENT_CLINIC_OR_DEPARTMENT_OTHER): Payer: Self-pay

## 2022-05-18 MED ORDER — LISDEXAMFETAMINE DIMESYLATE 60 MG PO CHEW
60.0000 mg | CHEWABLE_TABLET | Freq: Every day | ORAL | 0 refills | Status: DC
  Filled 2022-05-18: qty 30, 30d supply, fill #0

## 2022-05-23 ENCOUNTER — Ambulatory Visit: Payer: 59 | Admitting: Neurology

## 2022-05-23 ENCOUNTER — Encounter: Payer: Self-pay | Admitting: Neurology

## 2022-06-23 ENCOUNTER — Other Ambulatory Visit (HOSPITAL_BASED_OUTPATIENT_CLINIC_OR_DEPARTMENT_OTHER): Payer: Self-pay

## 2022-06-23 MED ORDER — LISDEXAMFETAMINE DIMESYLATE 60 MG PO CHEW
60.0000 mg | CHEWABLE_TABLET | Freq: Every morning | ORAL | 0 refills | Status: DC
  Filled 2022-06-23: qty 30, 30d supply, fill #0

## 2022-07-23 ENCOUNTER — Other Ambulatory Visit (HOSPITAL_BASED_OUTPATIENT_CLINIC_OR_DEPARTMENT_OTHER): Payer: Self-pay

## 2022-07-23 MED ORDER — LISDEXAMFETAMINE DIMESYLATE 60 MG PO CHEW
60.0000 mg | CHEWABLE_TABLET | Freq: Every morning | ORAL | 0 refills | Status: DC
  Filled 2022-07-23: qty 30, 30d supply, fill #0

## 2022-08-18 ENCOUNTER — Other Ambulatory Visit (HOSPITAL_BASED_OUTPATIENT_CLINIC_OR_DEPARTMENT_OTHER): Payer: Self-pay

## 2022-08-18 MED ORDER — LISDEXAMFETAMINE DIMESYLATE 60 MG PO CHEW
60.0000 mg | CHEWABLE_TABLET | Freq: Every day | ORAL | 0 refills | Status: DC
  Filled 2022-08-18 – 2022-08-22 (×2): qty 30, 30d supply, fill #0

## 2022-08-22 ENCOUNTER — Other Ambulatory Visit: Payer: Self-pay

## 2022-08-22 ENCOUNTER — Other Ambulatory Visit (HOSPITAL_BASED_OUTPATIENT_CLINIC_OR_DEPARTMENT_OTHER): Payer: Self-pay

## 2022-09-14 ENCOUNTER — Other Ambulatory Visit: Payer: Self-pay | Admitting: Neurology

## 2022-09-14 NOTE — Telephone Encounter (Signed)
Last seen 03/24/22 per note "will have her increase lamotrigine to 150mg  QAM, 75mg  at lunch and 150mg  at bedtime. "  Follow up scheduled on 09/26/22  Last filled on 09/02/22 #75 tablets (30 day supply)

## 2022-09-15 ENCOUNTER — Other Ambulatory Visit (HOSPITAL_BASED_OUTPATIENT_CLINIC_OR_DEPARTMENT_OTHER): Payer: Self-pay

## 2022-09-16 ENCOUNTER — Other Ambulatory Visit (HOSPITAL_BASED_OUTPATIENT_CLINIC_OR_DEPARTMENT_OTHER): Payer: Self-pay

## 2022-09-18 ENCOUNTER — Other Ambulatory Visit (HOSPITAL_BASED_OUTPATIENT_CLINIC_OR_DEPARTMENT_OTHER): Payer: Self-pay

## 2022-09-18 MED ORDER — LISDEXAMFETAMINE DIMESYLATE 60 MG PO CHEW
60.0000 mg | CHEWABLE_TABLET | Freq: Every day | ORAL | 0 refills | Status: DC
  Filled 2022-09-19: qty 30, 30d supply, fill #0

## 2022-09-19 ENCOUNTER — Other Ambulatory Visit (HOSPITAL_BASED_OUTPATIENT_CLINIC_OR_DEPARTMENT_OTHER): Payer: Self-pay

## 2022-09-19 ENCOUNTER — Other Ambulatory Visit: Payer: Self-pay

## 2022-09-22 NOTE — Progress Notes (Signed)
PATIENT: Brooke [name] DOB: 02/28/1986  REASON FOR VISIT: follow up HISTORY FROM: patient  Virtual Visit via Telephone Note  I connected with Brooke [name] on 09/26/22 at  2:00 PM EDT by telephone and verified that I am speaking with the correct person using two identifiers.   I discussed the limitations, risks, security and privacy concerns of performing an evaluation and management service by telephone and the availability of in person appointments. I also discussed with the patient that there may be a patient responsible charge related to this service. The patient expressed understanding and agreed to proceed.   History of Present Illness:  09/26/22 ALL (Mychart):  Brooke [name] returns for follow up for JME. She was last seen 03/2022 and we increased lamotrigine to 150mg  QAM, 75mg  at lunch (for about a week around menstrual cycle) and 150mg  QPM. Since, she reports doing well from a seizure standpoint. No seizure events since last visit. She has not used lorazepam since last visit. She was concerned of brian fog and word finding difficulty. Since stopping, these concerns are much better. She continues to have intermittent difficulty getting to sleep. She has taken melatonin but it makes her feel groggy the next day. Unsure of dose. She stays active with her two year old but no regular physical exercise.   She has been more tired. Noticed toe nails were bluish colored. Ferritin was 8. She is taking OTC iron and vitamin C supplements. She is followed by Brooke [name]. She is s/p gastric bypass in 01/2019.   03/24/2022 ALL: Brooke [name] is a 37 y.o. female here today for follow up for juvenile myoclonic epilepsy. She continues lamotrigine 150mg  BID and lorazepam 1mg  QHS. Last refilled 03/06/2022.   She is doing fairly well. She denies tonic clonic seizure activity. She reports feeling very tired and drained around the time of her menstrual cycle. For about 2.5 weeks  she will take 1/2 tablet of lamotrigine for increased myoclonic jerks. She does not feel extra dose makes her more tired and it does seem to settle jerking. She reports being told she was anemic but was able to focus on diet changes. She has not needed infusion therapy.   She continues Vyvanse 30mg  twice daily managed by Brooke [name], nutritionist.   HISTORY (copied from Brooke [name] previous note)  Brooke [name], at [facility] with epilepsy.   Update 09/09/2021: Her JME has generally done well on lamotrigine 150 mg po bid (and tid on days she has myoclonic jerks).   Her last GTC was 05/2020.     She delivered a healthy girl 05/2021 and also has a 7yo son.   She continued lamotrigine 150 mg po bid during the pregnancy.      She had no post-partum depression with this pregnancy but did have after her last pregnancy .    She has not had any more GTC seizures.    She is on lamotrigine 100 mg po bid    She had bariatric surgery (was 270 pounds - now 172lbs).   She was referred to nutrition and her weight is stable and she is eating better.      She has anxiety, esp at night.   Low dose lorazepam helps her quickly.   She takes just 1/2 pill twice a day.      She no longer has migraine headaches since sleeping longer.    She is now sleeping 9-10 hours because she gets sleepy at 7 pm and  goes to bed at wakes up to get son    Her first cousin has petit mal seizures.     Reference:  A post-partum study (Brooke [name] and Brooke [name]) found clinically insignificant amounts of lorazepam in breastmilk even at a dose of 2.5 mg twice daily for the first 5 days post-natally.     Some lamotrigine will enter breastmilk but her potential of benefit outweighs this risk.      Seizure history She was diagnosed with juvenile myoclonic epilepsy at age 85.   At that time, she had episodes of myoclonus, especially in the mornings.  She noted that going back to sleep would help.   She saw  Brooke [name] and was placed on Depakote but gained weight and lost hair.  On a combination of lamotrigine and Depakote, she did better with decent tolerability and no further hair loss.   At some point, she was switched to Topamax which helped the best (no myoclonus) but caused tingling and altered taste.    Myoclonus always did better if she slept better.  She had a flurry of myoclonic jerks associated with hr son's birth 6 years ago  She never had a definite absence seizure.   However, Two years ago, she had a possible absence event.   She had a conversation at work but does not recall several minutes.       Her first generalized tonic clonic seizure was 05/19/2020.   She was unable to sleep due to feeling very cold.  She decided to take a warm bath.  While getting the tub ready, she started convulsing.   The husband who was in different part of the house heard the noise and ran in and saw her convulsing for about 1 minute.    He got her on the bathroom floor.   He called 911 after she stopped seizing.   THis was about 1030 am. She went to the ED.   she was conversant after the seizure but she does not recall that period of time.  About noon, in the ED, she had a second GTC seizure.   She was started on Keppra and lamotrigine.   She does not recall the rest of that day and has spotty memory of the next few days.   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      Studies: CT scan 05/19/2020 was normal.   Actual images were reviewed and I concur.  Labs showed low normal Mg and mildly low Calcium and albumin.     EEG 10/29/2014 showed 1-3 second bursts of generalized irregular 3-4 Hz spike and polyspike and wave discharges with frontal predominance.  Observations/Objective:  Generalized: Well developed, in no acute distress  Mentation: Alert oriented to time, place, history taking. Follows all commands speech and language fluent   Assessment and  Plan:  37 y.o. year old female  has a past medical history of Alopecia, Anxiety, Arthritis, Back pain, Depression, Epilepsy (HCC), GAD (generalized anxiety disorder), GERD (gastroesophageal reflux disease), History of concussion (age 60), Hypertension, Infertility, female, MDD (major depressive disorder), Migraines, Nonintractable juvenile myoclonic epilepsy without status epilepticus (HCC) (age 57   (06-15-2020  per pt no seizure since 05-19-2020)), PCOS (polycystic ovarian syndrome), Prediabetes, and Sleep apnea. here with    ICD-10-CM   1. Nonintractable juvenile myoclonic epilepsy without status epilepticus (HCC)  G40.B91      Brooke [name] is doing well. No recent seizure activity.  We will continue lamotrigine 150mg  BID with 75mg  at lunch during menstrual cycle. She will continue close follow up with PCP. Consider updating lamotrigine labs as needed. Sleep hygiene and healthy lifestyle habits reviewed. She will follow up with me in 1 year, sooner if needed.   No orders of the defined types were placed in this encounter.   Meds ordered this encounter  Medications   lamoTRIgine (LAMICTAL) 150 MG tablet    Sig: Take 1 tablet (150 mg total) by mouth 2 (two) times daily AND 0.5 tablets (75 mg total) daily with lunch.    Dispense:  225 tablet    Refill:  3    Order Specific Question:   Supervising Provider    Answer:   Anson Fret [id number]     Follow Up Instructions:  I discussed the assessment and treatment plan with the patient. The patient was provided an opportunity to ask questions and all were answered. The patient agreed with the plan and demonstrated an understanding of the instructions.   The patient was advised to call back or seek an in-person evaluation if the symptoms worsen or if the condition fails to improve as anticipated.  I provided 20 minutes of non-face-to-face time during this encounter. Patient located at their place of residence during Mychart visit. Provider is in  the office.    Shawnie Dapper, NP

## 2022-09-22 NOTE — Patient Instructions (Addendum)
Below is our plan:  We will continue lamotrigine 150mg  twice daily with extra 75mg  dose as needed during menstrual cycle. Try to increase physical activity to help with insomnia. Consider valerian root or ashwagandha OTC.    Please make sure you are consistent with timing of seizure medication. I recommend annual visit with primary care provider (PCP) for complete physical and routine blood work. I recommend daily intake of vitamin D (400-800iu) and calcium (800-1000mg ) for bone health. Discuss Dexa screening with PCP.   According to [place] law, you can not drive unless you are seizure / syncope free for at least 6 months and under physician's care.  Please maintain precautions. Do not participate in activities where a loss of awareness could harm you or someone else. No swimming alone, no tub bathing, no hot tubs, no driving, no operating motorized vehicles (cars, ATVs, motocycles, etc), lawnmowers, power tools or firearms. No standing at heights, such as rooftops, ladders or stairs. Avoid hot objects such as stoves, heaters, open fires. Wear a helmet when riding a bicycle, scooter, skateboard, etc. and avoid areas of traffic. Set your water heater to 120 degrees or less.  Please make sure you are staying well hydrated. I recommend 50-60 ounces daily. Well balanced diet and regular exercise encouraged. Consistent sleep schedule with 6-8 hours recommended.   Please continue follow up with care team as directed.   Follow up with me in 1 year  You may receive a survey regarding today's visit. I encourage you to leave honest feed back as I do use this information to improve patient care. Thank you for seeing me today!

## 2022-09-26 ENCOUNTER — Telehealth: Payer: 59 | Admitting: Family Medicine

## 2022-09-26 ENCOUNTER — Encounter: Payer: Self-pay | Admitting: Family Medicine

## 2022-09-26 DIAGNOSIS — G40B09 Juvenile myoclonic epilepsy, not intractable, without status epilepticus: Secondary | ICD-10-CM

## 2022-09-26 MED ORDER — LAMOTRIGINE 150 MG PO TABS: ORAL_TABLET | ORAL | 3 refills | Status: DC

## 2022-10-19 ENCOUNTER — Other Ambulatory Visit (HOSPITAL_BASED_OUTPATIENT_CLINIC_OR_DEPARTMENT_OTHER): Payer: Self-pay

## 2022-10-20 ENCOUNTER — Other Ambulatory Visit: Payer: Self-pay

## 2022-10-20 ENCOUNTER — Other Ambulatory Visit (HOSPITAL_BASED_OUTPATIENT_CLINIC_OR_DEPARTMENT_OTHER): Payer: Self-pay

## 2022-10-20 MED ORDER — LISDEXAMFETAMINE DIMESYLATE 60 MG PO CHEW
60.0000 mg | CHEWABLE_TABLET | Freq: Every day | ORAL | 0 refills | Status: DC
  Filled 2022-10-20: qty 30, 30d supply, fill #0

## 2022-11-22 ENCOUNTER — Other Ambulatory Visit (HOSPITAL_BASED_OUTPATIENT_CLINIC_OR_DEPARTMENT_OTHER): Payer: Self-pay

## 2022-11-23 ENCOUNTER — Other Ambulatory Visit (HOSPITAL_BASED_OUTPATIENT_CLINIC_OR_DEPARTMENT_OTHER): Payer: Self-pay

## 2022-11-23 MED ORDER — LISDEXAMFETAMINE DIMESYLATE 60 MG PO CHEW
60.0000 mg | CHEWABLE_TABLET | Freq: Every day | ORAL | 0 refills | Status: DC
Start: 2022-11-23 — End: 2022-12-21
  Filled 2022-11-23: qty 30, 30d supply, fill #0

## 2022-11-24 ENCOUNTER — Other Ambulatory Visit: Payer: Self-pay

## 2022-11-24 ENCOUNTER — Other Ambulatory Visit (HOSPITAL_BASED_OUTPATIENT_CLINIC_OR_DEPARTMENT_OTHER): Payer: Self-pay

## 2022-11-25 ENCOUNTER — Other Ambulatory Visit (HOSPITAL_BASED_OUTPATIENT_CLINIC_OR_DEPARTMENT_OTHER): Payer: Self-pay

## 2022-11-27 ENCOUNTER — Emergency Department (HOSPITAL_COMMUNITY)
Admission: EM | Admit: 2022-11-27 | Discharge: 2022-11-27 | Disposition: A | Discharge status: Home / Self Care | Payer: 59 | Attending: Emergency Medicine | Admitting: Emergency Medicine

## 2022-11-27 ENCOUNTER — Emergency Department (HOSPITAL_COMMUNITY): Payer: 59

## 2022-11-27 ENCOUNTER — Telehealth: Payer: Self-pay | Admitting: Neurology

## 2022-11-27 ENCOUNTER — Other Ambulatory Visit: Payer: Self-pay

## 2022-11-27 DIAGNOSIS — R569 Unspecified convulsions: Secondary | ICD-10-CM

## 2022-11-27 DIAGNOSIS — R258 Other abnormal involuntary movements: Secondary | ICD-10-CM | POA: Diagnosis not present

## 2022-11-27 DIAGNOSIS — R519 Headache, unspecified: Secondary | ICD-10-CM | POA: Diagnosis not present

## 2022-11-27 LAB — URINALYSIS, ROUTINE W REFLEX MICROSCOPIC
Bilirubin Urine: NEGATIVE
Glucose, UA: NEGATIVE mg/dL
Hgb urine dipstick: NEGATIVE
Ketones, ur: 5 mg/dL — AB
Leukocytes,Ua: NEGATIVE
Nitrite: NEGATIVE
Protein, ur: NEGATIVE mg/dL
Specific Gravity, Urine: 1.016 (ref 1.005–1.030)
pH: 5 (ref 5.0–8.0)

## 2022-11-27 LAB — COMPREHENSIVE METABOLIC PANEL
ALT: 29 U/L (ref 0–44)
AST: 22 U/L (ref 15–41)
Albumin: 3.9 g/dL (ref 3.5–5.0)
Alkaline Phosphatase: 59 U/L (ref 38–126)
Anion gap: 9 (ref 5–15)
BUN: 18 mg/dL (ref 6–20)
CO2: 25 mmol/L (ref 22–32)
Calcium: 8.8 mg/dL — ABNORMAL LOW (ref 8.9–10.3)
Chloride: 101 mmol/L (ref 98–111)
Creatinine, Ser: 0.78 mg/dL (ref 0.44–1.00)
GFR, Estimated: >60 mL/min (ref >60)
Glucose, Bld: 105 mg/dL — ABNORMAL HIGH (ref 70–99)
Potassium: 3.8 mmol/L (ref 3.5–5.1)
Sodium: 135 mmol/L (ref 135–145)
Total Bilirubin: 0.7 mg/dL (ref 0.3–1.2)
Total Protein: 6.7 g/dL (ref 6.5–8.1)

## 2022-11-27 LAB — CBC WITH DIFFERENTIAL/PLATELET
Abs Immature Granulocytes: 0.02 10*3/uL (ref 0.00–0.07)
Basophils Absolute: 0 10*3/uL (ref 0.0–0.1)
Basophils Relative: 1 %
Eosinophils Absolute: 0 10*3/uL (ref 0.0–0.5)
Eosinophils Relative: 0 %
HCT: 40.6 % (ref 36.0–46.0)
Hemoglobin: 13.9 g/dL (ref 12.0–15.0)
Immature Granulocytes: 0 %
Lymphocytes Relative: 20 %
Lymphs Abs: 1.7 10*3/uL (ref 0.7–4.0)
MCH: 31.4 pg (ref 26.0–34.0)
MCHC: 34.2 g/dL (ref 30.0–36.0)
MCV: 91.6 fL (ref 80.0–100.0)
Monocytes Absolute: 0.4 10*3/uL (ref 0.1–1.0)
Monocytes Relative: 4 %
Neutro Abs: 6.4 10*3/uL (ref 1.7–7.7)
Neutrophils Relative %: 75 %
Platelets: 282 10*3/uL (ref 150–400)
RBC: 4.43 MIL/uL (ref 3.87–5.11)
RDW: 13.6 % (ref 11.5–15.5)
WBC: 8.5 10*3/uL (ref 4.0–10.5)
nRBC: 0 % (ref 0.0–0.2)

## 2022-11-27 LAB — HCG, SERUM, QUALITATIVE: Preg, Serum: NEGATIVE

## 2022-11-27 LAB — CBG MONITORING, ED: Glucose-Capillary: 106 mg/dL — ABNORMAL HIGH (ref 70–99)

## 2022-11-27 MED ORDER — NAYZILAM 5 MG/0.1ML NA SOLN: 5.0000 mg | NASAL | 2 refills | Status: DC | PRN

## 2022-11-27 MED ORDER — METOCLOPRAMIDE HCL 5 MG/ML IJ SOLN
10.0000 mg | Freq: Once | INTRAMUSCULAR | Status: AC
  Administered 2022-11-27: 10 mg via INTRAVENOUS
  Filled 2022-11-27: qty 2

## 2022-11-27 MED ORDER — DIPHENHYDRAMINE HCL 50 MG/ML IJ SOLN
25.0000 mg | Freq: Once | INTRAMUSCULAR | Status: AC
Start: 2022-11-27 — End: 2022-11-27
  Administered 2022-11-27: 25 mg via INTRAVENOUS
  Filled 2022-11-27: qty 1

## 2022-11-27 MED ORDER — KETOROLAC TROMETHAMINE 15 MG/ML IJ SOLN
15.0000 mg | Freq: Once | INTRAMUSCULAR | Status: AC
  Administered 2022-11-27: 15 mg via INTRAVENOUS
  Filled 2022-11-27: qty 1

## 2022-11-27 NOTE — Telephone Encounter (Signed)
Mr Brooke [name]-   Please print a [name] savings card  for your wife before you go to your pharmacy.   Brooke Novas, MD

## 2022-11-27 NOTE — ED Triage Notes (Signed)
Pt in kitchen, having aura, grand mal seizure lasting 30sec.husband guided pt to floor and called 911.c/o migraine for 2 days which limits her sleep and was probably the cause of seizure. Pt a and o x 4, ambulatory but weak.

## 2022-11-27 NOTE — Telephone Encounter (Signed)
Dr.Sater,   This patient's Husband contacted me through answer service 11-27-2022 as the on-call physician at  17.13 hours with a report of a prolonged posticum his wife had a fairly violent seizure, she had no visible injuries, she had spat up saliva, no blood was seen.  She was now very sleepy and not appropriately responding but no seizure activity was ongoing.   I asked him to have her rescue medication on hand but the husband didn't have any at home, he will give his wife the PM dose of Lamictal if she is alert enough to swallow,  in the meantime , I will call in [name] 5 mg spray for them. Melvyn Novas, MD

## 2022-11-27 NOTE — Discharge Instructions (Signed)
Follow up with your Physician for recheck.  Return if any problems.  

## 2022-11-27 NOTE — ED Provider Notes (Signed)
[place] EMERGENCY DEPARTMENT AT [facility] Provider Note   CSN: [id number] Arrival date & time: 11/27/22  1957     History  Chief Complaint  Patient presents with   Seizures    Brooke [name] is a 37 y.o. female.  Patient complains of having a seizure prior to arrival.  Patient reports she has had seizures since she was 37 years old.  Patient reports that she developed a migraine headache yesterday.  Patient reports that headaches stress and sleep deprivation trigger her seizures.  Patient is on Lamictal for her seizure disorder.  Patient is here with her husband he states that patient was cooking and dropped the tongues she was using he states he caught her before she hit the floor.  He reports approximately 45 seconds of seizure activity.  He describes a postictal period.  Patient reports her last seizure was approximately 2 years ago.  Patient's husband spoke to neurology prior to arrival.  The history is provided by the patient and the spouse.  Seizures Seizure activity on arrival: yes   Seizure type:  Grand mal Preceding symptoms: no dizziness   Initial focality:  None Postictal symptoms: no confusion and no memory loss   Return to baseline: yes   Severity:  Moderate Timing:  Once Progression:  Worsening      Home Medications Prior to Admission medications   Medication Sig Start Date End Date Taking? Authorizing Provider  lamoTRIgine (LAMICTAL) 150 MG tablet Take 1 tablet (150 mg total) by mouth 2 (two) times daily AND 0.5 tablets (75 mg total) daily with lunch. 09/26/22   Lomax, Amy, NP  Lisdexamfetamine Dimesylate 60 MG CHEW Chew 1 tablet (60 mg total) by mouth daily. 11/23/22     Magnesium 400 MG CAPS Take 400 mg by mouth daily in the afternoon.    [provider]  Midazolam ([name]) 5 MG/0.1ML SOLN Place 5 mg into the nose as needed (only once a day). 11/27/22   Dohmeier, Porfirio Mylar, MD  Multiple Vitamin (MULTI VITAMIN PO) Take 1 tablet by  mouth daily. Bariatric mutlti vitamin    [provider]  VYVANSE 40 MG CHEW Chew 1 tablet by mouth every morning. 11/13/21   [provider]      Allergies    Nsaids    Review of Systems   Review of Systems  Neurological:  Positive for seizures.  All other systems reviewed and are negative.   Physical Exam Updated Vital Signs BP 118/79   Pulse 79   Temp 98.1 F (36.7 C) (Oral)   Resp 20   Ht 5\' 4"  (1.626 m)   Wt 68 kg   SpO2 99%   BMI 25.75 kg/m  Physical Exam Vitals and nursing note reviewed.  Constitutional:      Appearance: She is well-developed.  HENT:     Head: Normocephalic.     Right Ear: External ear normal.     Left Ear: External ear normal.     Nose: Nose normal.     Mouth/Throat:     Mouth: Mucous membranes are moist.  Eyes:     Extraocular Movements: Extraocular movements intact.     Pupils: Pupils are equal, round, and reactive to light.  Cardiovascular:     Rate and Rhythm: Normal rate.  Pulmonary:     Effort: Pulmonary effort is normal.  Abdominal:     General: There is no distension.  Musculoskeletal:        General: Normal range  of motion.     Cervical back: Normal range of motion.  Skin:    General: Skin is warm.  Neurological:     General: No focal deficit present.     Mental Status: She is alert and oriented to person, place, and time.  Psychiatric:        Mood and Affect: Mood normal.     ED Results / Procedures / Treatments   Labs (all labs ordered are listed, but only abnormal results are displayed) Labs Reviewed  COMPREHENSIVE METABOLIC PANEL - Abnormal; Notable for the following components:      Result Value   Glucose, Bld 105 (*)    Calcium 8.8 (*)    All other components within normal limits  URINALYSIS, ROUTINE W REFLEX MICROSCOPIC - Abnormal; Notable for the following components:   Color, Urine STRAW (*)    Ketones, ur 5 (*)    All other components within normal limits  CBG MONITORING, ED - Abnormal;  Notable for the following components:   Glucose-Capillary 106 (*)    All other components within normal limits  CBC WITH DIFFERENTIAL/PLATELET  HCG, SERUM, QUALITATIVE  LAMOTRIGINE LEVEL    EKG EKG Interpretation Date/Time:  Sunday November 27 2022 19:57:43 EDT Ventricular Rate:  79 PR Interval:  125 QRS Duration:  98 QT Interval:  388 QTC Calculation: 445 R Axis:   68  Text Interpretation: Sinus rhythm Confirmed by Alvester Chou [phone]) on 11/27/2022 8:23:21 PM  Radiology CT Head Wo Contrast  Result Date: 11/27/2022 CLINICAL DATA:  Seizure, generalized, normal neuro exam. EXAM: CT HEAD WITHOUT CONTRAST TECHNIQUE: Contiguous axial images were obtained from the base of the skull through the vertex without intravenous contrast. RADIATION DOSE REDUCTION: This exam was performed according to the departmental dose-optimization program which includes automated exposure control, adjustment of the mA and/or kV according to patient size and/or use of iterative reconstruction technique. COMPARISON:  05/19/2020. FINDINGS: Brain: No acute intracranial hemorrhage, midline shift or mass effect. No extra-axial fluid collection. Gray-white matter differentiation is within normal limits. No hydrocephalus. Vascular: No hyperdense vessel or unexpected calcification. Skull: Normal. Negative for fracture or focal lesion. Sinuses/Orbits: Mucosal thickening is present in the left maxillary sinus. No acute orbital abnormality. Other: None. IMPRESSION: No acute intracranial process. Electronically Signed   By: Thornell Sartorius M.D.   On: 11/27/2022 21:15    Procedures Procedures    Medications Ordered in ED Medications  ketorolac (TORADOL) 15 MG/ML injection 15 mg (15 mg Intravenous Given 11/27/22 2107)  metoCLOPramide (REGLAN) injection 10 mg (10 mg Intravenous Given 11/27/22 2107)  diphenhydrAMINE (BENADRYL) injection 25 mg (25 mg Intravenous Given 11/27/22 2107)    ED Course/ Medical Decision Making/ A&P                              Medical Decision Making Patient complains of having had a seizure today.  Patient reports her seizures are triggered by migraines stress and lack of sleep.  Patient reports that she has had all 3.  Amount and/or Complexity of Data Reviewed Independent Historian: spouse    Details: Patient is here with her husband who is supportive External Data Reviewed: notes.    Details: Neurology notes reviewed Labs: ordered. Decision-making details documented in ED Course.    Details: Labs ordered reviewed and interpreted.  UA is negative CBC and c-Met are normal Radiology: ordered.    Details: CT head shows no acute abnormality  Risk  Risk Details: Patient given IV fluids Toradol Reglan and Benadryl.  Patient reports her headache is improved.  Patient's husband spoke with neurology tonight who called in medications to her pharmacy for breakthrough seizures. Patient advised to call neurology tomorrow for further follow-up.  Patient is advised to continue current medications.           Final Clinical Impression(s) / ED Diagnoses Final diagnoses:  Seizure-like activity (HCC)    Rx / DC Orders ED Discharge Orders     None     An After Visit Summary was printed and given to the patient.     Elson Areas, Cordelia Poche 11/27/22 2302    Terald Sleeper, MD 11/27/22 [phone]

## 2022-11-28 ENCOUNTER — Encounter: Payer: Self-pay | Admitting: Family Medicine

## 2022-11-29 LAB — LAMOTRIGINE LEVEL: Lamotrigine Lvl: 5.9 ug/mL (ref 2.0–20.0)

## 2022-12-01 ENCOUNTER — Other Ambulatory Visit: Payer: Self-pay | Admitting: Family Medicine

## 2022-12-01 DIAGNOSIS — G40B09 Juvenile myoclonic epilepsy, not intractable, without status epilepticus: Secondary | ICD-10-CM

## 2022-12-21 ENCOUNTER — Other Ambulatory Visit (HOSPITAL_BASED_OUTPATIENT_CLINIC_OR_DEPARTMENT_OTHER): Payer: Self-pay

## 2022-12-21 MED ORDER — LISDEXAMFETAMINE DIMESYLATE 60 MG PO CHEW
60.0000 mg | CHEWABLE_TABLET | Freq: Every day | ORAL | 0 refills | Status: DC
  Filled 2022-12-23: qty 30, 30d supply, fill #0

## 2022-12-23 ENCOUNTER — Other Ambulatory Visit (HOSPITAL_BASED_OUTPATIENT_CLINIC_OR_DEPARTMENT_OTHER): Payer: Self-pay

## 2022-12-23 ENCOUNTER — Other Ambulatory Visit: Payer: Self-pay

## 2022-12-29 ENCOUNTER — Other Ambulatory Visit (HOSPITAL_BASED_OUTPATIENT_CLINIC_OR_DEPARTMENT_OTHER): Payer: Self-pay

## 2022-12-29 MED ORDER — LORAZEPAM 2 MG PO TABS
2.0000 mg | ORAL_TABLET | Freq: Every evening | ORAL | 0 refills | Status: DC | PRN
  Filled 2022-12-29: qty 30, 30d supply, fill #0

## 2023-01-16 ENCOUNTER — Other Ambulatory Visit (HOSPITAL_BASED_OUTPATIENT_CLINIC_OR_DEPARTMENT_OTHER): Payer: Self-pay

## 2023-01-16 MED ORDER — LISDEXAMFETAMINE DIMESYLATE 60 MG PO CHEW
60.0000 mg | CHEWABLE_TABLET | Freq: Every day | ORAL | 0 refills | Status: DC
  Filled 2023-01-16 – 2023-01-20 (×3): qty 30, 30d supply, fill #0

## 2023-01-18 ENCOUNTER — Other Ambulatory Visit (HOSPITAL_BASED_OUTPATIENT_CLINIC_OR_DEPARTMENT_OTHER): Payer: Self-pay

## 2023-01-20 ENCOUNTER — Other Ambulatory Visit (HOSPITAL_BASED_OUTPATIENT_CLINIC_OR_DEPARTMENT_OTHER): Payer: Self-pay

## 2023-02-13 ENCOUNTER — Other Ambulatory Visit (HOSPITAL_BASED_OUTPATIENT_CLINIC_OR_DEPARTMENT_OTHER): Payer: Self-pay

## 2023-02-13 MED ORDER — LISDEXAMFETAMINE DIMESYLATE 60 MG PO CHEW
60.0000 mg | CHEWABLE_TABLET | Freq: Every day | ORAL | 0 refills | Status: DC
  Filled 2023-02-13 – 2023-02-16 (×2): qty 30, 30d supply, fill #0

## 2023-02-16 ENCOUNTER — Other Ambulatory Visit: Payer: Self-pay

## 2023-02-16 ENCOUNTER — Other Ambulatory Visit (HOSPITAL_BASED_OUTPATIENT_CLINIC_OR_DEPARTMENT_OTHER): Payer: Self-pay

## 2023-02-16 ENCOUNTER — Other Ambulatory Visit: Payer: Self-pay | Admitting: *Deleted

## 2023-02-16 MED ORDER — LAMOTRIGINE 150 MG PO TABS: ORAL_TABLET | ORAL | 6 refills | Status: DC

## 2023-03-23 ENCOUNTER — Other Ambulatory Visit (HOSPITAL_BASED_OUTPATIENT_CLINIC_OR_DEPARTMENT_OTHER): Payer: Self-pay

## 2023-03-23 MED ORDER — LISDEXAMFETAMINE DIMESYLATE 60 MG PO CHEW
60.0000 mg | CHEWABLE_TABLET | Freq: Every day | ORAL | 0 refills | Status: DC
  Filled 2023-03-23: qty 30, 30d supply, fill #0

## 2023-04-14 ENCOUNTER — Encounter: Payer: Self-pay | Admitting: Cardiology

## 2023-04-14 ENCOUNTER — Encounter: Payer: Self-pay | Admitting: Family Medicine

## 2023-04-14 ENCOUNTER — Ambulatory Visit: Payer: 59 | Attending: Cardiology | Admitting: Cardiology

## 2023-04-14 VITALS — BP 126/80 | HR 67 | Ht 64.0 in | Wt 157.8 lb

## 2023-04-14 DIAGNOSIS — R002 Palpitations: Secondary | ICD-10-CM | POA: Diagnosis not present

## 2023-04-14 DIAGNOSIS — I10 Essential (primary) hypertension: Secondary | ICD-10-CM

## 2023-04-14 DIAGNOSIS — Z8669 Personal history of other diseases of the nervous system and sense organs: Secondary | ICD-10-CM

## 2023-04-14 DIAGNOSIS — F419 Anxiety disorder, unspecified: Secondary | ICD-10-CM

## 2023-04-14 DIAGNOSIS — F518 Other sleep disorders not due to a substance or known physiological condition: Secondary | ICD-10-CM

## 2023-04-14 NOTE — Telephone Encounter (Signed)
Called the patient- have an appointment available today at 1630; she is able to make it to this appt today. Was thankful to be able to get in so quick.

## 2023-04-14 NOTE — Progress Notes (Unsigned)
  Cardiology Office Note:   Date:  04/15/2023  ID:  Brooke, [name] June 24, 1985, MRN [id number] PCP: Cyndia Diver, PA-C  [facility] Providers Cardiologist:  Rollene Rotunda, MD {  History of Present Illness:   Brooke [name] is a 37 y.o. female presents for evaluation of palpitations.  I saw her some years ago for arrhythmia.  She was pregnant at that time.  That demonstrated some ventricular bigeminy and junctional rhythm.  There were no sustained tachyarrhythmias.  She had an uncomplicated pregnancy and otherwise did well.  She called to be added to the schedule because she was having palpitations that have been happening at night since Monday.  They have been happening every night and waking her from her sleep.  She is feeling her heart racing.  Shoulders are out of bed.  She gets very short of breath.  She feels like something is sitting on her chest smothering her.  She has been walking around until it slowly improves.  She has not presented to the emergency room.  Please refer not having during the day.  Not had symptoms like this before.  Presyncope or syncope.  He stopped taking Vyvanse because of this.   ROS: As stated in the HPI and negative for all other systems.  Studies Reviewed:    EKG:   EKG Interpretation Date/Time:  Friday April 14 2023 16:43:26 EST Ventricular Rate:  67 PR Interval:  128 QRS Duration:  90 QT Interval:  418 QTC Calculation: 441 R Axis:   64  Text Interpretation: Normal sinus rhythm Normal ECG When compared with ECG of 27-Nov-2022 19:57, No significant change since last tracing Confirmed by Rollene Rotunda ([id number]) on 04/14/2023 5:19:03 PM     Risk Assessment/Calculations:              Physical Exam:   VS:  BP 126/80 (BP Location: Left Arm, Patient Position: Sitting, Cuff Size: Normal)   Pulse 67   Ht 5\' 4"  (1.626 m)   Wt 157 lb 12.8 oz (71.6 kg)   SpO2 99%   BMI 27.09 kg/m    Wt Readings from Last 3  Encounters:  04/14/23 157 lb 12.8 oz (71.6 kg)  11/27/22 150 lb (68 kg)  03/24/22 156 lb (70.8 kg)     GEN: Well nourished, well developed in no acute distress NECK: No JVD; No carotid bruits CARDIAC: RRR, no murmurs, rubs, gallops RESPIRATORY:  Clear to auscultation without rales, wheezing or rhonchi  ABDOMEN: Soft, non-tender, non-distended EXTREMITIES:  No edema; No deformity   ASSESSMENT AND PLAN:   Palpitations: Patient is describing significant tachypalpitations and other symptoms as above.  At this point have suggested that she call 911.  I will send a 3-day Zio patch.  I will check a TSH.  She has had normal electrolytes recently.  She will remain off the Vyvanse.  Follow up with APP after the monitor.    Signed, Rollene Rotunda, MD

## 2023-04-14 NOTE — Patient Instructions (Signed)
Medication Instructions:  No changes  *If you need a refill on your cardiac medications before your next appointment, please call your pharmacy*   Lab Work: TSH, If you have labs (blood work) drawn today and your tests are completely normal, you will receive your results only by: MyChart Message (if you have MyChart) OR A paper copy in the mail If you have any lab test that is abnormal or we need to change your treatment, we will call you to review the results.   Testing/Procedures: Christena Deem- Long Term Monitor Instructions  Your physician has requested you wear a ZIO patch monitor for 3 days.  This is a single patch monitor. [employer] supplies one patch monitor per enrollment. Additional stickers are not available. Please do not apply patch if you will be having a Nuclear Stress Test,  Echocardiogram, Cardiac CT, MRI, or Chest Xray during the period you would be wearing the  monitor. The patch cannot be worn during these tests. You cannot remove and re-apply the  ZIO XT patch monitor.  Your ZIO patch monitor will be mailed 3 day USPS to your address on file. It may take 3-5 days  to receive your monitor after you have been enrolled.  Once you have received your monitor, please review the enclosed instructions. Your monitor  has already been registered assigning a specific monitor serial # to you.  Billing and Patient Assistance Program Information  We have supplied [employer] with any of your insurance information on file for billing purposes. [employer] offers a sliding scale Patient Assistance Program for patients that do not have  insurance, or whose insurance does not completely cover the cost of the ZIO monitor.  You must apply for the Patient Assistance Program to qualify for this discounted rate.  To apply, please call [employer] at [phone], select option 4, select option 2, ask to apply for  Patient Assistance Program. Meredeth Ide will ask your household income, and how many people   are in your household. They will quote your out-of-pocket cost based on that information.  [employer] will also be able to set up a 68-month, interest-free payment plan if needed.  Applying the monitor   Shave hair from upper left chest.  Hold abrader disc by orange tab. Rub abrader in 40 strokes over the upper left chest as  indicated in your monitor instructions.  Clean area with 4 enclosed alcohol pads. Let dry.  Apply patch as indicated in monitor instructions. Patch will be placed under collarbone on left  side of chest with arrow pointing upward.  Rub patch adhesive wings for 2 minutes. Remove white label marked "1". Remove the white  label marked "2". Rub patch adhesive wings for 2 additional minutes.  While looking in a mirror, press and release button in center of patch. A small green light will  flash 3-4 times. This will be your only indicator that the monitor has been turned on.  Do not shower for the first 24 hours. You may shower after the first 24 hours.  Press the button if you feel a symptom. You will hear a small click. Record Date, Time and  Symptom in the Patient Logbook.  When you are ready to remove the patch, follow instructions on the last 2 pages of Patient  Logbook. Stick patch monitor onto the last page of Patient Logbook.  Place Patient Logbook in the blue and white box. Use locking tab on box and tape box closed  securely. The blue and white box  has prepaid postage on it. Please place it in the mailbox as  soon as possible. Your physician should have your test results approximately 7 days after the  monitor has been mailed back to [facility].  Call [facility] Customer Care at [phone] if you have questions regarding  your ZIO XT patch monitor. Call them immediately if you see an orange light blinking on your  monitor.  If your monitor falls off in less than 4 days, contact our Monitor department at [phone].  If your monitor becomes loose or  falls off after 4 days call [employer] at [phone] for  suggestions on securing your monitor    Follow-Up: At [facility], you and your health needs are our priority.  As part of our continuing mission to provide you with exceptional heart care, we have created designated Provider Care Teams.  These Care Teams include your primary Cardiologist (physician) and Advanced Practice Providers (APPs -  Physician Assistants and Nurse Practitioners) who all work together to provide you with the care you need, when you need it.  We recommend signing up for the patient portal called "MyChart".  Sign up information is provided on this After Visit Summary.  MyChart is used to connect with patients for Virtual Visits (Telemedicine).  Patients are able to view lab/test results, encounter notes, upcoming appointments, etc.  Non-urgent messages can be sent to your provider as well.   To learn more about what you can do with MyChart, go to [email or web address].    Your next appointment:   3 month(s)  Provider:   Marjie Skiff, PA-C

## 2023-04-15 ENCOUNTER — Encounter: Payer: Self-pay | Admitting: Cardiology

## 2023-04-17 ENCOUNTER — Ambulatory Visit: Payer: 59 | Attending: Cardiology

## 2023-04-17 DIAGNOSIS — R002 Palpitations: Secondary | ICD-10-CM

## 2023-04-17 NOTE — Progress Notes (Unsigned)
Enrolled for [employer] to mail a ZIO XT long term holter monitor to the patients address on file.  

## 2023-04-23 DIAGNOSIS — R002 Palpitations: Secondary | ICD-10-CM

## 2023-05-11 NOTE — Telephone Encounter (Signed)
 Per Dr.  Vear,  The episodes sound most like sleep apnea.  This has been a few years since she was evaluated for sleep issues, lets order a home sleep study for OSA.  If she does have sleep apnea, I would recommend that she be treated with CPAP  pt was informed and was ok to proceed with the HST.  Order placed.  Pt to be called once insurance authorization is done.  She verbalized understanding.

## 2023-05-11 NOTE — Telephone Encounter (Signed)
 I called pt due to her mychart message. Since her last message 04-14-2023 she has seen cardiology and had CM results pending.  Had labs per [facility] normal 1.26.  She is very concerned as she has since 04-10-2023, this is her message:   She did have OSA back in 2021, but had weight loss surgery has resolved.   There has been no recent trauma,  she is only taking lamotrigine  150-75-150, stopped the vyvanse .  I relayed that AmyNP is out, can send message for input with Dr. Vear then call her back.  She appreciated call back.   The last 4 nights I have wokeup multiple times during the night feeling like I cant breath. My heart is pounding, I feel like my throat is closing up and an overwhelming feeling like I'm going to die because i can not get enough air , or so it feels.This only happens when I'm sleeping. I'm so exhausted and feel like I'm going to have grand mal seizures due to the lack of sleep. I have read this could be possible panic attacks that only happen at night. I have never experienced a panic attack so I can not say if it is or isn't. I'm not sure if this is a neurological issue.

## 2023-05-16 ENCOUNTER — Telehealth: Payer: Self-pay | Admitting: *Deleted

## 2023-05-16 NOTE — Telephone Encounter (Signed)
 The patient has been notified of the result and verbalized understanding.  All questions (if any) were answered. Patient did not want an earlier appointment ,she preferred to states with March 2025. Patient states she will be worked up for sleep apnea by Neurologist. RN informed patient if she would like to seen sooner please contact office. Patient voiced understanding.  Routed information to Primary provider as requested  Gladis Reena GAILS, RN 05/16/2023 3:03 PM

## 2023-05-16 NOTE — Telephone Encounter (Signed)
-----   Message from Lynwood Schilling sent at 05/12/2023  1:29 PM EST ----- There are no dangerous arrhythmias.  There is some SVT that did not necessarily correlate with her symptoms.    She has follow up in March.  I would not change therapy at this point.  If she wants to be seen sooner please move her appt up with an APP.  Call Ms. Riggins-Bogue with the results and send results to [place], Celena, PA-C

## 2023-05-31 ENCOUNTER — Ambulatory Visit: Payer: 59 | Admitting: Neurology

## 2023-05-31 DIAGNOSIS — G4733 Obstructive sleep apnea (adult) (pediatric): Secondary | ICD-10-CM | POA: Diagnosis not present

## 2023-05-31 DIAGNOSIS — F419 Anxiety disorder, unspecified: Secondary | ICD-10-CM

## 2023-05-31 DIAGNOSIS — Z8669 Personal history of other diseases of the nervous system and sense organs: Secondary | ICD-10-CM

## 2023-05-31 DIAGNOSIS — F518 Other sleep disorders not due to a substance or known physiological condition: Secondary | ICD-10-CM

## 2023-06-12 ENCOUNTER — Encounter: Payer: Self-pay | Admitting: Neurology

## 2023-06-12 NOTE — Progress Notes (Signed)
     [facility]  HOME SLEEP STUDY  STUDY DATE: 06/06/2023 PATIENT NAME: BONNITA NEWBY DOB: 1985-08-31 MRN: [id number]  ORDERING CLINICIAN: Richard A. Epimenio Foot, MD, PhD INTERPRETING CLINICIAN: Richard A. Sater, MD. PhD   CLINICAL INFORMATION: 38 year old woman with history of sleep apnea and hypnic jerks  IMPRESSION:  Normal sleep efficiency Minimal obstructive sleep apnea with pAHI 3% of 4.9.Marland Kitchen  There was mild OSA during supine sleep with supine pAHI 3% of 11.3/h   RECOMMENDATION: The obstructive sleep apnea was not severe enough to recommend CPAP therapy.  Most of the events occurred during supine sleep, consider positional therapy to encourage side sleeping Follow-up with Dr. Epimenio Foot   INTERPRETING PHYSICIAN:   Pearletha Furl. Epimenio Foot, MD, PhD, [facility] Certified in Neurology, Clinical Neurophysiology, Sleep Medicine, Pain Medicine and Neuroimaging  [facility] [address], [address] [place], [place] [address] [phone]

## 2023-06-16 ENCOUNTER — Other Ambulatory Visit (HOSPITAL_BASED_OUTPATIENT_CLINIC_OR_DEPARTMENT_OTHER): Payer: Self-pay

## 2023-06-16 MED ORDER — LISDEXAMFETAMINE DIMESYLATE 20 MG PO CHEW
20.0000 mg | CHEWABLE_TABLET | Freq: Every day | ORAL | 0 refills | Status: DC
  Filled 2023-06-16: qty 30, 30d supply, fill #0

## 2023-07-12 ENCOUNTER — Ambulatory Visit: Payer: 59 | Admitting: Student

## 2023-07-12 ENCOUNTER — Ambulatory Visit: Payer: 59 | Admitting: Cardiology

## 2023-07-18 ENCOUNTER — Other Ambulatory Visit (HOSPITAL_BASED_OUTPATIENT_CLINIC_OR_DEPARTMENT_OTHER): Payer: Self-pay

## 2023-07-18 ENCOUNTER — Encounter (HOSPITAL_BASED_OUTPATIENT_CLINIC_OR_DEPARTMENT_OTHER): Payer: Self-pay | Admitting: Pharmacist

## 2023-07-20 ENCOUNTER — Other Ambulatory Visit (HOSPITAL_BASED_OUTPATIENT_CLINIC_OR_DEPARTMENT_OTHER): Payer: Self-pay

## 2023-07-20 MED ORDER — LISDEXAMFETAMINE DIMESYLATE 20 MG PO CHEW
20.0000 mg | CHEWABLE_TABLET | Freq: Every day | ORAL | 0 refills | Status: DC
  Filled 2023-07-20: qty 30, 30d supply, fill #0

## 2023-08-17 ENCOUNTER — Other Ambulatory Visit (HOSPITAL_BASED_OUTPATIENT_CLINIC_OR_DEPARTMENT_OTHER): Payer: Self-pay

## 2023-08-18 ENCOUNTER — Other Ambulatory Visit (HOSPITAL_BASED_OUTPATIENT_CLINIC_OR_DEPARTMENT_OTHER): Payer: Self-pay

## 2023-08-18 MED ORDER — LISDEXAMFETAMINE DIMESYLATE 20 MG PO CHEW
20.0000 mg | CHEWABLE_TABLET | Freq: Every day | ORAL | 0 refills | Status: DC
  Filled 2023-08-18: qty 30, 30d supply, fill #0

## 2023-08-21 ENCOUNTER — Other Ambulatory Visit (HOSPITAL_BASED_OUTPATIENT_CLINIC_OR_DEPARTMENT_OTHER): Payer: Self-pay

## 2023-08-22 ENCOUNTER — Other Ambulatory Visit (HOSPITAL_BASED_OUTPATIENT_CLINIC_OR_DEPARTMENT_OTHER): Payer: Self-pay

## 2023-09-12 ENCOUNTER — Other Ambulatory Visit: Payer: Self-pay | Admitting: *Deleted

## 2023-09-12 MED ORDER — LAMOTRIGINE 150 MG PO TABS: ORAL_TABLET | ORAL | 0 refills | Status: DC

## 2023-09-18 ENCOUNTER — Other Ambulatory Visit (HOSPITAL_BASED_OUTPATIENT_CLINIC_OR_DEPARTMENT_OTHER): Payer: Self-pay

## 2023-09-18 MED ORDER — DESVENLAFAXINE SUCCINATE ER 25 MG PO TB24
25.0000 mg | ORAL_TABLET | Freq: Every day | ORAL | 0 refills | Status: DC
  Filled 2023-09-18: qty 30, 30d supply, fill #0

## 2023-09-19 ENCOUNTER — Other Ambulatory Visit (HOSPITAL_BASED_OUTPATIENT_CLINIC_OR_DEPARTMENT_OTHER): Payer: Self-pay

## 2023-09-20 ENCOUNTER — Other Ambulatory Visit (HOSPITAL_BASED_OUTPATIENT_CLINIC_OR_DEPARTMENT_OTHER): Payer: Self-pay

## 2023-09-21 ENCOUNTER — Other Ambulatory Visit (HOSPITAL_BASED_OUTPATIENT_CLINIC_OR_DEPARTMENT_OTHER): Payer: Self-pay

## 2023-09-21 ENCOUNTER — Other Ambulatory Visit: Payer: Self-pay

## 2023-09-21 MED ORDER — LISDEXAMFETAMINE DIMESYLATE 60 MG PO CHEW
60.0000 mg | CHEWABLE_TABLET | Freq: Every day | ORAL | 0 refills | Status: DC
  Filled 2023-09-21: qty 30, 30d supply, fill #0

## 2023-09-22 ENCOUNTER — Other Ambulatory Visit (HOSPITAL_BASED_OUTPATIENT_CLINIC_OR_DEPARTMENT_OTHER): Payer: Self-pay

## 2023-09-27 ENCOUNTER — Other Ambulatory Visit (HOSPITAL_BASED_OUTPATIENT_CLINIC_OR_DEPARTMENT_OTHER): Payer: Self-pay

## 2023-10-18 ENCOUNTER — Other Ambulatory Visit: Payer: Self-pay

## 2023-10-22 ENCOUNTER — Other Ambulatory Visit (HOSPITAL_BASED_OUTPATIENT_CLINIC_OR_DEPARTMENT_OTHER): Payer: Self-pay

## 2023-10-25 ENCOUNTER — Other Ambulatory Visit (HOSPITAL_BASED_OUTPATIENT_CLINIC_OR_DEPARTMENT_OTHER): Payer: Self-pay

## 2023-10-25 ENCOUNTER — Encounter: Payer: Self-pay | Admitting: Neurology

## 2023-10-25 MED ORDER — LAMOTRIGINE 150 MG PO TABS: ORAL_TABLET | ORAL | 0 refills | Status: DC

## 2023-10-25 MED ORDER — LISDEXAMFETAMINE DIMESYLATE 60 MG PO CHEW
60.0000 mg | CHEWABLE_TABLET | Freq: Every day | ORAL | 0 refills | Status: DC
  Filled 2023-10-25 (×2): qty 30, 30d supply, fill #0

## 2023-10-25 NOTE — Telephone Encounter (Signed)
 Last seen on 09/26/22 Follow up scheduled on 05/22/24

## 2023-10-26 ENCOUNTER — Other Ambulatory Visit (HOSPITAL_BASED_OUTPATIENT_CLINIC_OR_DEPARTMENT_OTHER): Payer: Self-pay

## 2023-11-14 NOTE — Progress Notes (Unsigned)
 No chief complaint on file.   HISTORY OF PRESENT ILLNESS:  11/14/23 ALL:  Brooke [name] returns for follow up for JME. She was last seen 09/2022 and doing well on lamotrigine  150mg  twice daily with extra 75mg  dose as needed (about 5 times a month) during menstrual cycle. Mr Brooke [name] called 11/27/2022 reporting prolonged post ictal state following breakthrough seizure. [name]  was called in for her. She was transferred by EMS to [facility] for eval. Lamotrigine  level normal. EEG was not ordered in ER. I ordered EEG thorugh [facility] but she has not had this performed. We increased ltg add 75mg  lunch dose, everyday, and continued 150mg  morning and evening.   Since,   09/26/22 ALL (Mychart):  Brooke [name] returns for follow up for JME. She was last seen 03/2022 and we increased lamotrigine  to 150mg  QAM, 75mg  at lunch (for about a week around menstrual cycle) and 150mg  QPM. Since, she reports doing well from a seizure standpoint. No seizure events since last visit. She has not used lorazepam  since last visit. She was concerned of brian fog and word finding difficulty. Since stopping, these concerns are much better. She continues to have intermittent difficulty getting to sleep. She has taken melatonin but it makes her feel groggy the next day. Unsure of dose. She stays active with her two year old but no regular physical exercise.   She has been more tired. Noticed toe nails were bluish colored. Ferritin was 8. She is taking OTC iron and vitamin C supplements. She is followed by Brooke [name]. She is s/p gastric bypass in 01/2019.   03/24/2022 ALL:  Brooke [name] is a 38 y.o. female here today for follow up for juvenile myoclonic epilepsy. She continues lamotrigine  150mg  BID and lorazepam  1mg  QHS. Last refilled 03/06/2022.   She is doing fairly well. She denies tonic clonic seizure activity. She reports feeling very tired and drained around the time of her menstrual cycle. For about 2.5 weeks she will take 1/2 tablet  of lamotrigine  for increased myoclonic jerks. She does not feel extra dose makes her more tired and it does seem to settle jerking. She reports being told she was anemic but was able to focus on diet changes. She has not needed infusion therapy.   She continues Vyvanse  30mg  twice daily managed by Brooke [name], nutritionist.   HISTORY (copied from Brooke [name] previous note)  Brooke [name], at [facility] with epilepsy.   Update 09/09/2021: Her JME has generally done well on lamotrigine  150 mg po bid (and tid on days she has myoclonic jerks).   Her last GTC was 05/2020.     She delivered a healthy girl 05/2021 and also has a 7yo son.   She continued lamotrigine  150 mg po bid during the pregnancy.      She had no post-partum depression with this pregnancy but did have after her last pregnancy .    She has not had any more GTC seizures.    She is on lamotrigine  100 mg po bid    She had bariatric surgery (was 270 pounds - now 172lbs).   She was referred to nutrition and her weight is stable and she is eating better.      She has anxiety, esp at night.   Low dose lorazepam  helps her quickly.   She takes just 1/2 pill twice a day.      She no longer has migraine headaches since sleeping longer.    She is now sleeping 9-10 hours  because she gets sleepy at 7 pm and goes to bed at wakes up to get son    Her first cousin has petit mal seizures.     Reference:  A post-partum study ([place] and Sander LE) found clinically insignificant amounts of lorazepam  in breastmilk even at a dose of 2.5 mg twice daily for the first 5 days post-natally.     Some lamotrigine  will enter breastmilk but her potential of benefit outweighs this risk.      Seizure history She was diagnosed with juvenile myoclonic epilepsy at age 14.   At that time, she had episodes of myoclonus, especially in the mornings.  She noted that going back to sleep would help.   She saw Brooke. Susen and was placed  on Depakote but gained weight and lost hair.  On a combination of lamotrigine  and Depakote, she did better with decent tolerability and no further hair loss.   At some point, she was switched to Topamax  which helped the best (no myoclonus) but caused tingling and altered taste.    Myoclonus always did better if she slept better.  She had a flurry of myoclonic jerks associated with hr son's birth 6 years ago  She never had a definite absence seizure.   However, Two years ago, she had a possible absence event.   She had a conversation at work but does not recall several minutes.       Her first generalized tonic clonic seizure was 05/19/2020.   She was unable to sleep due to feeling very cold.  She decided to take a warm bath.  While getting the tub ready, she started convulsing.   The husband who was in different part of the house heard the noise and ran in and saw her convulsing for about 1 minute.    He got her on the bathroom floor.   He called 911 after she stopped seizing.   THis was about 1030 am. She went to the ED.   she was conversant after the seizure but she does not recall that period of time.  About noon, in the ED, she had a second GTC seizure.   She was started on Keppra  and lamotrigine .   She does not recall the rest of that day and has spotty memory of the next few days.   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      Studies: CT scan 05/19/2020 was normal.   Actual images were reviewed and I concur.  Labs showed low normal Mg and mildly low Calcium  and albumin.     EEG 10/29/2014 showed 1-3 second bursts of generalized irregular 3-4 Hz spike and polyspike and wave discharges with frontal predominance.   REVIEW OF SYSTEMS: Out of a complete 14 system review of symptoms, the patient complains only of the following symptoms, fatigue, myoclonic jerks and all other reviewed systems are negative.   ALLERGIES: Allergies  Allergen Reactions    Nsaids     Post gastric bypass     HOME MEDICATIONS: Outpatient Medications Prior to Visit  Medication Sig Dispense Refill   desvenlafaxine  (PRISTIQ ) 25 MG 24 hr tablet Take 1 tablet (25 mg total) by mouth daily. 30 tablet 0   lamoTRIgine  (LAMICTAL ) 150 MG tablet Take 1 tablet (150 mg total) by mouth 2 (two) times daily AND 0.5 tablets (75 mg total) daily with lunch. 75 tablet 0   Lisdexamfetamine Dimesylate  (VYVANSE ) 60 MG CHEW  Chew 1 tablet (60 mg total) by mouth daily. 30 tablet 0   Lisdexamfetamine Dimesylate  20 MG CHEW Chew 1 tablet (20 mg total) by mouth daily. 30 tablet 0   Lisdexamfetamine Dimesylate  60 MG CHEW Chew 1 tablet (60 mg total) by mouth daily. (Patient not taking: Reported on 04/14/2023) 30 tablet 0   LORazepam  (ATIVAN ) 2 MG tablet Take 1 tablet (2 mg total) by mouth at bedtime as needed. (Patient not taking: Reported on 04/14/2023) 30 tablet 0   Magnesium  400 MG CAPS Take 400 mg by mouth daily in the afternoon. (Patient not taking: Reported on 04/14/2023)     Midazolam  ([name] ) 5 MG/0.1ML SOLN Place 5 mg into the nose as needed (only once a day). 1 each 2   Multiple Vitamin (MULTI VITAMIN PO) Take 1 tablet by mouth daily. Bariatric mutlti vitamin (Patient not taking: Reported on 04/14/2023)     VYVANSE  40 MG CHEW Chew 1 tablet by mouth every morning. (Patient not taking: Reported on 04/14/2023)     No facility-administered medications prior to visit.     PAST MEDICAL HISTORY: Past Medical History:  Diagnosis Date   Anxiety    Arthritis    Back pain    Depression    Epilepsy (HCC)    GAD (generalized anxiety disorder)    GERD (gastroesophageal reflux disease)    History of concussion age 30   per pt hit in head w/ softball , forehead area,  no loc had multiple stitches   Hypertension    Infertility, female    MDD (major depressive disorder)    Migraines    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 52   (06-15-2020  per pt  no seizure since 05-19-2020)   neurologist--- Brooke vear--- pt previously seen by Brooke hickling/ Brooke cyrena,  pt was referred to Brooke sater from ED visit / overnight stay for pt's first two generalized tonic clonic seizure's , pt had stated unable to sleep (second one was witnessed in ED);  per Brooke sater note in epic 06-09-2020 pt is back to baseline with intermittant myoclinic jerks with medication adjustement, normal head CT 05-19-2020   PCOS (polycystic ovarian syndrome)    Prediabetes    Sleep apnea      PAST SURGICAL HISTORY: Past Surgical History:  Procedure Laterality Date   CERVICAL CERCLAGE N/A 06/26/2014   Procedure: CERCLAGE CERVICAL;  Surgeon: Charlie JINNY Flowers, MD;  Location: [facility];  Service: Gynecology;  Laterality: N/A;   CERVICAL CERCLAGE N/A 11/17/2020   Procedure: McDonald CERVICAL CERCLAGE;  Surgeon: Flowers Charlie, MD;  Location: [facility];  Service: Gynecology;  Laterality: N/A;  EDD: 05/25/21   DILATATION & CURETTAGE/HYSTEROSCOPY WITH MYOSURE N/A 02/16/2017   Procedure: DILATATION & CURETTAGE/HYSTEROSCOPY WITH MYOSURE RESECTION OF ENDOMETRIAL POLYP;  Surgeon: Flowers Charlie, MD;  Location: [facility];  Service: Gynecology;  Laterality: N/A;   DILATION AND EVACUATION N/A 06/17/2020   Procedure: DILATATION AND EVACUATION;  Surgeon: Flowers Charlie, MD;  Location: [facility];  Service: Gynecology;  Laterality: N/A;   ROUX-EN-Y GASTRIC BYPASS  02-03-2020  @ [facility]   LAPAROSCOPIC   TONSILLECTOMY  CHILD   TYMPANOSTOMY TUBE PLACEMENT Bilateral child   WISDOM TOOTH EXTRACTION  age 10     FAMILY HISTORY: Family History  Problem Relation Age of Onset   Diabetes Mother    Hypertension Mother    Heart disease Mother    Obesity Mother    Hypertension Father    Obesity  Father    Alcohol abuse Maternal Grandmother    Hearing loss Paternal Grandfather      SOCIAL HISTORY: Social History   Socioeconomic History   Marital status: Married    Spouse name: Not on file    Number of children: 1   Years of education: Not on file   Highest education level: Not on file  Occupational History   Occupation: Stay at home mother  Tobacco Use   Smoking status: Never   Smokeless tobacco: Never  Vaping Use   Vaping status: Never Used  Substance and Sexual Activity   Alcohol use: No   Drug use: Never   Sexual activity: Yes    Partners: Male    Birth control/protection: None  Other Topics Concern   Not on file  Social History Narrative   Married: no kids   Regular exercise: not at this time   Caffeine  use: no   Lives with husband   Social Drivers of Corporate investment banker Strain: Low Risk  (11/23/2019)   Received from [facility]   Overall Financial Resource Strain (CARDIA)    Difficulty of Paying Living Expenses: Not very hard  Food Insecurity: Food Insecurity Present (11/23/2019)   Received from [facility]   Hunger Vital Sign    Within the past 12 months, you worried that your food would run out before you got the money to buy more.: Sometimes true    Within the past 12 months, the food you bought just didn't last and you didn't have money to get more.: Never true  Transportation Needs: No Transportation Needs (11/23/2019)   Received from [facility] - Transportation    In the past 12 months, has lack of transportation kept you from medical appointments or from getting medications?: No    Lack of Transportation (Non-Medical): No  Physical Activity: Inactive (11/23/2019)   Received from [facility]   Exercise Vital Sign    On average, how many days per week do you engage in moderate to strenuous exercise (like a brisk walk)?: 0 days    On average, how many minutes do you engage in exercise at this level?: 0 min  Stress: Stress Concern Present (11/23/2019)   Received from [facility] - Occupational Stress  Questionnaire    Feeling of Stress : Very much  Social Connections: Unknown (09/06/2021)   Received from [facility]   Social Network    Social Network: Not on file  Intimate Partner Violence: Unknown (08/12/2021)   Received from [facility]   HITS    Physically Hurt: Not on file    Insult or Talk Down To: Not on file    Threaten Physical Harm: Not on file    Scream or Curse: Not on file     PHYSICAL EXAM  There were no vitals filed for this visit.   There is no height or weight on file to calculate BMI.  Generalized: Well developed, in no acute distress  Cardiology: normal rate and rhythm, no murmur auscultated  Respiratory: clear to auscultation bilaterally    Neurological examination  Mentation: Alert oriented to time, place, history taking. Follows all commands speech and language fluent Cranial nerve II-XII: Pupils were equal round reactive to light. Extraocular movements were full, visual field were full on confrontational test. Facial sensation and strength were normal. Head turning and shoulder shrug  were normal and symmetric. Motor: The motor testing reveals 5 over 5 strength of all 4 extremities. Good symmetric motor tone is noted throughout.  Gait and station: Gait is normal.    DIAGNOSTIC DATA (LABS, IMAGING, TESTING) - I reviewed patient records, labs, notes, testing and imaging myself where available.  Lab Results  Component Value Date   WBC 8.5 11/27/2022   HGB 13.9 11/27/2022   HCT 40.6 11/27/2022   MCV 91.6 11/27/2022   PLT 282 11/27/2022      Component Value Date/Time   NA 135 11/27/2022 2121   NA 139 01/13/2021 1604   K 3.8 11/27/2022 2121   CL 101 11/27/2022 2121   CO2 25 11/27/2022 2121   GLUCOSE 105 (H) 11/27/2022 2121   BUN 18 11/27/2022 2121   BUN 12 01/13/2021 1604   CREATININE 0.78 11/27/2022 2121   CALCIUM  8.8 (L) 11/27/2022 2121   PROT 6.7 11/27/2022 2121   PROT 6.0 01/13/2021 1604   ALBUMIN 3.9 11/27/2022 2121   ALBUMIN 4.0  01/13/2021 1604   AST 22 11/27/2022 2121   ALT 29 11/27/2022 2121   ALKPHOS 59 11/27/2022 2121   BILITOT 0.7 11/27/2022 2121   BILITOT <0.2 01/13/2021 1604   GFRNONAA >60 11/27/2022 2121   GFRAA >60 09/26/2014 1234   Lab Results  Component Value Date   CHOL 151 07/30/2020   HDL 61 07/30/2020   LDLCALC 74 07/30/2020   TRIG 83 07/30/2020   CHOLHDL 2.5 07/30/2020   Lab Results  Component Value Date   HGBA1C 4.9 01/13/2021   Lab Results  Component Value Date   VITAMINB12 1,430 (H) 01/13/2021   Lab Results  Component Value Date   TSH 0.843 01/13/2021        No data to display               No data to display           ASSESSMENT AND PLAN  38 y.o. year old female  has a past medical history of Anxiety, Arthritis, Back pain, Depression, Epilepsy (HCC), GAD (generalized anxiety disorder), GERD (gastroesophageal reflux disease), History of concussion (age 34), Hypertension, Infertility, female, MDD (major depressive disorder), Migraines, Nonintractable juvenile myoclonic epilepsy without status epilepticus (HCC) (age 31   (06-15-2020  per pt no seizure since 05-19-2020)), PCOS (polycystic ovarian syndrome), Prediabetes, and Sleep apnea. here with    No diagnosis found.  She is doing fairly well. She does note more myoclonic activity around her menstrual cycle and feels extra 75mg  dose at lunch helps. I will have her increase lamotrigine  to 150mg  QAM, 75mg  at lunch and 150mg  at bedtime. Could consider lamotrigine  ER if not well tolerated. We will check lamotrigine  level, today. May continue lorazepam  1mg  QHS as needed. Seizure precautions advised. Healthy lifestyle habits encouraged. She will follow up in 6 months.    No orders of the defined types were placed in this encounter.     No orders of the defined types were placed in this encounter.    Greig Forbes, MSN, FNP-C 11/14/2023, 9:28 AM  [facility] [address] [place], [place]  [address] [phone]

## 2023-11-14 NOTE — Patient Instructions (Incomplete)
 Below is our plan:  We will continue lamotrigine  150mg  in the morning, 75mg  at lunch and 150mg  at dinner. Consider reaching out to your dentist regarding a dental appliance to help manage sleep apnea. I will send your note to Dr Vear for review.   Monitor the numbness of your hand and foot. It could be related to high B12. Continue to monitor levels with your nutritionist. Let me know if it isn't getting better and we will have Dr Vear take a look.   Please make sure you are consistent with timing of seizure medication. I recommend annual visit with primary care provider (PCP) for complete physical and routine blood work. I recommend daily intake of vitamin D (400-800iu) and calcium  (800-1000mg ) for bone health. Discuss Dexa screening with PCP.   According to [place] law, you can not drive unless you are seizure / syncope free for at least 6 months and under physician's care.  Please maintain precautions. Do not participate in activities where a loss of awareness could harm you or someone else. No swimming alone, no tub bathing, no hot tubs, no driving, no operating motorized vehicles (cars, ATVs, motocycles, etc), lawnmowers, power tools or firearms. No standing at heights, such as rooftops, ladders or stairs. Avoid hot objects such as stoves, heaters, open fires. Wear a helmet when riding a bicycle, scooter, skateboard, etc. and avoid areas of traffic. Set your water heater to 120 degrees or less.  SUDEP is the sudden, unexpected death of someone with epilepsy, who was otherwise healthy. In SUDEP cases, no other cause of death is found when an autopsy is done. Each year, more than 1 in 1,000 people with epilepsy die from SUDEP. This is the leading cause of death in people with uncontrolled seizures. Until further answers are available, the best way to prevent SUDEP is to lower your risk by controlling seizures. Research has found that people with all types of epilepsy that experience convulsive seizures  can be at risk.  Please make sure you are staying well hydrated. I recommend 50-60 ounces daily. Well balanced diet and regular exercise encouraged. Consistent sleep schedule with 6-8 hours recommended.   Please continue follow up with care team as directed.   Follow up with Dr Vear in 6 months   You may receive a survey regarding today's visit. I encourage you to leave honest feed back as I do use this information to improve patient care. Thank you for seeing me today!

## 2023-11-15 ENCOUNTER — Encounter: Payer: Self-pay | Admitting: Family Medicine

## 2023-11-15 ENCOUNTER — Ambulatory Visit: Admitting: Family Medicine

## 2023-11-15 VITALS — BP 125/95 | HR 89 | Ht 65.0 in | Wt 168.5 lb

## 2023-11-15 DIAGNOSIS — G40B09 Juvenile myoclonic epilepsy, not intractable, without status epilepticus: Secondary | ICD-10-CM

## 2023-11-15 MED ORDER — LAMOTRIGINE 150 MG PO TABS: ORAL_TABLET | ORAL | 3 refills | Status: AC

## 2023-11-27 ENCOUNTER — Other Ambulatory Visit (HOSPITAL_BASED_OUTPATIENT_CLINIC_OR_DEPARTMENT_OTHER): Payer: Self-pay

## 2023-11-28 ENCOUNTER — Other Ambulatory Visit (HOSPITAL_BASED_OUTPATIENT_CLINIC_OR_DEPARTMENT_OTHER): Payer: Self-pay

## 2023-11-28 MED ORDER — LISDEXAMFETAMINE DIMESYLATE 60 MG PO CHEW
60.0000 mg | CHEWABLE_TABLET | Freq: Every day | ORAL | 0 refills | Status: DC
  Filled 2023-11-28: qty 30, 30d supply, fill #0

## 2024-01-01 ENCOUNTER — Other Ambulatory Visit (HOSPITAL_BASED_OUTPATIENT_CLINIC_OR_DEPARTMENT_OTHER): Payer: Self-pay

## 2024-01-01 MED ORDER — LISDEXAMFETAMINE DIMESYLATE 60 MG PO CHEW
60.0000 mg | CHEWABLE_TABLET | Freq: Every day | ORAL | 0 refills | Status: DC
  Filled 2024-01-01: qty 30, 30d supply, fill #0

## 2024-01-16 ENCOUNTER — Other Ambulatory Visit (HOSPITAL_BASED_OUTPATIENT_CLINIC_OR_DEPARTMENT_OTHER): Payer: Self-pay

## 2024-01-16 MED ORDER — ZEPBOUND 2.5 MG/0.5ML ~~LOC~~ SOAJ
2.5000 mg | SUBCUTANEOUS | 0 refills | Status: DC
  Filled 2024-01-16: qty 2, 28d supply, fill #0

## 2024-01-20 ENCOUNTER — Other Ambulatory Visit (HOSPITAL_BASED_OUTPATIENT_CLINIC_OR_DEPARTMENT_OTHER): Payer: Self-pay

## 2024-02-02 ENCOUNTER — Other Ambulatory Visit (HOSPITAL_BASED_OUTPATIENT_CLINIC_OR_DEPARTMENT_OTHER): Payer: Self-pay

## 2024-02-05 ENCOUNTER — Other Ambulatory Visit (HOSPITAL_BASED_OUTPATIENT_CLINIC_OR_DEPARTMENT_OTHER): Payer: Self-pay

## 2024-02-06 ENCOUNTER — Other Ambulatory Visit (HOSPITAL_BASED_OUTPATIENT_CLINIC_OR_DEPARTMENT_OTHER): Payer: Self-pay

## 2024-02-06 MED ORDER — LISDEXAMFETAMINE DIMESYLATE 60 MG PO CHEW
60.0000 mg | CHEWABLE_TABLET | Freq: Every day | ORAL | 0 refills | Status: DC
  Filled 2024-02-06: qty 30, 30d supply, fill #0

## 2024-02-12 ENCOUNTER — Other Ambulatory Visit (HOSPITAL_BASED_OUTPATIENT_CLINIC_OR_DEPARTMENT_OTHER): Payer: Self-pay

## 2024-02-12 MED ORDER — ZEPBOUND 2.5 MG/0.5ML ~~LOC~~ SOAJ
2.5000 mg | SUBCUTANEOUS | 0 refills | Status: AC
  Filled 2024-02-12: qty 2, 28d supply, fill #0

## 2024-02-28 ENCOUNTER — Encounter: Payer: Self-pay | Admitting: Family Medicine

## 2024-03-03 ENCOUNTER — Other Ambulatory Visit (HOSPITAL_BASED_OUTPATIENT_CLINIC_OR_DEPARTMENT_OTHER): Payer: Self-pay

## 2024-03-07 ENCOUNTER — Other Ambulatory Visit (HOSPITAL_BASED_OUTPATIENT_CLINIC_OR_DEPARTMENT_OTHER): Payer: Self-pay

## 2024-03-07 MED ORDER — LISDEXAMFETAMINE DIMESYLATE 60 MG PO CHEW
60.0000 mg | CHEWABLE_TABLET | Freq: Every day | ORAL | 0 refills | Status: DC
  Filled 2024-03-07: qty 30, 30d supply, fill #0

## 2024-04-02 ENCOUNTER — Ambulatory Visit: Admitting: Family Medicine

## 2024-04-02 ENCOUNTER — Encounter: Payer: Self-pay | Admitting: Family Medicine

## 2024-04-02 VITALS — BP 118/76 | HR 83 | Ht 64.0 in | Wt 163.0 lb

## 2024-04-02 DIAGNOSIS — Z9884 Bariatric surgery status: Secondary | ICD-10-CM

## 2024-04-02 DIAGNOSIS — Z6827 Body mass index (BMI) 27.0-27.9, adult: Secondary | ICD-10-CM

## 2024-04-02 DIAGNOSIS — F50811 Binge eating disorder, moderate: Secondary | ICD-10-CM

## 2024-04-02 DIAGNOSIS — L659 Nonscarring hair loss, unspecified: Secondary | ICD-10-CM

## 2024-04-02 DIAGNOSIS — G40909 Epilepsy, unspecified, not intractable, without status epilepticus: Secondary | ICD-10-CM

## 2024-04-02 DIAGNOSIS — G479 Sleep disorder, unspecified: Secondary | ICD-10-CM

## 2024-04-02 DIAGNOSIS — E663 Overweight: Secondary | ICD-10-CM

## 2024-04-02 DIAGNOSIS — Z8639 Personal history of other endocrine, nutritional and metabolic disease: Secondary | ICD-10-CM

## 2024-04-06 ENCOUNTER — Other Ambulatory Visit (HOSPITAL_BASED_OUTPATIENT_CLINIC_OR_DEPARTMENT_OTHER): Payer: Self-pay

## 2024-04-06 DIAGNOSIS — K580 Irritable bowel syndrome with diarrhea: Secondary | ICD-10-CM | POA: Insufficient documentation

## 2024-04-06 MED ORDER — LISDEXAMFETAMINE DIMESYLATE 60 MG PO CHEW
60.0000 mg | CHEWABLE_TABLET | Freq: Every day | ORAL | 0 refills | Status: DC
  Filled 2024-04-06 – 2024-04-08 (×2): qty 30, 30d supply, fill #0

## 2024-04-06 MED ORDER — ONDANSETRON 4 MG PO TBDP
4.0000 mg | ORAL_TABLET | Freq: Three times a day (TID) | ORAL | 0 refills | Status: AC | PRN
  Filled 2024-04-06: qty 20, 7d supply, fill #0

## 2024-04-06 NOTE — Progress Notes (Addendum)
 "  Assessment & Plan:   1. Alopecia (Primary) Comments: Patient with alopecia. Wears wigs. Okay to send letter for her to receive due to medical necessity.  2. Binge eating disorder, moderate Comments: Controlled with Vyvanse . Continue. - Lisdexamfetamine  Dimesylate (VYVANSE ) 60 MG CHEW; Chew 1 tablet (60 mg total) by mouth daily.  Dispense: 30 tablet; Refill: 0  3. Seizure disorder [facility]) Comments: Brooke [name] has Neuro. No in-house sleep study completed. Concern for sleep-related seizures. I spoke with Dr. Harl at [facility] to eval her. Will send.  4. Sleep disorder Comments: As above. - Ambulatory referral to Sleep Studies  5. History of Roux-en-Y gastric bypass  6. History of obesity  7. Overweight with body mass index (BMI) of 27 to 27.9 in adult - ondansetron  (ZOFRAN -ODT) 4 MG disintegrating tablet; Take 1 tablet (4 mg total) by mouth every 8 (eight) hours as needed for nausea or vomiting.  Dispense: 20 tablet; Refill: 0  Brooke Shutter, DO, MS, FAAFP, Dipl. Brooke [name] [facility] [address] [place], [address] Dept: [phone] Dept Fax: [phone]  Subjective:   Patient is well-known to me from previous care setting and is establishing care in this system with me as PCP. Prior records reviewed when available. Chart updated today with reconciliation of problem list, medications, allergies, and relevant history. Preventive care and chronic disease status reviewed. Portions of historical chart may remain incomplete; will update on an ongoing basis as clinically indicated.  Review of Systems: Negative, with the exception of above mentioned in HPI.  Current Outpatient Medications:    Ascorbic Acid (VITAMIN C PO), Take 1,700 mg by mouth daily., Disp: , Rfl:    ferrous sulfate ER (IRON SLOW RELEASE) 142 (45 Fe) MG TBCR tablet, Take 45 mg by mouth daily., Disp: , Rfl:    lamoTRIgine  (LAMICTAL ) 150 MG tablet, Take 1 tablet (150 mg  total) by mouth 2 (two) times daily AND 0.5 tablets (75 mg total) daily with lunch., Disp: 225 tablet, Rfl: 3   Multiple Vitamin (MULTI VITAMIN PO), Take 1 tablet by mouth daily. Bariatric mutlti vitamin, Disp: , Rfl:    ondansetron  (ZOFRAN -ODT) 4 MG disintegrating tablet, Take 1 tablet (4 mg total) by mouth every 8 (eight) hours as needed for nausea or vomiting., Disp: 20 tablet, Rfl: 0   tirzepatide  (ZEPBOUND ) 2.5 MG/0.5ML Pen, Inject 2.5 mg into the skin once a week., Disp: 2 mL, Rfl: 0   Docusate Sodium  (DSS) 100 MG CAPS, Take 100 mg by mouth., Disp: , Rfl:    Lisdexamfetamine  Dimesylate (VYVANSE ) 60 MG CHEW, Chew 1 tablet (60 mg total) by mouth daily., Disp: 30 tablet, Rfl: 0   melatonin 1 MG TABS tablet, Take 2 mg by mouth., Disp: , Rfl:    Objective:   BP 118/76 (BP Location: Right Arm, Cuff Size: Normal)   Pulse 83   Ht 5' 4 (1.626 m)   Wt 163 lb (73.9 kg)   LMP 03/06/2024 (Exact Date)   SpO2 99%   BMI 27.98 kg/m   Wt Readings from Last 3 Encounters:  04/02/24 163 lb (73.9 kg)  11/15/23 168 lb 8 oz (76.4 kg)  04/14/23 157 lb 12.8 oz (71.6 kg)   Physical Exam Constitutional:      General: She is not in acute distress.    Appearance: She is well-developed.  HENT:     Head: Normocephalic and atraumatic.  Eyes:     Conjunctiva/sclera: Conjunctivae normal.  Cardiovascular:     Rate  and Rhythm: Normal rate and regular rhythm.     Heart sounds: Normal heart sounds.  Pulmonary:     Effort: Pulmonary effort is normal.     Breath sounds: Normal breath sounds.  Musculoskeletal:     Cervical back: Normal range of motion and neck supple.  Neurological:     General: No focal deficit present.     Mental Status: She is alert and oriented to person, place, and time.  Psychiatric:        Mood and Affect: Mood normal.        Behavior: Behavior normal.   Note: Other outside labs reviewed and will be abstracted. Below labs are the most recent in local EPIC.  Lab Results   Component Value Date   CREATININE 0.78 11/27/2022   BUN 18 11/27/2022   NA 135 11/27/2022   K 3.8 11/27/2022   CL 101 11/27/2022   CO2 25 11/27/2022   Lab Results  Component Value Date   ALT 29 11/27/2022   AST 22 11/27/2022   ALKPHOS 59 11/27/2022   BILITOT 0.7 11/27/2022   Lab Results  Component Value Date   HGBA1C 4.9 01/13/2021   HGBA1C 5.0 07/30/2020   Lab Results  Component Value Date   INSULIN 5.4 07/30/2020   Lab Results  Component Value Date   TSH 0.843 01/13/2021   Lab Results  Component Value Date   CHOL 151 07/30/2020   HDL 61 07/30/2020   LDLCALC 74 07/30/2020   TRIG 83 07/30/2020   CHOLHDL 2.5 07/30/2020   Lab Results  Component Value Date   VD25OH 42.9 01/13/2021   VD25OH 43.3 07/30/2020   Lab Results  Component Value Date   WBC 8.5 11/27/2022   HGB 13.9 11/27/2022   HCT 40.6 11/27/2022   MCV 91.6 11/27/2022   PLT 282 11/27/2022   Lab Results  Component Value Date   IRON 83 01/13/2021   TIBC 333 01/13/2021   FERRITIN 59 01/13/2021   "

## 2024-04-08 ENCOUNTER — Other Ambulatory Visit: Payer: Self-pay

## 2024-04-08 ENCOUNTER — Other Ambulatory Visit (HOSPITAL_BASED_OUTPATIENT_CLINIC_OR_DEPARTMENT_OTHER): Payer: Self-pay

## 2024-04-12 ENCOUNTER — Ambulatory Visit: Admitting: Family Medicine

## 2024-04-30 ENCOUNTER — Ambulatory Visit: Admitting: Family Medicine

## 2024-05-17 ENCOUNTER — Other Ambulatory Visit: Payer: Self-pay | Admitting: Family Medicine

## 2024-05-17 ENCOUNTER — Other Ambulatory Visit (HOSPITAL_BASED_OUTPATIENT_CLINIC_OR_DEPARTMENT_OTHER): Payer: Self-pay

## 2024-05-17 DIAGNOSIS — F50811 Binge eating disorder, moderate: Secondary | ICD-10-CM

## 2024-05-17 MED ORDER — LISDEXAMFETAMINE DIMESYLATE 60 MG PO CHEW
60.0000 mg | CHEWABLE_TABLET | Freq: Every day | ORAL | 0 refills | Status: DC
  Filled 2024-05-17: qty 30, 30d supply, fill #0

## 2024-05-22 ENCOUNTER — Ambulatory Visit: Admitting: Neurology

## 2024-05-27 ENCOUNTER — Other Ambulatory Visit (HOSPITAL_BASED_OUTPATIENT_CLINIC_OR_DEPARTMENT_OTHER): Payer: Self-pay

## 2024-06-13 ENCOUNTER — Other Ambulatory Visit: Payer: Self-pay

## 2024-06-13 ENCOUNTER — Other Ambulatory Visit: Payer: Self-pay | Admitting: Family Medicine

## 2024-06-13 ENCOUNTER — Other Ambulatory Visit (HOSPITAL_BASED_OUTPATIENT_CLINIC_OR_DEPARTMENT_OTHER): Payer: Self-pay

## 2024-06-13 DIAGNOSIS — F50811 Binge eating disorder, moderate: Secondary | ICD-10-CM

## 2024-06-13 MED ORDER — LISDEXAMFETAMINE DIMESYLATE 60 MG PO CHEW
60.0000 mg | CHEWABLE_TABLET | Freq: Every day | ORAL | 0 refills | Status: AC
  Filled 2024-06-13: qty 30, 30d supply, fill #0

## 2024-06-14 ENCOUNTER — Telehealth: Payer: Self-pay

## 2024-06-14 ENCOUNTER — Encounter: Payer: Self-pay | Admitting: Family Medicine

## 2024-06-14 ENCOUNTER — Ambulatory Visit: Admitting: Family Medicine

## 2024-06-14 NOTE — Telephone Encounter (Signed)
 Medication Samples have been provided to the patient.  Drug name: Zepbound        Strength: 2.5mg         Qty: 1 box  LOT: [id number] Exp.Date: 10/04/2025  Dosing instructions: inject 1 pen subcutaneous once weekly  The patient has been instructed regarding the correct time, dose, and frequency of taking this medication, including desired effects and most common side effects.   Aiya Keach 4:04 PM 06/14/2024

## 2024-06-14 NOTE — Progress Notes (Unsigned)
 "  Patient Care Team: Prentiss Frieze, DO as PCP - General (Family Medicine) Lavona Agent, MD as PCP - Cardiology (Cardiology) Vear, Charlie LABOR, MD as PCP - Family Medicine (Neurology)  Subjective   Brooke [name] is a 39 y.o. female presenting for a comprehensive medical examination. She {does/does not:200015} have additional problems to discuss today.   Nutrition Plan: ***. Activity: *** Sleep: Number of hours slept each night: ***. Sleep {ACTION; IS/IS NOT:21021397} restful.  Stress: ***.  Weight Management:   Starting weight: 171 lbs.  Starting date: 10/26/21.  Current weight:  lbs.  Weight change since last encounter: +5 lbs.  Weight change since first encounter: -1 lbs.  Total weight loss percentage: -0.58%.  Body Fat Percentage: 37.1%.   There is no height or weight on file to calculate BMI.   Nutrition Plan: 1200 calories/85 g protein.  Adherence to Nutrition Plan: poor.  Current Anti-Obesity Medication, including off-label: Vyvanse  (for reward-based eating) 20mg  once a day.  Recent Physical Activity: has not been exercising.    {EW OBESITY MED DOCUMENTATION:34479} History of Present Illness   Review of Systems: Negative, with the exception of above mentioned in HPI.  History   Reviewed and updated by clinician on day of visit: allergies, medications, problem list, medical history, surgical history, family history, social history, and previous encounter notes.  {History (Optional):23778}  Medications and Allergies   Show/hide medication list[1] Allergies[2]  Objective   BP 116/70   Pulse 81   Ht 5' 4 (1.626 m)   Wt 163 lb 9.6 oz (74.2 kg)   LMP 06/03/2024 (Approximate)   SpO2 96%   Breastfeeding No   BMI 28.08 kg/m  {Insert last BP/Wt (optional):23777}{See vitals history (optional):1}   No results found.  Physical Exam  {Insert previous labs (optional):23779} {See past labs  Heme  Chem  Endocrine  Serology  Results Review  (optional):1}  Results for orders placed or performed during the hospital encounter of 11/27/22  CBG monitoring, ED   Collection Time: 11/27/22  7:59 PM  Result Value Ref Range   Glucose-Capillary 106 (H) 70 - 99 mg/dL  Urinalysis, Routine w reflex microscopic -Urine, Clean Catch   Collection Time: 11/27/22  9:00 PM  Result Value Ref Range   Color, Urine STRAW (A) YELLOW   APPearance CLEAR CLEAR   Specific Gravity, Urine 1.016 1.005 - 1.030   pH 5.0 5.0 - 8.0   Glucose, UA NEGATIVE NEGATIVE mg/dL   Hgb urine dipstick NEGATIVE NEGATIVE   Bilirubin Urine NEGATIVE NEGATIVE   Ketones, ur 5 (A) NEGATIVE mg/dL   Protein, ur NEGATIVE NEGATIVE mg/dL   Nitrite NEGATIVE NEGATIVE   Leukocytes,Ua NEGATIVE NEGATIVE  CBC with Differential   Collection Time: 11/27/22  9:21 PM  Result Value Ref Range   WBC 8.5 4.0 - 10.5 K/uL   RBC 4.43 3.87 - 5.11 MIL/uL   Hemoglobin 13.9 12.0 - 15.0 g/dL   HCT 59.3 63.9 - 53.9 %   MCV 91.6 80.0 - 100.0 fL   MCH 31.4 26.0 - 34.0 pg   MCHC 34.2 30.0 - 36.0 g/dL   RDW 86.3 88.4 - 84.4 %   Platelets 282 150 - 400 K/uL   nRBC 0.0 0.0 - 0.2 %   Neutrophils Relative % 75 %   Neutro Abs 6.4 1.7 - 7.7 K/uL   Lymphocytes Relative 20 %   Lymphs Abs 1.7 0.7 - 4.0 K/uL   Monocytes Relative 4 %   Monocytes Absolute 0.4 0.1 - 1.0  K/uL   Eosinophils Relative 0 %   Eosinophils Absolute 0.0 0.0 - 0.5 K/uL   Basophils Relative 1 %   Basophils Absolute 0.0 0.0 - 0.1 K/uL   Immature Granulocytes 0 %   Abs Immature Granulocytes 0.02 0.00 - 0.07 K/uL  Comprehensive metabolic panel   Collection Time: 11/27/22  9:21 PM  Result Value Ref Range   Sodium 135 135 - 145 mmol/L   Potassium 3.8 3.5 - 5.1 mmol/L   Chloride 101 98 - 111 mmol/L   CO2 25 22 - 32 mmol/L   Glucose, Bld 105 (H) 70 - 99 mg/dL   BUN 18 6 - 20 mg/dL   Creatinine, Ser 9.21 0.44 - 1.00 mg/dL   Calcium  8.8 (L) 8.9 - 10.3 mg/dL   Total Protein 6.7 6.5 - 8.1 g/dL   Albumin 3.9 3.5 - 5.0 g/dL   AST 22  15 - 41 U/L   ALT 29 0 - 44 U/L   Alkaline Phosphatase 59 38 - 126 U/L   Total Bilirubin 0.7 0.3 - 1.2 mg/dL   GFR, Estimated >39 >39 mL/min   Anion gap 9 5 - 15  Lamotrigine  level   Collection Time: 11/27/22  9:21 PM  Result Value Ref Range   Lamotrigine  Lvl 5.9 2.0 - 20.0 ug/mL  hCG, serum, qualitative   Collection Time: 11/27/22  9:21 PM  Result Value Ref Range   Preg, Serum NEGATIVE NEGATIVE     Assessment and Plan   No diagnosis found. No orders of the defined types were placed in this encounter.  No orders of the defined types were placed in this encounter.   Assessment and Plan Assessment & Plan     Health Maintenance   Immunization History  Administered Date(s) Administered   Hepatitis B 04/08/1997   MMR 04/08/1984   Tdap 10/07/2014   Health Maintenance  Topic Date Due   COVID-19 Vaccine (1) Never done   Hepatitis B Vaccines 19-59 Average Risk (2 of 3 - 3-dose series) 05/06/1997   Cervical Cancer Screening (HPV/Pap Cotest)  Never done   Mammogram  Never done   Influenza Vaccine  08/06/2024 (Originally 12/08/2023)   DTaP/Tdap/Td (2 - Td or Tdap) 10/06/2024   HPV VACCINES (No Doses Required) Completed   Hepatitis C Screening  Completed   HIV Screening  Completed   Pneumococcal Vaccine  Aged Out   Meningococcal B Vaccine  Aged Out   Screening       04/02/2024   10:39 AM 07/30/2020    8:27 AM  Depression screen PHQ 2/9  Decreased Interest 2 1  Down, Depressed, Hopeless 2 3  PHQ - 2 Score 4 4  Altered sleeping 3 0  Tired, decreased energy 3 1  Change in appetite 3 3  Feeling bad or failure about yourself  2 1  Trouble concentrating 2 1  Moving slowly or fidgety/restless 1 0  Suicidal thoughts  0  PHQ-9 Score 18 10   Difficult doing work/chores Very difficult Somewhat difficult     Data saved with a previous flowsheet row definition      04/02/2024   10:39 AM  Fall Risk   Falls in the past year? 0    Functional Status Survey:     ***If perimenopausal or menopausal, add EWMRS  Preventive Health Recommendations   Sexual Health  Use condoms consistently to reduce STI risk  Choose partners carefully  Use effective contraception to prevent unintended pregnancy  Learn more: savingpics.uy  Tobacco &  Nicotine  Avoid smoking and all tobacco or nicotine products  Quit support available: https://smokefree.gov  Alcohol  Best option is no alcohol  If drinking, limit to <=7 drinks/week and <=3 drinks/occasion (women)  Higher intake increases health risks  Learn more: http://www.green.com/  Substance Use  Avoid illicit drug use  Confidential treatment resources available if needed  Help: identitylist.se  Nutrition & Weight Health (Obesity Medicine-Based)  Follow a whole-food, nutrient-dense eating pattern  Prioritize adequate protein to support muscle and metabolism  Emphasize vegetables, fruits, lean proteins, legumes, and healthy fats  Limit ultra-processed foods, added sugars, sugary beverages, and refined carbohydrates  Choose mostly unsaturated fats; limit saturated fats  Aim to meet fiber, calcium , potassium, and micronutrient needs through food  No single best diet--Mediterranean, DASH, and plant-forward patterns can all be effective when individualized  Learn more: tonerproviders.com.cy  Physical Activity  Engage in regular physical activity appropriate for your abilities  Include aerobic activity, strength training, and flexibility  Progress gradually and focus on consistency  Exercise guidance: https://www.acsm.org/education-resources/trending-topics-resources/physical-activity-guidelines  Injury & Home Safety  Use seat belts and appropriate helmets  Maintain working smoke detectors  Avoid smoking near bedding or upholstered furniture  Oral Health  Brush teeth twice daily and floss regularly  Maintain routine dental  visits  Learn more: carpetlickers.tn  Follow-Up  Next preventive physical recommended in 1 year  [name], DO, MS, FAAFP, Dipl. [facility] [address] [place], [address] Dept: [phone] Dept Fax: [phone]     [1]  Outpatient Medications Prior to Visit  Medication Sig   Ascorbic Acid (VITAMIN C PO) Take 1,700 mg by mouth daily.   Docusate Sodium  (DSS) 100 MG CAPS Take 100 mg by mouth.   ferrous sulfate ER (IRON SLOW RELEASE) 142 (45 Fe) MG TBCR tablet Take 45 mg by mouth daily.   lamoTRIgine  (LAMICTAL ) 150 MG tablet Take 1 tablet (150 mg total) by mouth 2 (two) times daily AND 0.5 tablets (75 mg total) daily with lunch.   Lisdexamfetamine  Dimesylate (VYVANSE ) 60 MG CHEW Chew 1 tablet (60 mg total) by mouth daily.   melatonin 1 MG TABS tablet Take 2 mg by mouth. (Patient taking differently: Take 2 mg by mouth at bedtime as needed.)   Multiple Vitamin (MULTI VITAMIN PO) Take 1 tablet by mouth daily. Bariatric mutlti vitamin   ondansetron  (ZOFRAN -ODT) 4 MG disintegrating tablet Take 1 tablet (4 mg total) by mouth every 8 (eight) hours as needed for nausea or vomiting.   tirzepatide  (ZEPBOUND ) 2.5 MG/0.5ML Pen Inject 2.5 mg into the skin once a week.   senna (SENOKOT) 8.6 MG tablet Take 2 tablets by mouth 2 (two) times daily.   No facility-administered medications prior to visit.  [2]  Allergies Allergen Reactions   Nsaids     Post gastric bypass   "

## 2024-06-19 ENCOUNTER — Ambulatory Visit: Admitting: Neurology

## 2024-07-11 ENCOUNTER — Encounter: Admitting: Family Medicine
